# Patient Record
Sex: Male | Born: 1945
Health system: Southern US, Community
[De-identification: ages and names within clinical notes are randomized; demographics above are authoritative.]

## PROBLEM LIST (undated history)

## (undated) DIAGNOSIS — E785 Hyperlipidemia, unspecified: Secondary | ICD-10-CM

## (undated) DIAGNOSIS — M2550 Pain in unspecified joint: Secondary | ICD-10-CM

## (undated) DIAGNOSIS — I10 Essential (primary) hypertension: Secondary | ICD-10-CM

## (undated) DIAGNOSIS — M1990 Unspecified osteoarthritis, unspecified site: Secondary | ICD-10-CM

## (undated) DIAGNOSIS — H269 Unspecified cataract: Secondary | ICD-10-CM

## (undated) DIAGNOSIS — N4289 Other specified disorders of prostate: Secondary | ICD-10-CM

## (undated) DIAGNOSIS — M109 Gout, unspecified: Secondary | ICD-10-CM

## (undated) DIAGNOSIS — N189 Chronic kidney disease, unspecified: Secondary | ICD-10-CM

## (undated) DIAGNOSIS — E119 Type 2 diabetes mellitus without complications: Secondary | ICD-10-CM

## (undated) HISTORY — DX: Essential (primary) hypertension: I10

## (undated) HISTORY — DX: Gout, unspecified: M10.9

## (undated) HISTORY — PX: OTHER SURGICAL HISTORY: SHX169

## (undated) HISTORY — DX: Type 2 diabetes mellitus without complications: E11.9

## (undated) HISTORY — PX: HERNIA REPAIR: SHX51

---

## 1988-08-01 HISTORY — PX: LUMBAR FUSION: SHX111

## 1988-08-01 HISTORY — PX: REPLACEMENT TOTAL KNEE BILATERAL: SUR1225

## 1998-06-20 ENCOUNTER — Emergency Department (HOSPITAL_COMMUNITY): Admission: EM | Admit: 1998-06-20 | Discharge: 1998-06-20 | Payer: Self-pay | Admitting: Emergency Medicine

## 1998-08-14 ENCOUNTER — Encounter: Payer: Self-pay | Admitting: Orthopedic Surgery

## 1998-08-15 ENCOUNTER — Inpatient Hospital Stay (HOSPITAL_COMMUNITY): Admission: RE | Admit: 1998-08-15 | Discharge: 1998-08-17 | Payer: Self-pay | Admitting: Orthopedic Surgery

## 1998-10-08 ENCOUNTER — Emergency Department (HOSPITAL_COMMUNITY): Admission: EM | Admit: 1998-10-08 | Discharge: 1998-10-09 | Payer: Self-pay | Admitting: Emergency Medicine

## 1998-11-01 ENCOUNTER — Encounter: Payer: Self-pay | Admitting: Emergency Medicine

## 1998-11-01 ENCOUNTER — Emergency Department (HOSPITAL_COMMUNITY): Admission: EM | Admit: 1998-11-01 | Discharge: 1998-11-01 | Payer: Self-pay | Admitting: Emergency Medicine

## 1998-11-23 ENCOUNTER — Encounter: Payer: Self-pay | Admitting: Orthopedic Surgery

## 1998-11-27 ENCOUNTER — Encounter: Payer: Self-pay | Admitting: Orthopedic Surgery

## 1998-11-27 ENCOUNTER — Inpatient Hospital Stay (HOSPITAL_COMMUNITY): Admission: RE | Admit: 1998-11-27 | Discharge: 1998-12-02 | Payer: Self-pay | Admitting: Orthopedic Surgery

## 1998-12-01 ENCOUNTER — Encounter: Payer: Self-pay | Admitting: Orthopedic Surgery

## 1999-01-15 ENCOUNTER — Encounter: Payer: Self-pay | Admitting: Internal Medicine

## 1999-01-15 ENCOUNTER — Emergency Department (HOSPITAL_COMMUNITY): Admission: EM | Admit: 1999-01-15 | Discharge: 1999-01-15 | Payer: Self-pay | Admitting: Internal Medicine

## 1999-02-17 ENCOUNTER — Encounter: Payer: Self-pay | Admitting: Orthopedic Surgery

## 1999-02-25 ENCOUNTER — Encounter: Payer: Self-pay | Admitting: Orthopedic Surgery

## 1999-02-25 ENCOUNTER — Inpatient Hospital Stay (HOSPITAL_COMMUNITY): Admission: RE | Admit: 1999-02-25 | Discharge: 1999-03-03 | Payer: Self-pay | Admitting: Orthopedic Surgery

## 2001-03-27 ENCOUNTER — Emergency Department (HOSPITAL_COMMUNITY): Admission: EM | Admit: 2001-03-27 | Discharge: 2001-03-27 | Payer: Self-pay | Admitting: *Deleted

## 2001-04-08 ENCOUNTER — Emergency Department (HOSPITAL_COMMUNITY): Admission: EM | Admit: 2001-04-08 | Discharge: 2001-04-08 | Payer: Self-pay | Admitting: Internal Medicine

## 2002-06-08 ENCOUNTER — Encounter: Payer: Self-pay | Admitting: Family Medicine

## 2002-06-08 ENCOUNTER — Ambulatory Visit (HOSPITAL_COMMUNITY): Admission: RE | Admit: 2002-06-08 | Discharge: 2002-06-08 | Payer: Self-pay | Admitting: Family Medicine

## 2003-01-02 ENCOUNTER — Ambulatory Visit (HOSPITAL_COMMUNITY): Admission: RE | Admit: 2003-01-02 | Discharge: 2003-01-02 | Payer: Self-pay | Admitting: Family Medicine

## 2003-01-02 ENCOUNTER — Encounter: Payer: Self-pay | Admitting: Family Medicine

## 2003-01-30 ENCOUNTER — Ambulatory Visit (HOSPITAL_COMMUNITY): Admission: RE | Admit: 2003-01-30 | Discharge: 2003-01-30 | Payer: Self-pay | Admitting: Internal Medicine

## 2003-01-30 ENCOUNTER — Encounter: Payer: Self-pay | Admitting: Internal Medicine

## 2003-03-05 ENCOUNTER — Encounter: Payer: Self-pay | Admitting: Neurosurgery

## 2003-03-05 ENCOUNTER — Encounter: Admission: RE | Admit: 2003-03-05 | Discharge: 2003-03-05 | Payer: Self-pay | Admitting: Neurosurgery

## 2003-05-26 ENCOUNTER — Encounter
Admission: RE | Admit: 2003-05-26 | Discharge: 2003-08-24 | Payer: Self-pay | Admitting: Physical Medicine & Rehabilitation

## 2003-06-05 ENCOUNTER — Encounter (HOSPITAL_COMMUNITY)
Admission: RE | Admit: 2003-06-05 | Discharge: 2003-07-05 | Payer: Self-pay | Admitting: Physical Medicine & Rehabilitation

## 2003-07-10 ENCOUNTER — Ambulatory Visit (HOSPITAL_COMMUNITY)
Admission: RE | Admit: 2003-07-10 | Discharge: 2003-07-10 | Payer: Self-pay | Admitting: Physical Medicine & Rehabilitation

## 2003-10-09 ENCOUNTER — Encounter
Admission: RE | Admit: 2003-10-09 | Discharge: 2004-01-07 | Payer: Self-pay | Admitting: Physical Medicine & Rehabilitation

## 2003-12-29 ENCOUNTER — Emergency Department (HOSPITAL_COMMUNITY): Admission: EM | Admit: 2003-12-29 | Discharge: 2003-12-29 | Payer: Self-pay | Admitting: Emergency Medicine

## 2004-01-12 ENCOUNTER — Encounter
Admission: RE | Admit: 2004-01-12 | Discharge: 2004-04-11 | Payer: Self-pay | Admitting: Physical Medicine & Rehabilitation

## 2004-02-27 ENCOUNTER — Emergency Department (HOSPITAL_COMMUNITY): Admission: EM | Admit: 2004-02-27 | Discharge: 2004-02-27 | Payer: Self-pay | Admitting: Emergency Medicine

## 2004-04-19 ENCOUNTER — Encounter
Admission: RE | Admit: 2004-04-19 | Discharge: 2004-07-18 | Payer: Self-pay | Admitting: Physical Medicine & Rehabilitation

## 2004-04-20 ENCOUNTER — Ambulatory Visit: Payer: Self-pay | Admitting: Physical Medicine & Rehabilitation

## 2008-10-03 ENCOUNTER — Ambulatory Visit (HOSPITAL_COMMUNITY): Admission: RE | Admit: 2008-10-03 | Discharge: 2008-10-03 | Payer: Self-pay | Admitting: Urology

## 2008-10-03 ENCOUNTER — Encounter (INDEPENDENT_AMBULATORY_CARE_PROVIDER_SITE_OTHER): Payer: Self-pay | Admitting: Urology

## 2010-11-11 LAB — CBC
HCT: 39.1 % (ref 39.0–52.0)
Hemoglobin: 13.4 g/dL (ref 13.0–17.0)
MCHC: 34.1 g/dL (ref 30.0–36.0)
MCV: 84.6 fL (ref 78.0–100.0)
Platelets: 357 10*3/uL (ref 150–400)
RBC: 4.63 MIL/uL (ref 4.22–5.81)
RDW: 14.1 % (ref 11.5–15.5)
WBC: 6.8 10*3/uL (ref 4.0–10.5)

## 2010-11-11 LAB — URINALYSIS, ROUTINE W REFLEX MICROSCOPIC
Bilirubin Urine: NEGATIVE
Glucose, UA: NEGATIVE mg/dL
Hgb urine dipstick: NEGATIVE
Ketones, ur: NEGATIVE mg/dL
Nitrite: NEGATIVE
Protein, ur: NEGATIVE mg/dL
Specific Gravity, Urine: 1.03 (ref 1.005–1.030)
Urobilinogen, UA: 0.2 mg/dL (ref 0.0–1.0)
pH: 6 (ref 5.0–8.0)

## 2010-11-11 LAB — DIFFERENTIAL
Basophils Absolute: 0 10*3/uL (ref 0.0–0.1)
Basophils Relative: 1 % (ref 0–1)
Eosinophils Absolute: 0.1 10*3/uL (ref 0.0–0.7)
Eosinophils Relative: 2 % (ref 0–5)
Lymphocytes Relative: 17 % (ref 12–46)
Lymphs Abs: 1.2 10*3/uL (ref 0.7–4.0)
Monocytes Absolute: 0.5 10*3/uL (ref 0.1–1.0)
Monocytes Relative: 7 % (ref 3–12)
Neutro Abs: 5 10*3/uL (ref 1.7–7.7)
Neutrophils Relative %: 74 % (ref 43–77)

## 2010-11-11 LAB — BASIC METABOLIC PANEL
BUN: 21 mg/dL (ref 6–23)
CO2: 28 mEq/L (ref 19–32)
Calcium: 9 mg/dL (ref 8.4–10.5)
Chloride: 101 mEq/L (ref 96–112)
Creatinine, Ser: 0.93 mg/dL (ref 0.4–1.5)
GFR calc Af Amer: >60 mL/min (ref >60)
GFR calc non Af Amer: >60 mL/min (ref >60)
Glucose, Bld: 199 mg/dL — ABNORMAL HIGH (ref 70–99)
Potassium: 3.4 mEq/L — ABNORMAL LOW (ref 3.5–5.1)
Sodium: 141 mEq/L (ref 135–145)

## 2010-11-11 LAB — GLUCOSE, CAPILLARY: Glucose-Capillary: 96 mg/dL (ref 70–99)

## 2010-12-14 NOTE — Consult Note (Signed)
NAME:  ULMER, DEGEN NO.:  [id number]   MEDICAL RECORD NO.:  [id number]          PATIENT TYPE:  AMB   LOCATION:  DAY                           FACILITY:  [facility]   PHYSICIAN:  Ky Barban, M.D.DATE OF BIRTH:  1945/11/21   DATE OF CONSULTATION:  DATE OF DISCHARGE:                                 CONSULTATION   CHIEF COMPLAINT:  Elevated PSA.   Mr. Spickler is 65 years old gentleman being followed since December of  last year.  He has BPH with bladder neck obstruction.  Although he does  not have any symptoms of prostatism a cystoscopy was done.  He has  enlarged prostate with a slow urinary stream so I put him on Flomax,  repeated his PSA.  His PSA keeps rising, now it has gone up to 7.1.  Free PSA is only 10%.  I tried to do a biopsy in the office but he has  very tight anal sphincter, very uncomfortable, so I advised him to  undergo a prostate biopsy under anesthesia in the hospital for which he  is coming as outpatient.  I have explained the procedure limitation  complications, he understands.   PAST MEDICAL HISTORY:  Negative.   FAMILY HISTORY:  No history of prostate cancer.   PERSONAL HISTORY:  Does not smoke or drink.   REVIEW OF SYSTEMS:  Unremarkable.   EXAMINATION:  Blood pressure 134/84, temperature is normal.  CENTRAL NERVOUS SYSTEM:  Negative.  HEAD, NECK, ENT:  Negative.  CHEST:  Symmetrical.  HEART:  Regular sinus rhythm.  ABDOMEN:  Soft, flat.  Liver, spleen, kidneys are not palpable.  EXTERNAL GENITALIA:  Uncircumcised, meatus is adequate.  Testicles are  normal.  RECTAL:  Normal sphincter tone, no rectal mass, prostate 1 and 1/2 +,  smooth and firm.   IMPRESSION:  1. Elevated prostate-specific antigen.  2. Benign prostatic hypertrophy.   PLAN:  Transrectal needle biopsy of the prostate under anesthesia as  outpatient.      Ky Barban, M.D.  Electronically Signed     MIJ/MEDQ  D:  10/02/2008  T:  10/02/2008   Job:  [id number]

## 2010-12-14 NOTE — Op Note (Signed)
NAME:  Kevin Hooper, Kevin Hooper              ACCOUNT NO.:  [id number]   MEDICAL RECORD NO.:  [id number]          PATIENT TYPE:  AMB   LOCATION:  DAY                           FACILITY:  [facility]   PHYSICIAN:  Ky Barban, M.D.DATE OF BIRTH:  [birth year]   DATE OF PROCEDURE:  10/03/2008  DATE OF DISCHARGE:                               OPERATIVE REPORT   PREOPERATIVE DIAGNOSIS:  Elevated prostate-specific antigen.   POSTOPERATIVE DIAGNOSIS:  Elevated prostate-specific antigen.   PROCEDURE:  Transrectal multiple needle biopsy of the prostate.   ANESTHESIA:  General.   DESCRIPTION OF THE PROCEDURE:  The patient under general anesthesia in  lithotomy position with usual prep and drape.  A digital rectal  examination was done.  Prostate feels smooth and firm.  Then using a Tru-  Cut biopsy needle multiple biopsies from both sides of the prostate are  done.  No complication.  The patient left the operating room in  satisfactory condition.      Ky Barban, M.D.  Electronically Signed     MIJ/MEDQ  D:  10/03/2008  T:  10/04/2008  Job:  [id number]

## 2010-12-17 NOTE — Assessment & Plan Note (Signed)
MEDICAL RECORD NUMBER:  #[id number].   DATE OF BIRTH:  [birth year].   HISTORY OF PRESENT ILLNESS:  This 65 year old male with history of  rheumatoid arthritis previously on methotrexate who discontinued this on his  own because he did not want to have to avoid the sun while being on it.  He  has a history of bilateral knee replacements.  He has had recent risk  fracture on the right upper extremity, but this is healing.  He has major  complaints of bilateral wrist pain as well as bilateral foot pain.  His  wrist pain is more towards the ulnar stylus area.  His foot pain is more  lateral malleolus on the right and on the clavicular area on the left.   His pain is rated 9/10.   CURRENT MEDICATIONS:  1.  Allegra.  2.  Toprol.  3.  Flexeril.  4.  Arthrotec which he takes b.i.d.  5.  Nortriptyline 10 q.h.s. which has not helped her with sleep thus far.  6.  Ultracet 1 p.o. b.i.d.  7.  Lidoderm patch which he puts on painful areas as directed, on 12, off      12.   SOCIAL HISTORY:  Accompanied by wife.  No illicit drug use since going  through detox last summer.  My last UDS showed morphine as expected on  Avinza.  He was on that at the time, but no other illicit drugs.  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   REVIEW OF SYSTEMS:  Anxiety, depression, poor sleep, agitation, spasms in  his left hand and right leg.   PHYSICAL EXAMINATION:  VITAL SIGNS:  Blood pressure 143/82, pulse 93,  respirations 16, O2 saturation 98% on room air.  GENERAL APPEARANCE:  Gait is with a limp.  Affect is alert, appearance is  normal.  EXTREMITIES:  Wrist has minor degree of swelling and there is no erythema.  He does smell of Ben-Gay which he has applied liberally.   His knees show no evidence of swelling, his feet show no evidence of  swelling except that the right lateral malleolus has pain with passive  inversion of the foot.  He has  full strength in bilateral upper and lower  extremities.  His gait shows no evidence of toe drag or knee instability.  His fingers PIT, DIT and MCP showed no evidence of swelling.  He has no  joint deformities at this time other than the swelling in the wrist.  No  rheumatoid nodules appreciated.  Feet have no joint deformities that I can  appreciate.  He has no evidence of skin rash.   IMPRESSION:  1.  Rheumatoid arthritis with polyarthritis causing pain, mainly in      bilateral wrist at this time, and left mid foot area.  2.  Right ankle sprain.  Does not recall a specific injury, but this looks      like a sprain rather then joint swelling.  3.  History of right wrist fracture last summer.  This appears to have      healed.  4.  Pain management noted, will try to manage him without narcotics, i.e.,      Ultram and Arthrotec.  Will increase his Arthrotec t.i.d..  Will switch      him from the Ultracet to the Ultram at 50 mg two p.o. t.i.d. and      continue his Lidoderm patch.  We  may need to eventually put him back on      Avinza, but I would first like to see him evaluated by Rheumatology and      to see if there is any __________ agents that may be appropriate at this      time and only start him on stronger narcotic analgesics, if he has pain,      despite appropriate rheumatoid arthritis therapy.   PLAN:  1.  Will refer him to [facility].  He states that the      rheumatologist that used to see him here in [place] does not take      Medicaid anymore, that is Dr. Corliss Skains at [facility].  2.  I will see him back in two months.       AEK/MedQ  D:  05/18/2004 10:44:33  T:  05/18/2004 12:38:25  Job #:  [id number]   cc:   Kerry Kass, M.D.  [facility]   [address]  [facility] [place]  [place] [address]  Fax: [phone]

## 2010-12-17 NOTE — Assessment & Plan Note (Signed)
A 65 year old male with onset of neck in 1980s, last seen by me July 03, 2003. He has done a bit better since we increased his nortriptyline to 25  q.h.s. in terms of sleep. He can really tell when he does not take the  Arthrotec and does relatively well when he takes 75/200 one p.o. b.i.d. He  did increase on his Tylox 5/500 to q.i.d. per my order, and he has been on  the Flexeril 10 mg t.i.d.   He has had no further flank pain of any magnitude. Has had current twinges  in his low back area. He has not followed up with urology.   We reviewed his nuclear medicine bone scan which really showed no  abnormalities other than his total knees.   He continues to have right fifth digit numbness after hitting his elbow on  something about two to three months ago.   REVIEW OF SYSTEMS:  No bowel or bladder incontinence. Has not passed stones.   SOCIAL HISTORY:  Married. 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   Current pain level is 4/10, going up to 7/10 at times. Pain interference  score:  General activity 8, mood not assessed, walking ability 6, __________  7, relationship to other people 10, sleep 7, generalized 6. Pain areas  include left side of the neck, posterior neck, bilateral knees, feet, and  right hand which is more of a numbness.   PAST MEDICAL HISTORY:  High blood pressure, depression, reflux, heartburn.   PHYSICAL EXAMINATION:  VITAL SIGNS:  Blood pressure 139/101, pulse 98, O2  saturation 98% on room air.  GENERAL:  In no acute distress. Mood and affect appropriate but appears a  bit down.  MUSCULOSKELETAL:  Lower strength 5/5 bilateral upper and lower extremities.  Sensation is decreased, right fifth digit. His neck has full forward  flexion, and extension is approximately 25%, accompanied by increased pain,  end-range pain also with forward flexion, lateral bending, and rotation of  25% as well. His low back has 50% flexion, 25%  extension; extension once  again is more painful than flexion. His deep tendon reflexes are absent in  bilaterally knees, but he is status post bilateral total knee; 2+ bilateral  ankle, biceps, triceps, brachial radialis. He has full range of motion in  bilateral upper and lower extremities.   IMPRESSION:  1. Cervicalgia, cervical spondylosis without myelopathy. He may have some     cervical facet arthropathy as well.  2. Lumbar pain. May have facet arthropathy as well based on his pain with     extension.  3. Normal nuclear medicine bone scan.   PLAN:  1. Will continue current medications.  2. Discussed possibility of neck injections. He thinks he is doing     relatively well, so he would like to wait for a couple more months and     see how he does as he increases work activity.  3. I will see him back in two months.  4. Continue current medications which includes Tylox 5/500 one p.o. q.i.d.,     Flexeril 10 mg t.i.d., and Arthrotec 75/200 one p.o. b.i.d.      Erick Colace, M.D.   AEK/MedQ  D:  08/12/2003 09:58:12  T:  08/12/2003 10:44:53  Job #:  [id number]   cc:   Madelin Rear. Sherwood Gambler, M.D.  [address]  [place]  [place] [address]  Fax: [phone]   Reinaldo Meeker, M.D.  [address]  [place]  [place] [address]  Fax: [phone]

## 2010-12-17 NOTE — Assessment & Plan Note (Signed)
A 65 year old male with chronic neck pain.  He was last seen by me on  August 12, 2003.  Sleep wise he is doing well with his nortriptyline 25 mg.  He continues on Tylox one p.o. q.i.d. and has done relatively well with  this.  Certainly he is able to work full time.  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  He brings firewood from outside to  inside to his home.   He has had no further flank pain.  He has had no new medical problems in the  intervening time.   MEDICATIONS:  1. Allegra 180 mg one p.o. daily.  2. Toprol XL 50 mg daily.  3. Flexeril 10 mg b.i.d.  4. Arthrotec 75/200 mg one p.o. b.i.d.  5. Nexium 40 mg p.o. daily.  6. __________ 5/500 mg one p.o. q.i.d.  7. Nortriptyline 10 mg p.o. q.h.s.   REVIEW OF SYSTEMS:  No bowel or bladder incontinence.  Positive for high  blood pressure.   PAST MEDICAL HISTORY:  Significant for:  1. Rheumatoid arthritis.  2. Kidney stones.  3. Hypertension.  He is on Toprol for that.   ALLERGIES:  None known.   PHYSICAL EXAMINATION:  The blood pressure is 149/106, pulse 101, respiratory  rate 16, O2 saturation 97% on room air.  Gait is stiff when he first gets  up.  No __________ affect and appearance normal.   The back has no tenderness to palpation.  The neck has no tenderness to  palpation.  He has forward flexion to approximately 50%, lateral rotation  and bending 50%, and extension about 50%, which is improved compared to last  visit.  He has decreased sensation in the right fifth digit, otherwise  intact bilateral upper extremities.  Deep tendon reflexes are 2 in bilateral  ankles, biceps, triceps, and brachial radialis.  He is status post bilateral  total knees with 0/5 reflexes there.  He has some stiffness in his  shoulders, but is able to get full range of motion on external and internal  rotation, but he does this very slowly.  He has no pain to palpation in the  upper extremities.  He has normal  strength in bilateral upper and lower  extremities.   IMPRESSION:  1. Cervicalgia, cervical spondylosis without myelopathy, and some cervical     facet arthropathy.  This could be related to his rheumatoid arthritis.  2. Lumbar facet arthropathy.  This is most likely degenerative joint     disease.  3. Right ulnar neuropathy due to contusing of his right elbow several months     ago.  He states it is generally getting better.   RECOMMENDATIONS:  I have reviewed his pain medications with him and have  recommended that his Tylox be changed to Avinza 30 mg p.o. daily.  This  should improve his efficacy based on a more smooth relief pattern.  Will  also continue his Flexeril 10 mg t.i.d. and Arthrotec 75/200 mg one p.o.  b.i.d.  He should not be on any other nonsteroidal anti-inflammatories.   I will see him back in one month to review medications.  No need for neck  injection at this point.      Erick Colace, M.D.   AEK/MedQ  D:  10/13/2003 17:31:51  T:  10/13/2003 20:04:01  Job #:  [id number]   cc:   Madelin Rear. Sherwood Gambler, M.D.  [address]  [place]  [place] [address]  Fax:  [phone]   Reinaldo Meeker, M.D.  [address]  [place]  [place] [address]  Fax: [phone]

## 2010-12-17 NOTE — Assessment & Plan Note (Signed)
This is a 65 year old male, onset of neck pain in the 78s.  Last seen by  me on May 27, 2003.  He also has had low back stiffness.  We stopped the  Soma, discontinued the Celebrex, maintained him on Arthrotec and Flexeril,  and he does not remember his Arthrotec dose.  I do not have it written down.  He got this from another doctor, but he is now out of it.  He has been on  oxycodone 5 mg three to four times a day.  Nortriptyline started 10 q.h.s.  This has been partially helpful.   INTERVAL HISTORY:  Positive for what sounds like kidney stones.  He had left  flank pain.  States that he has had kidney stones twice in the past.  He has  had what sounds like abdominal ultrasound or kidney ultrasound as well as CT  of his abdomen and pelvis.  He is not  sure whether he saw a urologist, but  he would call back to double check on this.  He has also in the intervening  time had some right fifth digit numbness, and he remembers hitting his elbow  on something.   REVIEW OF SYSTEMS:  No bowel or bladder incontinence.  Has not passed a  stone.  Has been straining his urines.   SOCIAL HISTORY:  He is married.  Not working.  Last worked about a year ago.   EXAM:  VITAL SIGNS:  Blood pressure 124/87, pulse 92, O2 saturation 97%.  NEUROLOGIC:  Gait is antalgic.  Affect is alert.  Appearance is normal.  Motor strength is 5/5 bilateral upper and lower extremities.  Sensation is  reduced right fifth digit compared to the left.  Back has some tenderness  along the left 12th rib.  Some tenderness inferior to this.  Some pain when  leaning towards the right side.  His overall supine range of motion is  approximately 50% on forward flexion and extension, lateral rotation, and  bending.   IMPRESSION:  1. Cervical spondylosis and chronic neck pain, improved with some physical     therapy.  2. Low back stiffness with increased pain left flank.  Question if this     renal calculi.  He does have some  tenderness right over the 12th rib.     Will need to investigate this further with a bone scan.   PLAN:  1. Will increase his nortriptyline 25 q.h.s.  2. Tylox 5/500 three times daily to q.i.d.  3  Arthrotec 75/200, 1 p.o. b.i.d.  4  Flexeril 10 mg p.o. three times daily.  1. Continue physical therapy.  2. Encouraged the patient to follow up with urology in regards to left flank     pain.   Consider conversion to longer-acting narcotic analgesic depending on how he  does the next time I see him.  Consider trigger points if neurologic workup  negative and bone scan is negative.      Erick Colace, M.D.   AEK/MedQ  D:  07/03/2003 17:59:24  T:  07/04/2003 05:20:10  Job #:  [id number]

## 2010-12-17 NOTE — Assessment & Plan Note (Signed)
This is a 65 year old male with rheumatoid arthritis and chronic neck pain.  He was last seen by me on October 13, 2003.  At that time I switched him from  Tylox to Avinza.  He does not note any significant improvement with the  switch from Tylox 5/500 q.i.d. to Avinza 30 daily.  He does complain of his  joints aching in his hands as well as some neck pain and bilateral foot  pain.  He has had severe arthritic pain in the past in his feet and has  taken methotrexate for this but has not taken any of this for a while.  Regarding his hand pain he points to the carpometacarpal joints bilaterally.  He rates his pain averaging 5 out of 10 going from 4 to 8.   PAST SURGICAL HISTORY:  Left knee replacement August 09, 1999.  Right knee  replacement January 09, 2000.  Lower back surgery in September of 2001.   Other past medical history is cervical spondylosis without significant  instability.  Also a history of costochondritis.   His last physical therapy was in December, 2004 which has helped loosen up  his neck.   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   CURRENT MEDICATIONS:  Allegra 180 p.o. daily, Toprol XL 50 mg p.o. daily,  Flexeril 10 p.o. b.i.d., Arthrotec 75/200 b.i.d., Nexium 40 mg p.o.,  nortriptyline 25 p.o. q.h.s., Avinza 30 mg p.o. q.a.m.,   REVIEW OF SYSTEMS:  No suicidal thoughts.  He is sleeping okay.   SOCIAL HISTORY:  Smokes.  Married, lives with his wife.   PHYSICAL EXAMINATION:  VITAL SIGNS: Blood pressure 138/100, pulse 60,  respiratory rate 16, O2 saturation 97% on room air.  GENERAL:  Gait is of sort of a shuffle when he first gets up.  Alert,  appears normal.  No acute distress, affect appropriate.  NECK:  Has a 35% range forward flexion and 50% extension, 50% lateral  bending and rotation.  MUSCULOSKELETAL:  The back has no tenderness to palpation of the lumbar  spine.  Borderline cervical paraspinal's and upper trapezius.  Deep tendon  reflexes are 3+ bilaterally in upper and lower extremities with exception of  ankles which are 1+.  His strength is 5/5 bilaterally deltoid as well as hip  flexion and knee extension, ankle dorsiflexion.  He has no tenderness to  palpation of his shoulders, elbows or wrists.  No joint swelling in his  hands.  Bilateral knees have valgus deformities.  He is status post total  knees bilaterally and does have some compensatory valgus at the ankles.  Gait is without evidence of toe dragging, knee instability.   IMPRESSION:  1. Cervicalgia.  2. More diffuse arthritic pain, may be related to his rheumatoid versus     superimposed osteoarthritis.  3. Lumbar facet arthropathy.   PLAN:  1. Will start on a Prednisone taper, start out with 40 working down to 10     over the course of 12 days.  He will hold his Arthrotec during that time.  2. Continue Avinza but add on Ultracet 1 p.o. b.i.d. for break through.   PLAN:  1. I will see him back in approximately 1 month to assess medications and     decide whether to increase Avinza if is still not having adequate pain     relief versus going back to Tylox.  2. Continue Flexeril 10 t.i.d.  3. Restart Arthrotec after done with Prednisone.  Erick Colace, M.D.   AEK/MedQ  D:  11/06/2003 17:18:36  T:  11/06/2003 18:31:29  Job #:  [id number]   cc:   Madelin Rear. Sherwood Gambler, M.D.  [address]  [place]  [place] [address]  Fax: [phone]   Reinaldo Meeker, M.D.  [address]  [place]  [place] [address]  Fax: [phone]
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
## 2010-12-17 NOTE — Assessment & Plan Note (Signed)
REASON FOR VISIT:  A 65 year old male with rheumatoid arthritis and chronic  neck pain.  He has been on Avinza and ran out a couple of days ago, had some  diarrhea along with this, but no other withdrawal symptoms.  He did notice  an increase in his pain after he came off the Avinza.  Last visit we put him  on a prednisone burst and taper, 12-day, held his Arthrotec during that  time.  The patient did not restart it in the interval time.  We did add  Ultracet one p.o. b.i.d. for breakthrough which has been minimally helpful.   He complains of left and right foot pain at the current time, notes a left  great toe injury last year.  He states that his left toe has been swollen as  well.   No new medical issues in the interval time.   PAST SURGICAL HISTORY:  1. Left knee replacement in 2001 and right knee replacement in 2001, 6     months apart.  2. Lower back surgery September 2001.   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   CURRENT MEDICATIONS:  1. Avinza 30 q.a.m. - ran out 2 days ago.  2. Arthrotec 75/200 b.i.d. - did not restart this after prednisone.  3. Ultracet one p.o. b.i.d.  4. Flexeril 10 p.o. b.i.d.  5. Toprol-XL 50 p.o. daily.  6. Allegra 180 p.o. daily.   REVIEW OF SYSTEMS:  Poor sleep.  No suicidal thoughts.  Chest pain.  History  of costochondritis.  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  His pain is averaging 6/10.  Pain  diagram shows right hand, left knee, right foot - though he mentions his  left toe as well, low back.   EXAMINATION:  VITAL SIGNS:  Blood pressure 133/62, pulse 76, O2 saturation  96% on room air.  GENERAL:  No acute distress, mood and affect appropriate.  MUSCULOSKELETAL:  Motor strength is 5/5 bilateral deltoid, biceps, triceps,  grip, as well as hip flexion, knee extension, and ankle dorsiflexion.  He  has good range of motion at the hips, ankles, and knees.  His right  foot has  good pulses, no skin breakdown, full range of motion, some tenderness at the  metatarsal heads.  Left foot has a swollen distal phalanx of the great toe.  His toenail is yellowish and cracked.  I can express purulent material from  underneath his toenail.  No odor to it.  He has no swelling proximal to the  DIP.  His pulses are good.   IMPRESSION:  1. Rheumatoid arthritis status post bilateral knee replacements, has some     arthritic pain right foot as well as the hand.  2. Left great toe infection, appears to be related to a toenail that had     trauma to it quite remotely.   RECOMMENDATIONS:  1. Will send him over to the foot center.  I believe he will need to have     debridement of his left great toe and likely some antibiotics though I     think the debridement is the most important at this point.  2. In terms of his pain management, his right foot has some metatarsalgia     and may benefit from Lidoderm patch.  3. Resume Arthrotec 75/200 b.i.d.  4. Increase Ultracet to two p.o. b.i.d.  5. Continue Avinza 30 mg p.o. daily.  6.  I have discussed referral to a rheumatologist.  He states he has not seen     one in the past but has been on methotrexate in the past.  He may benefit     from reevaluation but will hold off until he has treatment of his left     foot through the foot center.   I will see him back in 1 month.      Erick Colace, M.D.   AEK/MedQ  D:  12/09/2003 10:47:08  T:  12/09/2003 11:35:40  Job #:  [id number]

## 2010-12-17 NOTE — Assessment & Plan Note (Signed)
MEDICAL RECORD #[id number]   CHIEF COMPLAINT:  A 65 year old male with rheumatoid arthritis and chronic  neck pain.  Has been on Avinza with good result, ran out about 3 days ago,  had no significant withdrawal symptoms.  He did notice increase in pain  after he came off Avinza.  He was restarted on Arthrotec last visit.  Ultracet two p.o. b.i.d. were added for breakthrough pain.   INTERVAL HISTORY:  I asked him to go to the foot center to follow up on left  great toe swelling and some discharge under the nail.  He instead went to  the [facility] ED and received antibiotics and x-ray, stated it was fractured  although he does not recall actually injuring it except for last year.  He  was given a cast boot and as he reports, one dose of Vicodin in the ED; no  prescription given.   SOCIAL HISTORY:  Wife has been getting treatment for shoulder pain.  He is  restarting a job at the end of this month as a __________.   CURRENT MEDICATIONS:  1. Avinza 30 mg p.o. q.a.m.  2. Arthrotec 75/200 b.i.d.  3. Ultracet two p.o. b.i.d.  4. Flexeril 10 p.o. b.i.d.  5. Toprol-XL 50 mg daily.  6. Allegra 180 p.o. daily.   REVIEW OF SYSTEMS:  Getting over bronchitis.  His costochondritis has flared  up.  Pain averaging 4 but going up to 5/10.  This is without working the  last 2 months.   Review of systems:  Depression, poor sleep, reflux, heartburn, swelling in  legs.   EXAMINATION:  Blood pressure 130/95, pulse 91, respirations 18, O2  saturation 95% on room air.  Motor strength is 5/5 bilateral deltoid,  biceps, triceps, grip, as well as 4/5 hip flexion, 5 knee extension, 5 ankle  dorsiflexion.  His lower extremities show bilateral total knee scars.  Left  great toe shows no evidence of swelling, minor tenderness to palpation at  the MTP, no drainage.  His metatarsal heads are nonpainful to palpation.  He  has no dysvascular changes in his lower extremities.  Toes are warm.  His  back has no  tenderness to palpation.  He has good range of motion at the  hips.  Some crepitus at the knees.   IMPRESSION:  Rheumatoid arthritis with chronic neck as well as chronic  intermittent costochondritis and low back pain.   PLAN:  1. Will continue Avinza.  I have told him to call back if he runs out.  2. I went over the controlled substance agreement.  The patient not to get     controlled substances, even from ER while he is in the ER without     permission.  3. Continue Arthrotec 75/200 b.i.d.  4. Ultracet two p.o. b.i.d.  5. I will see him back in 1 month.  Consider rheumatology consult.    Erick Colace, M.D.   AEK/MedQ  D:  01/13/2004 10:33:03  T:  01/13/2004 11:44:30  Job #:  [id number]

## 2013-08-01 HISTORY — PX: COLON RESECTION: SHX5231

## 2016-04-28 ENCOUNTER — Ambulatory Visit (INDEPENDENT_AMBULATORY_CARE_PROVIDER_SITE_OTHER): Payer: Medicare Other | Admitting: Neurology

## 2016-04-28 ENCOUNTER — Encounter: Payer: Self-pay | Admitting: Neurology

## 2016-04-28 VITALS — BP 177/103 | HR 76 | Ht 68.5 in | Wt 204.5 lb

## 2016-04-28 DIAGNOSIS — R202 Paresthesia of skin: Secondary | ICD-10-CM | POA: Diagnosis not present

## 2016-04-28 NOTE — Patient Instructions (Signed)
   We will check MRI of the brain and a carotid doppler study to look a blood circulation to the head. Stay on the aspirin.

## 2016-04-28 NOTE — Progress Notes (Signed)
Reason for visit: Left sided numbness  Referring physician: Dr. Darryl Nestle is a 70 y.o. male  History of present illness:  Mr. Ruffolo is a 70 year old right-handed white male with a history of hypertension who comes to this office for an evaluation of onset of left-sided numbness that began 3 weeks ago. The patient has had some numbness in the hands that dates back several years, but he woke up one morning 3 weeks ago with numbness of the left face, arm, and leg. The numbness has persisted, he has also noted some mild gait instability since onset of the numbness. He has not had any falls. He denies any discomfort in the neck or low back, he has had prior lumbosacral spine surgery. The patient denies issues controlling the bowels or the bladder. He denies any true weakness of the extremities. He has not had any change in speech or swallowing, he denies headache, he denies any vision changes. He is on low-dose aspirin therapy. He was seen by his primary care physician who ordered MRI of the brain, but his insurance company denied the study. The patient is sent to this office for further evaluation.   Past Medical History:  Diagnosis Date  . Hypertension     Past Surgical History:  Procedure Laterality Date  . COLON RESECTION  2015  . LUMBAR FUSION  1990  . OTHER SURGICAL HISTORY Left    Arm s/p accident  . REPLACEMENT TOTAL KNEE BILATERAL Bilateral 1990    Family History  Problem Relation Age of Onset  . COPD Father   . Throat cancer Father   . Cancer Brother     Social history:  reports that he quit smoking about 24 years ago. He has never used smokeless tobacco. He reports that he drinks alcohol. He reports that he does not use drugs.  Medications:  Prior to Admission medications   Medication Sig Start Date End Date Taking? Authorizing Provider  albuterol (PROVENTIL HFA;VENTOLIN HFA) 108 (90 Base) MCG/ACT inhaler Inhale into the lungs every 6 (six) hours as  needed for wheezing or shortness of breath.   Yes Historical Provider, MD  aspirin EC 81 MG tablet Take 81 mg by mouth daily.   Yes Historical Provider, MD  lisinopril (PRINIVIL,ZESTRIL) 10 MG tablet Take 10 mg by mouth daily. 04/05/16  Yes Historical Provider, MD  metoprolol succinate (TOPROL-XL) 50 MG 24 hr tablet TAKE TWO (2) TABLETS BY MOUTH DAILY. 04/05/16  Yes Historical Provider, MD     No Known Allergies  ROS:  Out of a complete 14 system review of symptoms, the patient complains only of the following symptoms, and all other reviewed systems are negative.  Joint pain, achy muscles Numbness  Blood pressure (!) 177/103, pulse 76, height 5' 8.5" (1.74 m), weight 204 lb 8 oz (92.8 kg).  Physical Exam  General: The patient is alert and cooperative at the time of the examination. The patient is moderately obese.  Eyes: Pupils are equal, round, and reactive to light. Discs are flat bilaterally.  Neck: The neck is supple, no carotid bruits are noted.  Respiratory: The respiratory examination is clear.  Cardiovascular: The cardiovascular examination reveals a regular rate and rhythm, no obvious murmurs or rubs are noted.  Skin: Extremities are without significant edema.  Neurologic Exam  Mental status: The patient is alert and oriented x 3 at the time of the examination. The patient has apparent normal recent and remote memory, with an apparently normal  attention span and concentration ability.  Cranial nerves: Facial symmetry is present. There is good sensation of the face to pinprick and soft touch on the right, slightly decreased on the left. The strength of the facial muscles and the muscles to head turning and shoulder shrug are normal bilaterally. Speech is well enunciated, no aphasia or dysarthria is noted. Extraocular movements are full. Visual fields are full. The tongue is midline, and the patient has symmetric elevation of the soft palate. No obvious hearing deficits are  noted.  Motor: The motor testing reveals 5 over 5 strength of all 4 extremities. Good symmetric motor tone is noted throughout.  Sensory: Sensory testing is intact to pinprick, soft touch, vibration sensation, and position sense on all 4 extremities, with exception of some decrease in pinprick sensation on the left arm, some decrease in vibration sensation on the left leg.. No evidence of extinction is noted.  Coordination: Cerebellar testing reveals good finger-nose-finger and heel-to-shin bilaterally.  Gait and station: Gait is slightly wide-based. Tandem gait is unsteady. Romberg is negative. No drift is seen.  Reflexes: Deep tendon reflexes are symmetric and normal bilaterally. Toes are downgoing bilaterally.   Assessment/Plan:  1. Left-sided numbness, probable stroke event  The patient likely sustained a small thalamic stroke in the right brain. The patient will need to be evaluated for this issue. MRI of the brain will be ordered again, carotid Doppler study will be obtained. The patient will stay on aspirin. He will be contacted with the results of the above studies. If a stroke is evident, a 2-D echocardiogram also be done.  Jill Alexanders MD 04/28/2016 8:56 AM  [facility] [address] [place], [place] [address]  Phone [phone] Fax [phone]

## 2016-05-02 ENCOUNTER — Telehealth: Payer: Self-pay | Admitting: Neurology

## 2016-05-02 NOTE — Telephone Encounter (Signed)
Patient ready to be scheduled for Doppler No PA needed. Thanks [employer]

## 2016-05-11 ENCOUNTER — Ambulatory Visit (INDEPENDENT_AMBULATORY_CARE_PROVIDER_SITE_OTHER): Payer: Medicare Other

## 2016-05-11 DIAGNOSIS — R202 Paresthesia of skin: Secondary | ICD-10-CM

## 2016-05-18 ENCOUNTER — Telehealth: Payer: Self-pay | Admitting: *Deleted

## 2016-05-18 MED ORDER — GABAPENTIN 100 MG PO CAPS: 100.0000 mg | ORAL_CAPSULE | Freq: Three times a day (TID) | ORAL | 1 refills | Status: DC

## 2016-05-18 NOTE — Telephone Encounter (Signed)
I spoke to wife who had called for her husband.  He has been having L hip pain the last 3 wks, but pain has increased the last 2 days.  Is asking for referral to [facility] ( Dr. Gladstone Lighter) (has been to them before).  I relayed that Dr. Jannifer Franklin out of the office today.  She may also contact pcp (Dr. Manuella Ghazi) as well.  Pt is scheduled for MRI brain 05-30-16 for left leg numbness.

## 2016-05-18 NOTE — Telephone Encounter (Signed)
I called the patient. The left hip pain is part of the left sided numbness that he was seen for before. There is no hip pain with standing, it bothers him with sitting primarily with a numbness uncomfortable sensation. I will call in gabapentin, I do not think this is an orthopedic problem.

## 2016-05-19 ENCOUNTER — Telehealth: Payer: Self-pay | Admitting: Neurology

## 2016-05-19 NOTE — Telephone Encounter (Signed)
I called patient, talk with the wife, the carotid Doppler study was unremarkable.

## 2016-05-30 ENCOUNTER — Ambulatory Visit
Admission: RE | Admit: 2016-05-30 | Discharge: 2016-05-30 | Disposition: A | Discharge status: Home / Self Care | Payer: Medicare Other | Source: Ambulatory Visit | Attending: Neurology | Admitting: Neurology

## 2016-05-30 DIAGNOSIS — R202 Paresthesia of skin: Secondary | ICD-10-CM

## 2016-05-31 ENCOUNTER — Telehealth: Payer: Self-pay | Admitting: Neurology

## 2016-05-31 NOTE — Telephone Encounter (Signed)
I called the patient the MRI the brain shows minimal small vessel changes. There is no evidence of a stroke that would explain his current symptoms. We will go on to get the carotid Doppler study. He will remain on aspirin.    MRI brain 05/30/16:  IMPRESSION:  This MRI of the brain without contrast shows the following: 1.    There are no acute strokes noted. 2.     There are several microhemorrhages in the hemispheres and in the left cerebellar cerebellar hemisphere. This is a nonspecific finding and probably due to chronic microvascular ischemic change.   Early amyloid angiopathy cannot be ruled out. 3.    Mild age-related cortical atrophy.

## 2016-06-13 ENCOUNTER — Other Ambulatory Visit (HOSPITAL_COMMUNITY): Payer: Self-pay | Admitting: Orthopedic Surgery

## 2016-06-13 DIAGNOSIS — M5442 Lumbago with sciatica, left side: Secondary | ICD-10-CM

## 2016-06-17 ENCOUNTER — Ambulatory Visit (HOSPITAL_COMMUNITY)
Admission: RE | Admit: 2016-06-17 | Discharge: 2016-06-17 | Disposition: A | Discharge status: Home / Self Care | Payer: Medicare Other | Source: Ambulatory Visit | Attending: Orthopedic Surgery | Admitting: Orthopedic Surgery

## 2016-06-17 DIAGNOSIS — M4802 Spinal stenosis, cervical region: Secondary | ICD-10-CM | POA: Insufficient documentation

## 2016-06-17 DIAGNOSIS — M5442 Lumbago with sciatica, left side: Secondary | ICD-10-CM

## 2016-08-01 DIAGNOSIS — N4289 Other specified disorders of prostate: Secondary | ICD-10-CM

## 2016-08-01 HISTORY — DX: Other specified disorders of prostate: N42.89

## 2016-08-03 DIAGNOSIS — G5603 Carpal tunnel syndrome, bilateral upper limbs: Secondary | ICD-10-CM | POA: Diagnosis not present

## 2016-08-15 DIAGNOSIS — G5603 Carpal tunnel syndrome, bilateral upper limbs: Secondary | ICD-10-CM | POA: Diagnosis not present

## 2016-08-29 DIAGNOSIS — G5603 Carpal tunnel syndrome, bilateral upper limbs: Secondary | ICD-10-CM | POA: Diagnosis not present

## 2016-09-05 DIAGNOSIS — G5603 Carpal tunnel syndrome, bilateral upper limbs: Secondary | ICD-10-CM | POA: Diagnosis not present

## 2016-09-08 DIAGNOSIS — G5603 Carpal tunnel syndrome, bilateral upper limbs: Secondary | ICD-10-CM | POA: Diagnosis not present

## 2016-09-26 DIAGNOSIS — I10 Essential (primary) hypertension: Secondary | ICD-10-CM | POA: Diagnosis not present

## 2016-09-26 DIAGNOSIS — Z7189 Other specified counseling: Secondary | ICD-10-CM | POA: Diagnosis not present

## 2016-09-26 DIAGNOSIS — J439 Emphysema, unspecified: Secondary | ICD-10-CM | POA: Diagnosis not present

## 2016-09-26 DIAGNOSIS — Z1389 Encounter for screening for other disorder: Secondary | ICD-10-CM | POA: Diagnosis not present

## 2016-09-26 DIAGNOSIS — R350 Frequency of micturition: Secondary | ICD-10-CM | POA: Diagnosis not present

## 2016-09-26 DIAGNOSIS — Z0000 Encounter for general adult medical examination without abnormal findings: Secondary | ICD-10-CM | POA: Diagnosis not present

## 2016-09-26 DIAGNOSIS — Z6832 Body mass index (BMI) 32.0-32.9, adult: Secondary | ICD-10-CM | POA: Diagnosis not present

## 2016-09-26 DIAGNOSIS — F329 Major depressive disorder, single episode, unspecified: Secondary | ICD-10-CM | POA: Diagnosis not present

## 2016-09-26 DIAGNOSIS — E7800 Pure hypercholesterolemia, unspecified: Secondary | ICD-10-CM | POA: Diagnosis not present

## 2016-09-26 DIAGNOSIS — Z1211 Encounter for screening for malignant neoplasm of colon: Secondary | ICD-10-CM | POA: Diagnosis not present

## 2016-09-26 DIAGNOSIS — R5383 Other fatigue: Secondary | ICD-10-CM | POA: Diagnosis not present

## 2016-09-26 DIAGNOSIS — Z299 Encounter for prophylactic measures, unspecified: Secondary | ICD-10-CM | POA: Diagnosis not present

## 2016-09-27 DIAGNOSIS — Z79899 Other long term (current) drug therapy: Secondary | ICD-10-CM | POA: Diagnosis not present

## 2016-09-27 DIAGNOSIS — E7800 Pure hypercholesterolemia, unspecified: Secondary | ICD-10-CM | POA: Diagnosis not present

## 2016-09-27 DIAGNOSIS — Z125 Encounter for screening for malignant neoplasm of prostate: Secondary | ICD-10-CM | POA: Diagnosis not present

## 2016-09-27 DIAGNOSIS — R5383 Other fatigue: Secondary | ICD-10-CM | POA: Diagnosis not present

## 2016-09-29 DIAGNOSIS — F329 Major depressive disorder, single episode, unspecified: Secondary | ICD-10-CM | POA: Diagnosis not present

## 2016-09-29 DIAGNOSIS — Z299 Encounter for prophylactic measures, unspecified: Secondary | ICD-10-CM | POA: Diagnosis not present

## 2016-09-29 DIAGNOSIS — E7800 Pure hypercholesterolemia, unspecified: Secondary | ICD-10-CM | POA: Diagnosis not present

## 2016-09-29 DIAGNOSIS — R972 Elevated prostate specific antigen [PSA]: Secondary | ICD-10-CM | POA: Diagnosis not present

## 2016-09-29 DIAGNOSIS — N182 Chronic kidney disease, stage 2 (mild): Secondary | ICD-10-CM | POA: Diagnosis not present

## 2016-09-29 DIAGNOSIS — I10 Essential (primary) hypertension: Secondary | ICD-10-CM | POA: Diagnosis not present

## 2016-09-29 DIAGNOSIS — J439 Emphysema, unspecified: Secondary | ICD-10-CM | POA: Diagnosis not present

## 2016-09-29 DIAGNOSIS — Z6832 Body mass index (BMI) 32.0-32.9, adult: Secondary | ICD-10-CM | POA: Diagnosis not present

## 2016-09-29 DIAGNOSIS — Z713 Dietary counseling and surveillance: Secondary | ICD-10-CM | POA: Diagnosis not present

## 2016-09-29 DIAGNOSIS — E1122 Type 2 diabetes mellitus with diabetic chronic kidney disease: Secondary | ICD-10-CM | POA: Diagnosis not present

## 2016-09-29 DIAGNOSIS — Z87891 Personal history of nicotine dependence: Secondary | ICD-10-CM | POA: Diagnosis not present

## 2016-09-29 DIAGNOSIS — E669 Obesity, unspecified: Secondary | ICD-10-CM | POA: Diagnosis not present

## 2016-10-06 DIAGNOSIS — G5602 Carpal tunnel syndrome, left upper limb: Secondary | ICD-10-CM | POA: Diagnosis not present

## 2016-10-06 DIAGNOSIS — G5601 Carpal tunnel syndrome, right upper limb: Secondary | ICD-10-CM | POA: Diagnosis not present

## 2016-10-24 DIAGNOSIS — J439 Emphysema, unspecified: Secondary | ICD-10-CM | POA: Diagnosis not present

## 2016-10-24 DIAGNOSIS — N182 Chronic kidney disease, stage 2 (mild): Secondary | ICD-10-CM | POA: Diagnosis not present

## 2016-10-24 DIAGNOSIS — Z6831 Body mass index (BMI) 31.0-31.9, adult: Secondary | ICD-10-CM | POA: Diagnosis not present

## 2016-10-24 DIAGNOSIS — I10 Essential (primary) hypertension: Secondary | ICD-10-CM | POA: Diagnosis not present

## 2016-10-24 DIAGNOSIS — Z713 Dietary counseling and surveillance: Secondary | ICD-10-CM | POA: Diagnosis not present

## 2016-10-24 DIAGNOSIS — E7800 Pure hypercholesterolemia, unspecified: Secondary | ICD-10-CM | POA: Diagnosis not present

## 2016-10-24 DIAGNOSIS — E1122 Type 2 diabetes mellitus with diabetic chronic kidney disease: Secondary | ICD-10-CM | POA: Diagnosis not present

## 2016-10-24 DIAGNOSIS — Z87891 Personal history of nicotine dependence: Secondary | ICD-10-CM | POA: Diagnosis not present

## 2016-10-24 DIAGNOSIS — Z299 Encounter for prophylactic measures, unspecified: Secondary | ICD-10-CM | POA: Diagnosis not present

## 2016-10-24 DIAGNOSIS — F329 Major depressive disorder, single episode, unspecified: Secondary | ICD-10-CM | POA: Diagnosis not present

## 2016-10-25 DIAGNOSIS — E11319 Type 2 diabetes mellitus with unspecified diabetic retinopathy without macular edema: Secondary | ICD-10-CM | POA: Diagnosis not present

## 2016-11-11 DIAGNOSIS — G5602 Carpal tunnel syndrome, left upper limb: Secondary | ICD-10-CM | POA: Diagnosis not present

## 2016-11-22 IMAGING — MR MR CERVICAL SPINE W/O CM
4 of 5 series · 14 of 48 positions shown · non-contrast
Comparison: None.

CLINICAL DATA: Neck pain with left-sided radiculopathy and numbness
for 2 months.

EXAM:
MRI CERVICAL SPINE WITHOUT CONTRAST
TECHNIQUE: Multiplanar, multisequence MR imaging of the cervical spine was
performed. No intravenous contrast was administered.

[Series 3: T2 · sagittal · 3.0mm · 0.47mm/px · 5 of 13 slices shown (1 of 2)]
[im 1/13]
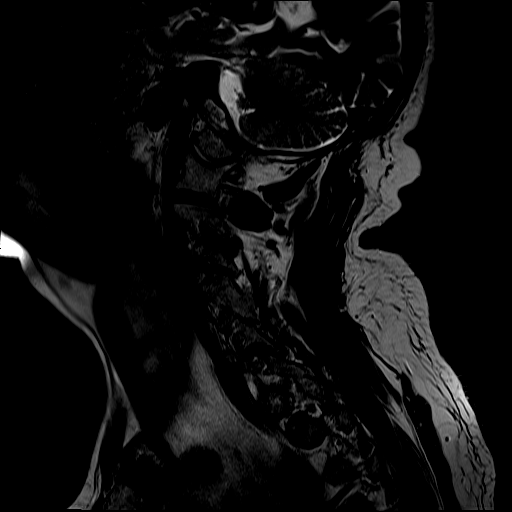
[im 4/13]
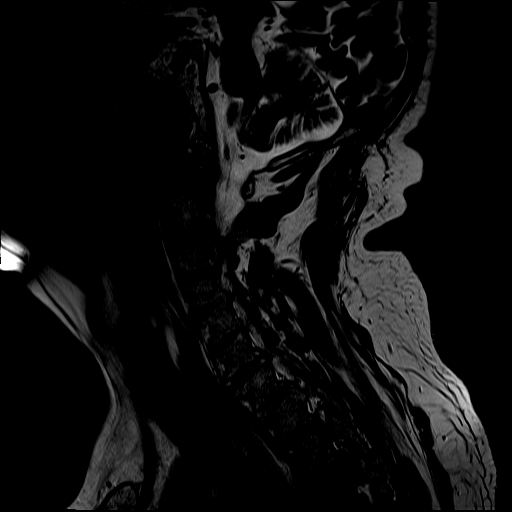
[im 7/13]
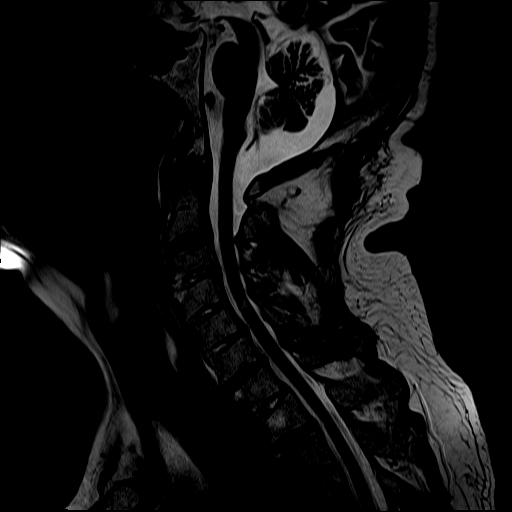
[im 10/13]
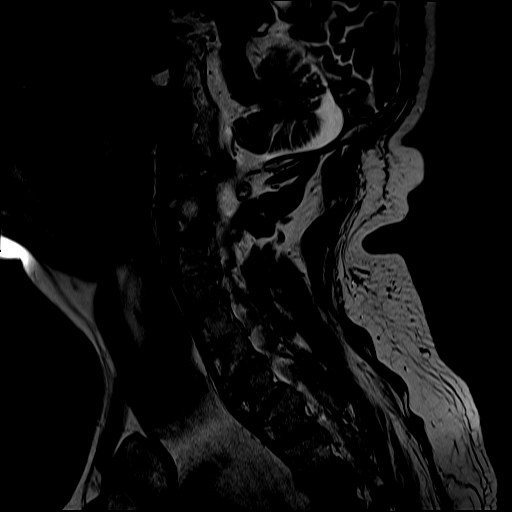
[im 13/13]
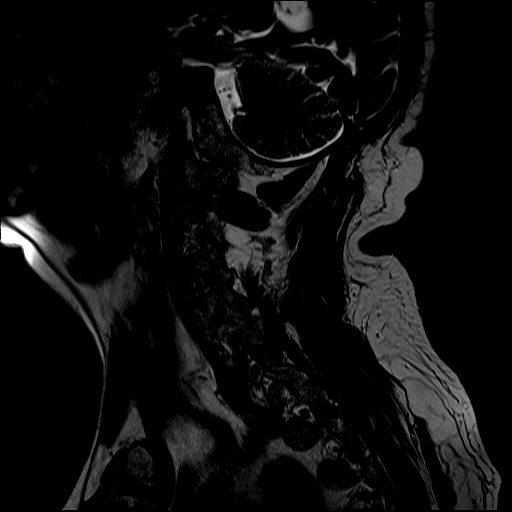

[Series 4: FLAIR · sagittal · 3.0mm · 0.49mm/px · 3 of 13 slices shown]
[im 1/13]
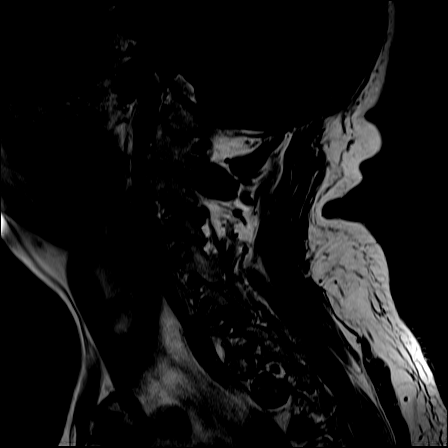
[im 7/13]
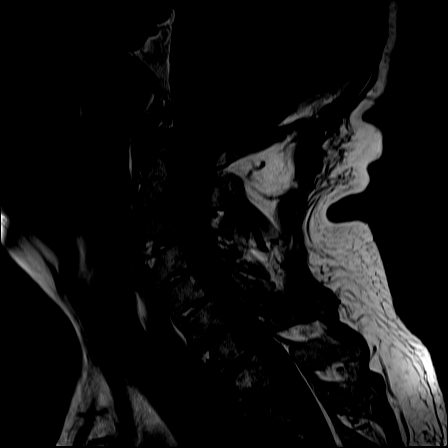
[im 13/13]
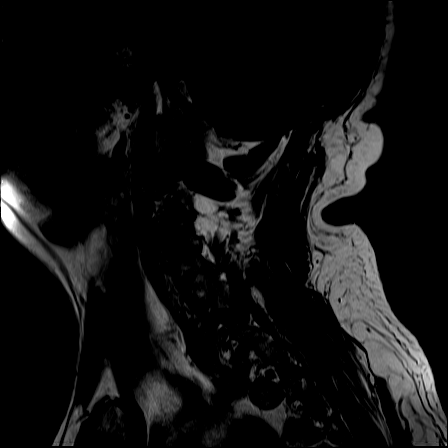

[Series 5: ir sagital · sagittal · 3.0mm · 0.27mm/px · 3 of 13 slices shown]
[im 3/13]
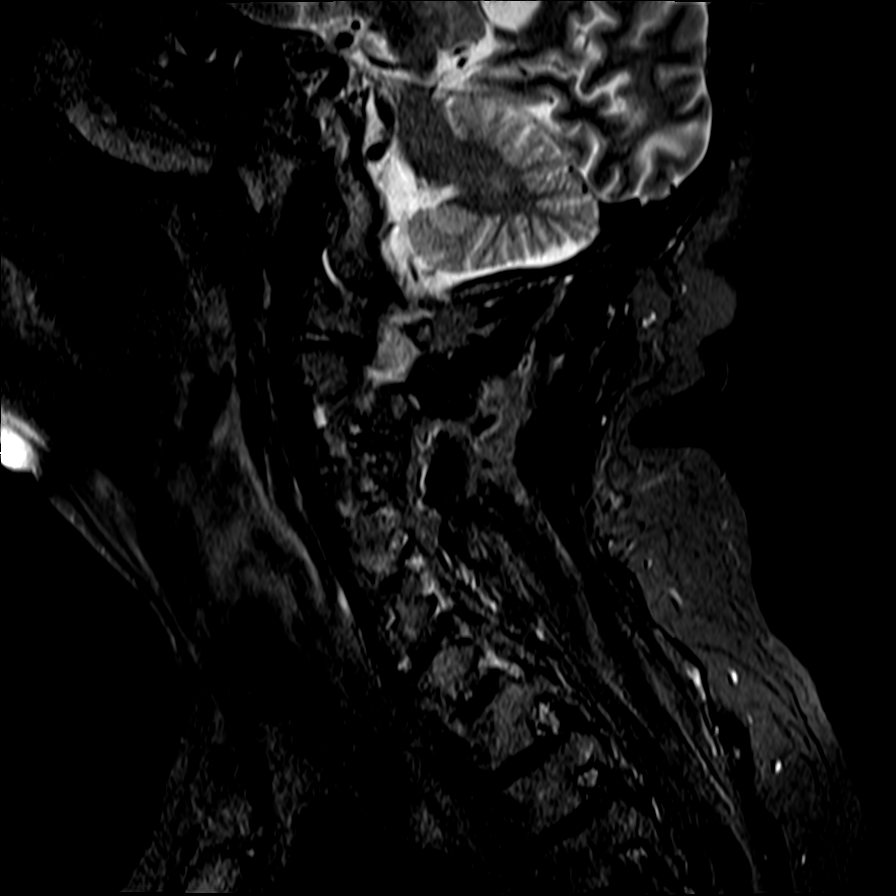
[im 8/13]
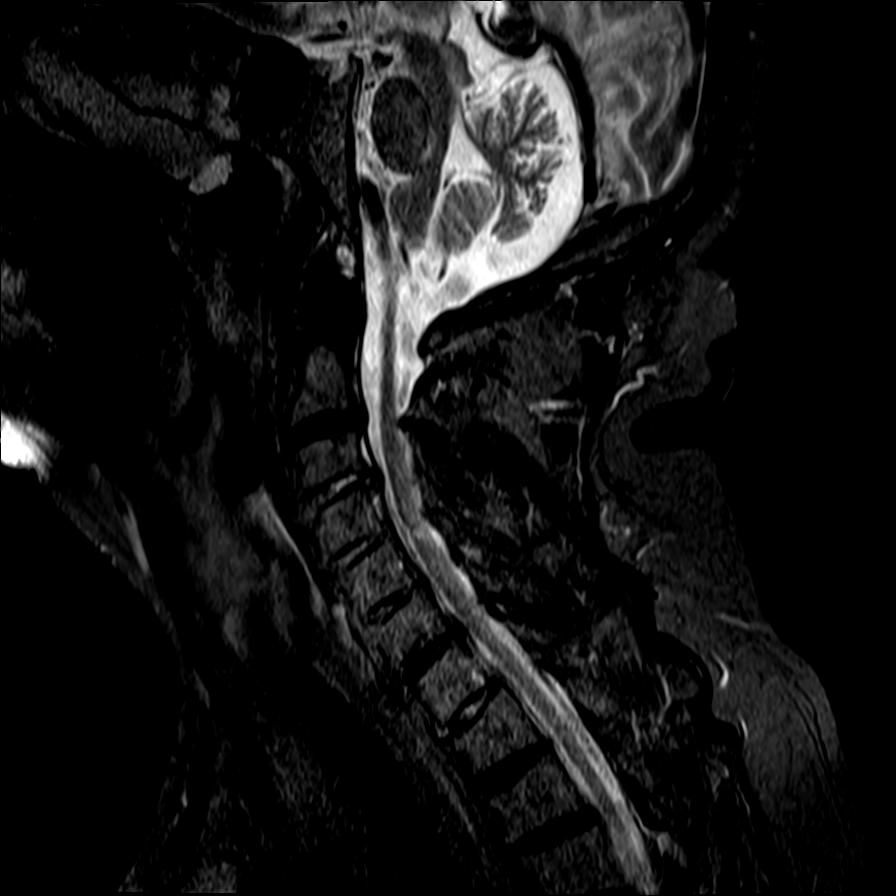
[im 13/13]
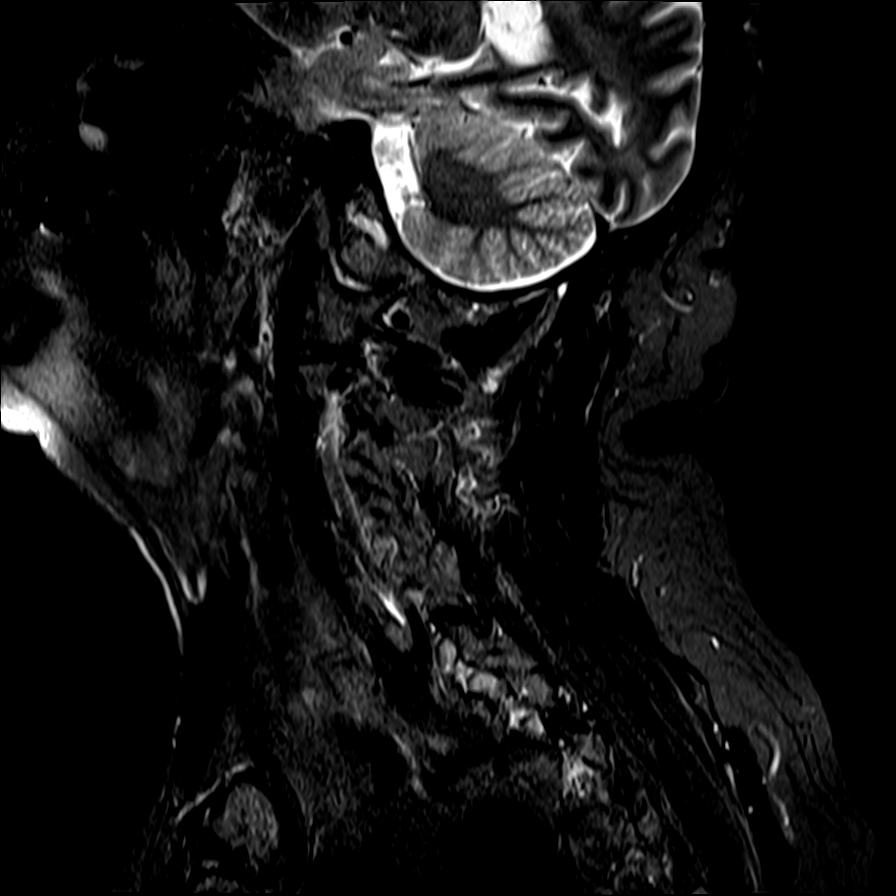

[Series 7: T2 · axial · 3.0mm · 0.19mm/px · z∈[-99,-17]mm · 3 of 37 slices shown (2 of 2)]
[im 5/37]
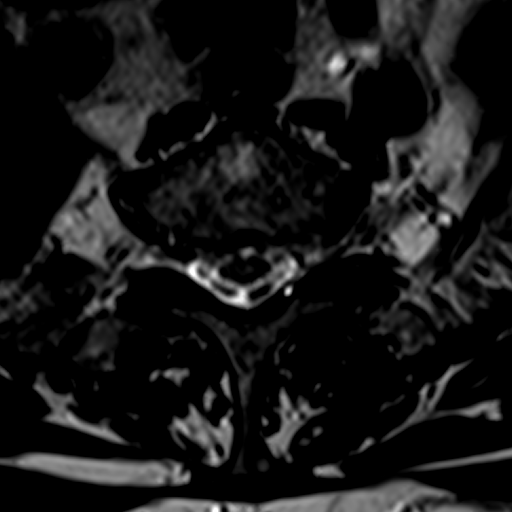
[im 20/37]
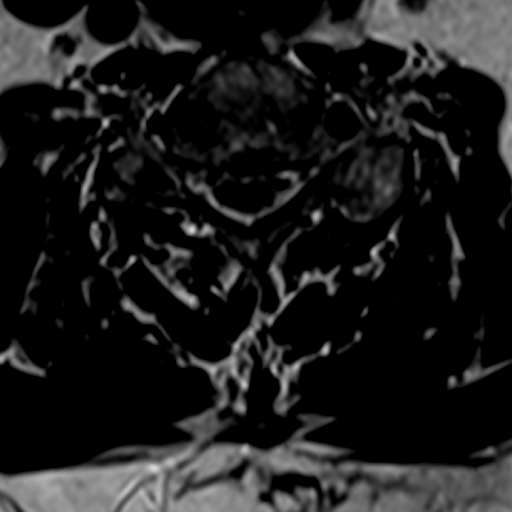
[im 32/37]
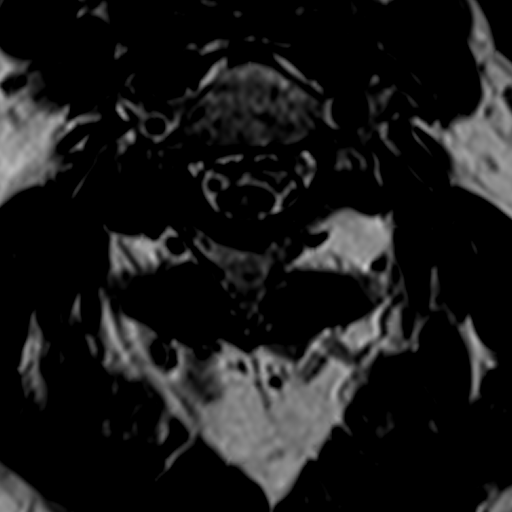

[14 of 48 positions shown; findings below may reference images not displayed]

FINDINGS: Alignment: There is minimal retrolisthesis of C3 on C4 and minimal
anterolisthesis of C4 on C5 and C5 on C6.

Vertebrae: Mild T1 superior endplate compression fracture, chronic
in appearance. A hemangioma is noted in the T1 vertebral body. Mild
edema in the right C7 superior facet which may be related to facet
arthritis. Partial ankylosis across the disc spaces from C3-C6. Left
facet ankylosis from C3-C6 and at C7-T1 with right-sided facet
ankylosis at C4-5.

Cord: Subcentimeter focus of T2 hyperintensity in the spinal cord at
the superior C5 level.

Posterior Fossa, vertebral arteries, paraspinal tissues:
Unremarkable.

Disc levels:

C2-3: Disc bulging, right greater than left uncovertebral
hypertrophy, and moderate right and mild left facet arthrosis result
in moderate right and mild left neural foraminal stenosis without
significant spinal stenosis.

C3-4: Disc bulging, uncovertebral spurring, and facet arthrosis
result in moderate right and severe left neural foraminal stenosis
and borderline to mild spinal stenosis.

C4-5: Uncovertebral spurring and facet arthrosis result in mild
bilateral neural foraminal stenosis without spinal stenosis.

C5-6: Listhesis with disc uncovering, small central pseudo disc
protrusion, uncovertebral spurring, and facet arthrosis result in
mild spinal stenosis and mild right and moderate left neural
foraminal stenosis.

C6-7: Disc bulging, uncovertebral spurring, and facet arthrosis
result in mild spinal stenosis and moderate to severe bilateral
neural foraminal stenosis.

C7-T1: Uncovertebral spurring and facet arthrosis result in
mild-to-moderate bilateral neural foraminal stenosis without spinal
stenosis.
IMPRESSION: 1. Moderate diffuse cervical disc and facet degeneration with mild
multilevel spinal stenosis.
2. Moderate right and severe left neural foraminal stenosis at C3-4.
3. Moderate to severe bilateral neural foraminal stenosis at C6-7.
4. Moderate left foraminal stenosis at C5-6.
5. Subcentimeter focus of spinal cord signal abnormality at C5
suggestive of a remote insult.

## 2016-11-29 DIAGNOSIS — H269 Unspecified cataract: Secondary | ICD-10-CM

## 2016-11-29 HISTORY — DX: Unspecified cataract: H26.9

## 2016-12-02 ENCOUNTER — Ambulatory Visit (INDEPENDENT_AMBULATORY_CARE_PROVIDER_SITE_OTHER): Payer: PPO | Admitting: Urology

## 2016-12-02 DIAGNOSIS — N5201 Erectile dysfunction due to arterial insufficiency: Secondary | ICD-10-CM | POA: Diagnosis not present

## 2016-12-02 DIAGNOSIS — R972 Elevated prostate specific antigen [PSA]: Secondary | ICD-10-CM

## 2016-12-02 DIAGNOSIS — N401 Enlarged prostate with lower urinary tract symptoms: Secondary | ICD-10-CM | POA: Diagnosis not present

## 2016-12-02 DIAGNOSIS — R350 Frequency of micturition: Secondary | ICD-10-CM | POA: Diagnosis not present

## 2017-01-05 DIAGNOSIS — Z299 Encounter for prophylactic measures, unspecified: Secondary | ICD-10-CM | POA: Diagnosis not present

## 2017-01-05 DIAGNOSIS — J449 Chronic obstructive pulmonary disease, unspecified: Secondary | ICD-10-CM | POA: Diagnosis not present

## 2017-01-05 DIAGNOSIS — E7800 Pure hypercholesterolemia, unspecified: Secondary | ICD-10-CM | POA: Diagnosis not present

## 2017-01-05 DIAGNOSIS — I10 Essential (primary) hypertension: Secondary | ICD-10-CM | POA: Diagnosis not present

## 2017-01-05 DIAGNOSIS — Z6830 Body mass index (BMI) 30.0-30.9, adult: Secondary | ICD-10-CM | POA: Diagnosis not present

## 2017-01-05 DIAGNOSIS — F329 Major depressive disorder, single episode, unspecified: Secondary | ICD-10-CM | POA: Diagnosis not present

## 2017-01-05 DIAGNOSIS — N400 Enlarged prostate without lower urinary tract symptoms: Secondary | ICD-10-CM | POA: Diagnosis not present

## 2017-01-05 DIAGNOSIS — E669 Obesity, unspecified: Secondary | ICD-10-CM | POA: Diagnosis not present

## 2017-01-05 DIAGNOSIS — E1165 Type 2 diabetes mellitus with hyperglycemia: Secondary | ICD-10-CM | POA: Diagnosis not present

## 2017-01-05 DIAGNOSIS — G5600 Carpal tunnel syndrome, unspecified upper limb: Secondary | ICD-10-CM | POA: Diagnosis not present

## 2017-01-20 ENCOUNTER — Ambulatory Visit (HOSPITAL_COMMUNITY)
Admission: EM | Admit: 2017-01-20 | Discharge: 2017-01-20 | Disposition: A | Discharge status: Home / Self Care | Payer: PPO | Attending: Family Medicine | Admitting: Family Medicine

## 2017-01-20 ENCOUNTER — Encounter (HOSPITAL_COMMUNITY): Payer: Self-pay | Admitting: Family Medicine

## 2017-01-20 DIAGNOSIS — M545 Low back pain, unspecified: Secondary | ICD-10-CM

## 2017-01-20 DIAGNOSIS — T148XXA Other injury of unspecified body region, initial encounter: Secondary | ICD-10-CM | POA: Diagnosis not present

## 2017-01-20 MED ORDER — DICLOFENAC SODIUM 1 % TD GEL: 1.0000 "application " | Freq: Four times a day (QID) | TRANSDERMAL | 0 refills | Status: DC

## 2017-01-20 MED ORDER — CYCLOBENZAPRINE HCL 5 MG PO TABS: 5.0000 mg | ORAL_TABLET | Freq: Three times a day (TID) | ORAL | 0 refills | Status: DC | PRN

## 2017-01-20 MED ORDER — MELOXICAM 15 MG PO TABS: 15.0000 mg | ORAL_TABLET | Freq: Every day | ORAL | 0 refills | Status: DC

## 2017-01-20 MED ORDER — TRAMADOL HCL 50 MG PO TABS: 50.0000 mg | ORAL_TABLET | Freq: Four times a day (QID) | ORAL | 0 refills | Status: DC | PRN

## 2017-01-20 NOTE — Discharge Instructions (Signed)
You may continue to use heat at this time and not eyes. You may consider using Therma Care pads that get warm when exposed to air. You can apply this to the area of pain to keep the muscles warm. Apply the diclofenac jail to the area of pain 4 times a day and reapply the heat patch over this area. Perform the stretches slowly and gently as demonstrated 2-3 times a day. No heavy lifting, pulling, pushing or other movements that tend to make the pain worse. Heel with some of the medicine that has been prescribed to you for back pain can cause drowsiness. Recommend moving slowly, no driving while taking the medicine.

## 2017-01-20 NOTE — ED Triage Notes (Signed)
Pt here for left lower back pain x 5 days. sts hurts to turn, move. sts he was doing some exercises a week ago. sts taking OTC meds without relief.

## 2017-01-20 NOTE — ED Provider Notes (Signed)
CSN: [id number]     Arrival date & time 01/20/17  1257 History   First MD Initiated Contact with Patient 01/20/17 1357     Chief Complaint  Patient presents with  . Back Pain   (Consider location/radiation/quality/duration/timing/severity/associated sxs/prior Treatment) 71 year old male states that about 3 or 4 days ago he awoke with pain in the right low to mid back just above and right of the lumbar spine. The pain does not migrate. It is worse with most kinds of movement getting up from a sitting position, ambulating, bending over, twisting, turning and pulling. Denies focal paresthesias or weakness. He does have a history of disc surgery the vertical scars there. The pain is just above and to the right of the midline spinal scar. No midline tenderness.      Past Medical History:  Diagnosis Date  . Hypertension    Past Surgical History:  Procedure Laterality Date  . COLON RESECTION  2015  . LUMBAR FUSION  1990  . OTHER SURGICAL HISTORY Left    Arm s/p accident  . REPLACEMENT TOTAL KNEE BILATERAL Bilateral 1990   Family History  Problem Relation Age of Onset  . COPD Father   . Throat cancer Father   . Cancer Brother    Social History  Substance Use Topics  . Smoking status: Former Smoker    Quit date: 04/28/1992  . Smokeless tobacco: Never Used  . Alcohol use Yes     Comment: Occasional    Review of Systems  Constitutional: Negative.   Respiratory: Negative.   Gastrointestinal: Negative.   Genitourinary: Negative.   Musculoskeletal: Positive for back pain and myalgias.       As per HPI  Skin: Negative.   Neurological: Negative for dizziness, weakness, numbness and headaches.  All other systems reviewed and are negative.   Allergies  Patient has no known allergies.  Home Medications   Prior to Admission medications   Medication Sig Start Date End Date Taking? Authorizing Provider  albuterol (PROVENTIL HFA;VENTOLIN HFA) 108 (90 Base) MCG/ACT inhaler  Inhale into the lungs every 6 (six) hours as needed for wheezing or shortness of breath.    [provider]  aspirin EC 81 MG tablet Take 81 mg by mouth daily.    [provider]  cyclobenzaprine (FLEXERIL) 5 MG tablet Take 1 tablet (5 mg total) by mouth 3 (three) times daily as needed for muscle spasms. 01/20/17   Janne Napoleon, NP  diclofenac sodium (VOLTAREN) 1 % GEL Apply 1 application topically 4 (four) times daily. 01/20/17   Janne Napoleon, NP  gabapentin (NEURONTIN) 100 MG capsule Take 1 capsule (100 mg total) by mouth 3 (three) times daily. 05/18/16   Kathrynn Ducking, MD  lisinopril (PRINIVIL,ZESTRIL) 10 MG tablet Take 10 mg by mouth daily. 04/05/16   [provider]  meloxicam (MOBIC) 15 MG tablet Take 1 tablet (15 mg total) by mouth daily. 01/20/17   Janne Napoleon, NP  metoprolol succinate (TOPROL-XL) 50 MG 24 hr tablet TAKE TWO (2) TABLETS BY MOUTH DAILY. 04/05/16   [provider]  traMADol (ULTRAM) 50 MG tablet Take 1 tablet (50 mg total) by mouth every 6 (six) hours as needed. 01/20/17   Janne Napoleon, NP   Meds Ordered and Administered this Visit  Medications - No data to display  BP 136/72 (BP Location: Left Arm)   Pulse 67   Temp 98.4 F (36.9 C) (Oral)   Resp 18   SpO2 96%  No  data found.   Physical Exam  Constitutional: He is oriented to person, place, and time. He appears well-developed and well-nourished.  HENT:  Head: Normocephalic and atraumatic.  Eyes: EOM are normal. Left eye exhibits no discharge.  Neck: Normal range of motion.  Pulmonary/Chest: Effort normal.  Musculoskeletal:  Tenderness to the right para lower thoracic upper lumbar musculature. No spinal tenderness, deformity, swelling or discoloration. Pain is elicited by having the patient rotates the back, leaning forward, it off the table and process of sitting. Lower extremity strength is 5 over 5.  Neurological: He is alert and oriented to person, place, and time. No cranial  nerve deficit.  Skin: Skin is warm and dry.  Psychiatric: He has a normal mood and affect.  Nursing note and vitals reviewed.   Urgent Care Course     Procedures (including critical care time)  Labs Review Labs Reviewed - No data to display  Imaging Review No results found.   Visual Acuity Review  Right Eye Distance:   Left Eye Distance:   Bilateral Distance:    Right Eye Near:   Left Eye Near:    Bilateral Near:         MDM   1. Acute right-sided low back pain without sciatica   2. Muscle strain    You may continue to use heat at this time and not eyes. You may consider using Therma Care pads that get warm when exposed to air. You can apply this to the area of pain to keep the muscles warm. Apply the diclofenac jail to the area of pain 4 times a day and reapply the heat patch over this area. Perform the stretches slowly and gently as demonstrated 2-3 times a day. No heavy lifting, pulling, pushing or other movements that tend to make the pain worse. Heel with some of the medicine that has been prescribed to you for back pain can cause drowsiness. Recommend moving slowly, no driving while taking the medicine. Meds ordered this encounter  Medications  . diclofenac sodium (VOLTAREN) 1 % GEL    Sig: Apply 1 application topically 4 (four) times daily.    Dispense:  100 g    Refill:  0    Order Specific Question:   Supervising Provider    Answer:   Robyn Haber [[id number]]  . meloxicam (MOBIC) 15 MG tablet    Sig: Take 1 tablet (15 mg total) by mouth daily.    Dispense:  8 tablet    Refill:  0    Order Specific Question:   Supervising Provider    Answer:   Robyn Haber [[id number]]  . traMADol (ULTRAM) 50 MG tablet    Sig: Take 1 tablet (50 mg total) by mouth every 6 (six) hours as needed.    Dispense:  15 tablet    Refill:  0    Order Specific Question:   Supervising Provider    Answer:   Robyn Haber [[id number]]  . cyclobenzaprine (FLEXERIL) 5 MG tablet    Sig:  Take 1 tablet (5 mg total) by mouth 3 (three) times daily as needed for muscle spasms.    Dispense:  12 tablet    Refill:  0    Order Specific Question:   Supervising Provider    Answer:   Robyn Haber [[id number]]       Janne Napoleon, NP 01/20/17 1421

## 2017-02-03 DIAGNOSIS — N401 Enlarged prostate with lower urinary tract symptoms: Secondary | ICD-10-CM | POA: Diagnosis not present

## 2017-02-10 ENCOUNTER — Ambulatory Visit (INDEPENDENT_AMBULATORY_CARE_PROVIDER_SITE_OTHER): Payer: PPO | Admitting: Urology

## 2017-02-10 DIAGNOSIS — N401 Enlarged prostate with lower urinary tract symptoms: Secondary | ICD-10-CM | POA: Diagnosis not present

## 2017-02-10 DIAGNOSIS — R972 Elevated prostate specific antigen [PSA]: Secondary | ICD-10-CM | POA: Diagnosis not present

## 2017-02-10 DIAGNOSIS — N5201 Erectile dysfunction due to arterial insufficiency: Secondary | ICD-10-CM

## 2017-02-10 DIAGNOSIS — E291 Testicular hypofunction: Secondary | ICD-10-CM

## 2017-02-16 DIAGNOSIS — Z6830 Body mass index (BMI) 30.0-30.9, adult: Secondary | ICD-10-CM | POA: Diagnosis not present

## 2017-02-16 DIAGNOSIS — J439 Emphysema, unspecified: Secondary | ICD-10-CM | POA: Diagnosis not present

## 2017-02-16 DIAGNOSIS — I10 Essential (primary) hypertension: Secondary | ICD-10-CM | POA: Diagnosis not present

## 2017-02-16 DIAGNOSIS — R972 Elevated prostate specific antigen [PSA]: Secondary | ICD-10-CM | POA: Diagnosis not present

## 2017-02-16 DIAGNOSIS — E1165 Type 2 diabetes mellitus with hyperglycemia: Secondary | ICD-10-CM | POA: Diagnosis not present

## 2017-02-16 DIAGNOSIS — E669 Obesity, unspecified: Secondary | ICD-10-CM | POA: Diagnosis not present

## 2017-02-16 DIAGNOSIS — E7800 Pure hypercholesterolemia, unspecified: Secondary | ICD-10-CM | POA: Diagnosis not present

## 2017-02-16 DIAGNOSIS — Z299 Encounter for prophylactic measures, unspecified: Secondary | ICD-10-CM | POA: Diagnosis not present

## 2017-02-16 DIAGNOSIS — F329 Major depressive disorder, single episode, unspecified: Secondary | ICD-10-CM | POA: Diagnosis not present

## 2017-03-13 DIAGNOSIS — E291 Testicular hypofunction: Secondary | ICD-10-CM | POA: Diagnosis not present

## 2017-05-15 DIAGNOSIS — E291 Testicular hypofunction: Secondary | ICD-10-CM | POA: Diagnosis not present

## 2017-05-19 ENCOUNTER — Ambulatory Visit (INDEPENDENT_AMBULATORY_CARE_PROVIDER_SITE_OTHER): Payer: PPO | Admitting: Urology

## 2017-05-19 DIAGNOSIS — N5201 Erectile dysfunction due to arterial insufficiency: Secondary | ICD-10-CM | POA: Diagnosis not present

## 2017-05-19 DIAGNOSIS — R351 Nocturia: Secondary | ICD-10-CM | POA: Diagnosis not present

## 2017-05-19 DIAGNOSIS — E230 Hypopituitarism: Secondary | ICD-10-CM

## 2017-05-19 DIAGNOSIS — R972 Elevated prostate specific antigen [PSA]: Secondary | ICD-10-CM | POA: Diagnosis not present

## 2017-05-19 DIAGNOSIS — N401 Enlarged prostate with lower urinary tract symptoms: Secondary | ICD-10-CM | POA: Diagnosis not present

## 2017-05-24 DIAGNOSIS — E1165 Type 2 diabetes mellitus with hyperglycemia: Secondary | ICD-10-CM | POA: Diagnosis not present

## 2017-05-24 DIAGNOSIS — Z6830 Body mass index (BMI) 30.0-30.9, adult: Secondary | ICD-10-CM | POA: Diagnosis not present

## 2017-05-24 DIAGNOSIS — Z23 Encounter for immunization: Secondary | ICD-10-CM | POA: Diagnosis not present

## 2017-05-24 DIAGNOSIS — Z87891 Personal history of nicotine dependence: Secondary | ICD-10-CM | POA: Diagnosis not present

## 2017-05-24 DIAGNOSIS — Z299 Encounter for prophylactic measures, unspecified: Secondary | ICD-10-CM | POA: Diagnosis not present

## 2017-05-24 DIAGNOSIS — J449 Chronic obstructive pulmonary disease, unspecified: Secondary | ICD-10-CM | POA: Diagnosis not present

## 2017-06-09 ENCOUNTER — Ambulatory Visit: Payer: PPO | Admitting: Urology

## 2017-06-09 DIAGNOSIS — N401 Enlarged prostate with lower urinary tract symptoms: Secondary | ICD-10-CM | POA: Diagnosis not present

## 2017-06-09 DIAGNOSIS — R351 Nocturia: Secondary | ICD-10-CM | POA: Diagnosis not present

## 2017-06-09 DIAGNOSIS — R350 Frequency of micturition: Secondary | ICD-10-CM

## 2017-06-13 ENCOUNTER — Other Ambulatory Visit: Payer: Self-pay | Admitting: Urology

## 2017-06-29 NOTE — Patient Instructions (Signed)
Kevin Hooper  06/29/2017   Your procedure is scheduled on: 07-04-17   Report to [facility] Main  Entrance Take [place] Elevators to 3rd floor to  [place] at 6:45 AM.   Call this number if you have problems the morning of surgery [phone]    Remember: ONLY 1 PERSON MAY GO WITH YOU TO SHORT STAY TO GET  READY MORNING OF [place].  Do not eat food or drink liquids :After Midnight.     Take these medicines the morning of surgery with A SIP OF WATER: Metoprolol Succinate (Toprol-XL). You may also bring and use your inhaler as needed.                                You may not have any metal on your body including hair pins and              piercings  Do not wear jewelry, lotions, powders or deodorant             Men may shave face and neck.   Do not bring valuables to the hospital. [place].  Contacts, dentures or bridgework may not be worn into surgery.  Leave suitcase in the car. After surgery it may be brought to your room.                Please read over the following fact sheets you were given: _____________________________________________________________________             [facility] - Preparing for Surgery Before surgery, you can play an important role.  Because skin is not sterile, your skin needs to be as free of germs as possible.  You can reduce the number of germs on your skin by washing with CHG (chlorahexidine gluconate) soap before surgery.  CHG is an antiseptic cleaner which kills germs and bonds with the skin to continue killing germs even after washing. Please DO NOT use if you have an allergy to CHG or antibacterial soaps.  If your skin becomes reddened/irritated stop using the CHG and inform your nurse when you arrive at Short Stay. Do not shave (including legs and underarms) for at least 48 hours prior to the first CHG shower.  You may shave your face/neck. Please follow  these instructions carefully:  1.  Shower with CHG Soap the night before surgery and the  morning of Surgery.  2.  If you choose to wash your hair, wash your hair first as usual with your  normal  shampoo.  3.  After you shampoo, rinse your hair and body thoroughly to remove the  shampoo.                           4.  Use CHG as you would any other liquid soap.  You can apply chg directly  to the skin and wash                       Gently with a scrungie or clean washcloth.  5.  Apply the CHG Soap to your body ONLY FROM THE NECK DOWN.   Do not use on face/ open  Wound or open sores. Avoid contact with eyes, ears mouth and genitals (private parts).                       Wash face,  Genitals (private parts) with your normal soap.             6.  Wash thoroughly, paying special attention to the area where your surgery  will be performed.  7.  Thoroughly rinse your body with warm water from the neck down.  8.  DO NOT shower/wash with your normal soap after using and rinsing off  the CHG Soap.                9.  Pat yourself dry with a clean towel.            10.  Wear clean pajamas.            11.  Place clean sheets on your bed the night of your first shower and do not  sleep with pets. Day of Surgery : Do not apply any lotions/deodorants the morning of surgery.  Please wear clean clothes to the hospital/surgery center.  FAILURE TO FOLLOW THESE INSTRUCTIONS MAY RESULT IN THE CANCELLATION OF YOUR SURGERY PATIENT SIGNATURE_________________________________  NURSE SIGNATURE__________________________________  ________________________________________________________________________

## 2017-06-30 ENCOUNTER — Other Ambulatory Visit: Payer: Self-pay

## 2017-06-30 ENCOUNTER — Encounter (HOSPITAL_COMMUNITY): Payer: Self-pay

## 2017-06-30 ENCOUNTER — Encounter (HOSPITAL_COMMUNITY)
Admission: RE | Admit: 2017-06-30 | Discharge: 2017-06-30 | Disposition: A | Discharge status: Home / Self Care | Payer: PPO | Source: Ambulatory Visit | Attending: Urology | Admitting: Urology

## 2017-06-30 DIAGNOSIS — Z01818 Encounter for other preprocedural examination: Secondary | ICD-10-CM | POA: Diagnosis not present

## 2017-06-30 DIAGNOSIS — N138 Other obstructive and reflux uropathy: Secondary | ICD-10-CM | POA: Insufficient documentation

## 2017-06-30 DIAGNOSIS — I10 Essential (primary) hypertension: Secondary | ICD-10-CM | POA: Diagnosis not present

## 2017-06-30 DIAGNOSIS — N401 Enlarged prostate with lower urinary tract symptoms: Secondary | ICD-10-CM | POA: Insufficient documentation

## 2017-06-30 DIAGNOSIS — Z01812 Encounter for preprocedural laboratory examination: Secondary | ICD-10-CM | POA: Insufficient documentation

## 2017-06-30 HISTORY — DX: Hyperlipidemia, unspecified: E78.5

## 2017-06-30 HISTORY — DX: Unspecified osteoarthritis, unspecified site: M19.90

## 2017-06-30 HISTORY — DX: Other specified disorders of prostate: N42.89

## 2017-06-30 HISTORY — DX: Pain in unspecified joint: M25.50

## 2017-06-30 HISTORY — DX: Unspecified cataract: H26.9

## 2017-06-30 LAB — BASIC METABOLIC PANEL
Anion gap: 8 (ref 5–15)
BUN: 41 mg/dL — ABNORMAL HIGH (ref 6–20)
CO2: 21 mmol/L — ABNORMAL LOW (ref 22–32)
Calcium: 9.3 mg/dL (ref 8.9–10.3)
Chloride: 111 mmol/L (ref 101–111)
Creatinine, Ser: 1.28 mg/dL — ABNORMAL HIGH (ref 0.61–1.24)
GFR calc Af Amer: >60 mL/min (ref >60)
GFR calc non Af Amer: 55 mL/min — ABNORMAL LOW (ref >60)
Glucose, Bld: 121 mg/dL — ABNORMAL HIGH (ref 65–99)
Potassium: 4.5 mmol/L (ref 3.5–5.1)
Sodium: 140 mmol/L (ref 135–145)

## 2017-06-30 LAB — CBC
HCT: 42.5 % (ref 39.0–52.0)
Hemoglobin: 14.7 g/dL (ref 13.0–17.0)
MCH: 30.1 pg (ref 26.0–34.0)
MCHC: 34.6 g/dL (ref 30.0–36.0)
MCV: 87.1 fL (ref 78.0–100.0)
Platelets: 205 10*3/uL (ref 150–400)
RBC: 4.88 MIL/uL (ref 4.22–5.81)
RDW: 13.3 % (ref 11.5–15.5)
WBC: 8.4 10*3/uL (ref 4.0–10.5)

## 2017-06-30 NOTE — Progress Notes (Signed)
06-30-17 BMP result routed to Dr. Jeffie Pollock for review

## 2017-07-03 NOTE — H&P (Signed)
CC: I have symptoms of an enlarged prostate.  HPI: Kevin Hooper is a 71 year-old male established patient who is here for symptoms of enlarged prostate.    Kevin Hooper returns today in f/u for voiding studies. He decided not to have the sleep study. He has nocturia frequency with BPH and BOO. He is on alfuzosin. His IPSS is 22.   05/19/17: Kevin Hooper is a former patient of Dr. Exie Parody with a history of an elevated PSA to 16.5 with a negative biopsy in 3/16 with a prostate volume of 127ml. He has a history of frequency and nocturia and remains on Alfuzosin and an OTC med but he doesn't think it is working as well. He has more symptoms at night. He has more hesitancy and intermittency at night with nocturia 2-3x. 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 He has not had a sleep study. PSA is 13.1 which is stable. He did have some urgency with UUI but that has improved.      CC: AUA Questions Scoring.  HPI:     AUA Symptom Score: More than 50% of the time he has the sensation of not emptying his bladder completely when finished urinating. 50% of the time he has to urinate again fewer than two hours after he has finished urinating. More than 50% of the time he has to start and stop again several times when he urinates. Less than 50% of the time he finds it difficult to postpone urination. 50% of the time he has a weak urinary stream. 50% of the time he has to push or strain to begin urination. He has to get up to urinate 3 times from the time he goes to bed until the time he gets up in the morning.   Calculated AUA Symptom Score: 22    ALLERGIES: None   MEDICATIONS: Lisinopril 5 mg tablet  Metoprolol Tartrate  Alfuzosin Hcl Er 10 mg tablet, extended release 24 hr 1 tablet PO Daily  Clomiphene Citrate 50 mg tablet 1/2 tablet PO Every Other Day 1/2 tab po 3x weekly  Sildenafil 20 mg tablet 1 tablet PO PRN Pt. to take 1-5 tabs prn prior to sexual activity     GU PSH: Rpr Umbil Hern;  Block > 5 Yr      PSH Notes: arm surgery   NON-GU PSH: Back surgery Knee replacement, Bilateral Partial colectomy - 2016    GU PMH: BPH w/LUTS (Worsening), He has bothersome LUTs that is primarily at night. He is a poor sleeper and may have apnea. I am going to refer him to the sleep center but will have him return after that for a flowrate, PVR and possibly cystoscopy. I don't want to change therapy until he has a sleep evaluation. - 05/19/2017, He has stable voiding symptoms on alfuzosin. I refilled the med. , - 02/10/2017, He had frequency as his primary complaint but was also a newly diagnosed diabetic with the alfuzosin was started. He is going to stop that and see if the better diabetes control is enough to manage the frequency. He does have a very large prostate on exam. , - 12/02/2016 ED due to arterial insufficiency (Stable), He has stable symptoms with some response to sildenafil. - 05/19/2017, Sildenafil helps. , - 02/10/2017, He has tried Viagra remotely with some improvement, so I am going to give him sildenafil. I will also check a testosterone level. , - 12/02/2016 Nocturia (Worsening) - 05/19/2017 Primary hypogonadism, He has  hypogonadism. I discussed the options for therapy and will get him started on clomid 25mg  po 3x weekly. He will have a testosterone and FSH in a month and a Testosterone, H&H and PSA in 3 months with an OV. - 02/10/2017 Elevated PSA, He has had an elevated with negative biopsy in 2016 and his PSA's have been declining. I am going to get a PSA today. - 12/02/2016 Urinary Frequency - 12/02/2016    NON-GU PMH: Hypogonadotropic hypogonadism (Improving), His T level is up with Clomid. - 05/19/2017 Arthritis Diabetes Type 2 Hypertension    FAMILY HISTORY: Diabetes - Uncle, Father   SOCIAL HISTORY: Marital Status: Married Preferred Language: English Current Smoking Status: Patient does not smoke anymore. Has not smoked since 11/30/1991. Smoked for 30 years.   Tobacco  Use Assessment Completed: Used Tobacco in last 30 days? Social Drinker.  Does not drink caffeine. 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    REVIEW OF SYSTEMS:    GU Review Male:   Patient reports frequent urination, get up at night to urinate, leakage of urine, trouble starting your stream, and erection problems. Patient denies hard to postpone urination, burning/ pain with urination, stream starts and stops, have to strain to urinate , and penile pain.  Gastrointestinal (Upper):   Patient denies nausea, vomiting, and indigestion/ heartburn.  Gastrointestinal (Lower):   Patient denies diarrhea and constipation.  Constitutional:   Patient denies fever, night sweats, weight loss, and fatigue.  Skin:   Patient denies skin rash/ lesion and itching.  Eyes:   Patient denies blurred vision and double vision.  Ears/ Nose/ Throat:   Patient denies sore throat and sinus problems.  Hematologic/Lymphatic:   Patient denies swollen glands and easy bruising.  Cardiovascular:   Patient denies leg swelling and chest pains.  Respiratory:   Patient denies cough and shortness of breath.  Endocrine:   Patient denies excessive thirst.  Musculoskeletal:   Patient reports joint pain. Patient denies back pain.  Neurological:   Patient denies headaches and dizziness.  Psychologic:   Patient denies depression and anxiety.   VITAL SIGNS:      06/09/2017 11:10 AM  Weight 195 lb / 88.45 kg  Height 68.5 in / 173.99 cm  BP 166/84 mmHg  Pulse 67 /min  Temperature 97.9 F / 36.6 C  BMI 29.2 kg/m   PAST DATA REVIEWED:  Source Of History:  Patient   05/15/17 12/02/16  PSA  Total PSA 13.1 ng/dl 13.0 ng/dl    05/15/17 03/13/17 02/03/17 12/02/16  Hormones  Testosterone, Total 400 pg/dL 509 pg/dL 215 pg/dL 162 pg/dL    PROCEDURES:         Flexible Cystoscopy - 52000  Risks, benefits, and some of the potential complications of the procedure were discussed. 73ml of 2% lidocaine jelly was instilled  intraurethrally.  Cipro 500mg  given for antibiotic prophylaxis.     Meatus:  Normal size. Normal location. Normal condition.  Urethra:  No strictures.  External Sphincter:  Normal.  Verumontanum:  Normal.  Prostate:  Obstructing. Enlarged median lobe. Moderate hyperplasia. 5cm  Bladder Neck:  Non-obstructing.  Ureteral Orifices:  Normal location. Normal size. Normal shape. Effluxed clear urine.  Bladder:  Moderate trabeculation. No tumors. Normal mucosa. No stones.      The procedure was well tolerated and there were no complications.           Flow Rate - 51741  Flow Time: 0:20 min:sec  Voided Volume: 57 cc  Peak Flow Rate: 3 cc/sec  Time of Peak Flow: 0:01 min:sec  Average Flow Rate: 2 cc/sec  Total Void Time: 0:26 min:sec           PVR Ultrasound - 03009  Scanned Volume: 89.4 cc         Urinalysis - 81003 Dipstick Dipstick Cont'd  Specimen: Voided Bilirubin: Neg  Appearance: Clear Ketones: Neg  Specific Gravity: 1.020 Blood: Trace Intact  pH: 5.0 Protein: 1+  Glucose: Neg Urobilinogen: 0.2    Nitrites: Neg    Leukocyte Esterase: Neg    ASSESSMENT:      ICD-10 Details  1 GU:   BPH w/LUTS - N40.1 He has BPH with BOO with a large prostate but it appears ammenable to a TURP. I discussed finasteride but I don't believe he is a good candidate for minimally invasive options. I reviewd the risks of a TURP including bleeding, infection, incontinence, stricture, need for secondary procedures, ejaculatory and erectile dysfunction, thrombotic events, fluid overload and anesthetic complications. I explained that 95% of men will have relief of the obstructive symptoms and about 70% will have relief of the irritative symptoms. I will get him set up in the near future.   2   Urinary Frequency - R35.0   3   Nocturia - R35.1    PLAN:            Medications Stop Meds: Metformin Hcl  Discontinue: 06/09/2017  - Reason: The medication cycle was completed.             Schedule Return Visit/Planned Activity: Next Available Appointment - Schedule Surgery

## 2017-07-04 ENCOUNTER — Ambulatory Visit (HOSPITAL_COMMUNITY): Payer: PPO | Admitting: Certified Registered Nurse Anesthetist

## 2017-07-04 ENCOUNTER — Encounter (HOSPITAL_COMMUNITY): Payer: Self-pay | Admitting: *Deleted

## 2017-07-04 ENCOUNTER — Other Ambulatory Visit: Payer: Self-pay

## 2017-07-04 ENCOUNTER — Inpatient Hospital Stay (HOSPITAL_COMMUNITY)
Admission: RE | Admit: 2017-07-04 | Discharge: 2017-07-06 | DRG: 713 | Disposition: A | Discharge status: Home / Self Care | Payer: PPO | Source: Ambulatory Visit | Attending: Urology | Admitting: Urology

## 2017-07-04 ENCOUNTER — Encounter (HOSPITAL_COMMUNITY)
Admission: RE | Disposition: A | Discharge status: Home / Self Care | Payer: Self-pay | Source: Ambulatory Visit | Attending: Urology

## 2017-07-04 DIAGNOSIS — N320 Bladder-neck obstruction: Secondary | ICD-10-CM | POA: Diagnosis present

## 2017-07-04 DIAGNOSIS — D62 Acute posthemorrhagic anemia: Secondary | ICD-10-CM | POA: Diagnosis not present

## 2017-07-04 DIAGNOSIS — R32 Unspecified urinary incontinence: Secondary | ICD-10-CM | POA: Diagnosis present

## 2017-07-04 DIAGNOSIS — E785 Hyperlipidemia, unspecified: Secondary | ICD-10-CM | POA: Diagnosis present

## 2017-07-04 DIAGNOSIS — E118 Type 2 diabetes mellitus with unspecified complications: Secondary | ICD-10-CM | POA: Diagnosis present

## 2017-07-04 DIAGNOSIS — N401 Enlarged prostate with lower urinary tract symptoms: Secondary | ICD-10-CM | POA: Diagnosis present

## 2017-07-04 DIAGNOSIS — R3912 Poor urinary stream: Secondary | ICD-10-CM | POA: Diagnosis present

## 2017-07-04 DIAGNOSIS — Z9049 Acquired absence of other specified parts of digestive tract: Secondary | ICD-10-CM | POA: Diagnosis not present

## 2017-07-04 DIAGNOSIS — Z7982 Long term (current) use of aspirin: Secondary | ICD-10-CM | POA: Diagnosis not present

## 2017-07-04 DIAGNOSIS — Z96653 Presence of artificial knee joint, bilateral: Secondary | ICD-10-CM | POA: Diagnosis present

## 2017-07-04 DIAGNOSIS — R3911 Hesitancy of micturition: Secondary | ICD-10-CM | POA: Diagnosis present

## 2017-07-04 DIAGNOSIS — N400 Enlarged prostate without lower urinary tract symptoms: Secondary | ICD-10-CM | POA: Diagnosis not present

## 2017-07-04 DIAGNOSIS — Z87891 Personal history of nicotine dependence: Secondary | ICD-10-CM

## 2017-07-04 DIAGNOSIS — R350 Frequency of micturition: Secondary | ICD-10-CM | POA: Diagnosis present

## 2017-07-04 DIAGNOSIS — R351 Nocturia: Secondary | ICD-10-CM | POA: Diagnosis present

## 2017-07-04 DIAGNOSIS — M1990 Unspecified osteoarthritis, unspecified site: Secondary | ICD-10-CM | POA: Diagnosis present

## 2017-07-04 DIAGNOSIS — Z79899 Other long term (current) drug therapy: Secondary | ICD-10-CM

## 2017-07-04 DIAGNOSIS — N4289 Other specified disorders of prostate: Secondary | ICD-10-CM | POA: Diagnosis present

## 2017-07-04 DIAGNOSIS — N138 Other obstructive and reflux uropathy: Secondary | ICD-10-CM | POA: Diagnosis present

## 2017-07-04 DIAGNOSIS — R3915 Urgency of urination: Secondary | ICD-10-CM | POA: Diagnosis present

## 2017-07-04 DIAGNOSIS — I10 Essential (primary) hypertension: Secondary | ICD-10-CM | POA: Diagnosis present

## 2017-07-04 HISTORY — PX: TRANSURETHRAL RESECTION OF PROSTATE: SHX73

## 2017-07-04 LAB — HEMOGLOBIN AND HEMATOCRIT, BLOOD
HCT: 41 % (ref 39.0–52.0)
Hemoglobin: 13.9 g/dL (ref 13.0–17.0)

## 2017-07-04 LAB — GLUCOSE, CAPILLARY: GLUCOSE-CAPILLARY: 147 mg/dL — AB (ref 65–99)

## 2017-07-04 SURGERY — TURP (TRANSURETHRAL RESECTION OF PROSTATE)
Anesthesia: General

## 2017-07-04 MED ORDER — BISACODYL 10 MG RE SUPP: 10.0000 mg | Freq: Every day | RECTAL | Status: DC | PRN

## 2017-07-04 MED ORDER — ONDANSETRON HCL 4 MG/2ML IJ SOLN: 4.0000 mg | INTRAMUSCULAR | Status: DC | PRN

## 2017-07-04 MED ORDER — METOPROLOL SUCCINATE ER 50 MG PO TB24
50.0000 mg | ORAL_TABLET | Freq: Two times a day (BID) | ORAL | Status: DC
  Administered 2017-07-04 – 2017-07-06 (×4): 50 mg via ORAL
  Filled 2017-07-04 (×5): qty 1

## 2017-07-04 MED ORDER — FENTANYL CITRATE (PF) 100 MCG/2ML IJ SOLN
INTRAMUSCULAR | Status: DC | PRN
  Administered 2017-07-04 (×3): 25 ug via INTRAVENOUS
  Administered 2017-07-04 (×2): 50 ug via INTRAVENOUS
  Administered 2017-07-04: 25 ug via INTRAVENOUS

## 2017-07-04 MED ORDER — ALBUTEROL SULFATE (2.5 MG/3ML) 0.083% IN NEBU: 2.5000 mg | INHALATION_SOLUTION | Freq: Four times a day (QID) | RESPIRATORY_TRACT | Status: DC | PRN

## 2017-07-04 MED ORDER — LISINOPRIL 10 MG PO TABS
10.0000 mg | ORAL_TABLET | Freq: Every evening | ORAL | Status: DC
  Administered 2017-07-04 – 2017-07-05 (×2): 10 mg via ORAL
  Filled 2017-07-04 (×3): qty 1

## 2017-07-04 MED ORDER — CEFAZOLIN SODIUM-DEXTROSE 2-4 GM/100ML-% IV SOLN
2.0000 g | INTRAVENOUS | Status: AC
  Administered 2017-07-04: 2 g via INTRAVENOUS
  Filled 2017-07-04: qty 100

## 2017-07-04 MED ORDER — FENTANYL CITRATE (PF) 100 MCG/2ML IJ SOLN: 25.0000 ug | INTRAMUSCULAR | Status: DC | PRN

## 2017-07-04 MED ORDER — PROPOFOL 10 MG/ML IV BOLUS
INTRAVENOUS | Status: DC | PRN
  Administered 2017-07-04: 200 mg via INTRAVENOUS

## 2017-07-04 MED ORDER — OXYCODONE HCL 5 MG PO TABS
ORAL_TABLET | ORAL | Status: AC
  Administered 2017-07-04: 5 mg via ORAL
  Filled 2017-07-04: qty 1

## 2017-07-04 MED ORDER — EPHEDRINE SULFATE-NACL 50-0.9 MG/10ML-% IV SOSY
PREFILLED_SYRINGE | INTRAVENOUS | Status: DC | PRN
  Administered 2017-07-04 (×3): 5 mg via INTRAVENOUS

## 2017-07-04 MED ORDER — EPHEDRINE 5 MG/ML INJ
INTRAVENOUS | Status: AC
  Filled 2017-07-04: qty 10

## 2017-07-04 MED ORDER — LIDOCAINE 2% (20 MG/ML) 5 ML SYRINGE
INTRAMUSCULAR | Status: DC | PRN
  Administered 2017-07-04: 100 mg via INTRAVENOUS

## 2017-07-04 MED ORDER — FENTANYL CITRATE (PF) 100 MCG/2ML IJ SOLN
INTRAMUSCULAR | Status: AC
  Filled 2017-07-04: qty 2

## 2017-07-04 MED ORDER — HYDROMORPHONE HCL 1 MG/ML IJ SOLN: 0.5000 mg | INTRAMUSCULAR | Status: DC | PRN

## 2017-07-04 MED ORDER — OXYCODONE HCL 5 MG PO TABS
5.0000 mg | ORAL_TABLET | ORAL | Status: DC | PRN
  Administered 2017-07-04 – 2017-07-06 (×5): 5 mg via ORAL
  Filled 2017-07-04 (×5): qty 1

## 2017-07-04 MED ORDER — FENTANYL CITRATE (PF) 100 MCG/2ML IJ SOLN
25.0000 ug | INTRAMUSCULAR | Status: DC | PRN
  Administered 2017-07-04: 25 ug via INTRAVENOUS
  Administered 2017-07-04: 50 ug via INTRAVENOUS

## 2017-07-04 MED ORDER — DOCUSATE SODIUM 100 MG PO CAPS
100.0000 mg | ORAL_CAPSULE | Freq: Two times a day (BID) | ORAL | Status: DC
  Administered 2017-07-04 – 2017-07-06 (×4): 100 mg via ORAL
  Filled 2017-07-04 (×4): qty 1

## 2017-07-04 MED ORDER — DEXAMETHASONE SODIUM PHOSPHATE 10 MG/ML IJ SOLN
INTRAMUSCULAR | Status: AC
  Filled 2017-07-04: qty 1

## 2017-07-04 MED ORDER — SENNOSIDES-DOCUSATE SODIUM 8.6-50 MG PO TABS: 1.0000 | ORAL_TABLET | Freq: Every evening | ORAL | Status: DC | PRN

## 2017-07-04 MED ORDER — ACETAMINOPHEN 325 MG PO TABS: 650.0000 mg | ORAL_TABLET | ORAL | Status: DC | PRN

## 2017-07-04 MED ORDER — POTASSIUM CHLORIDE IN NACL 20-0.45 MEQ/L-% IV SOLN
INTRAVENOUS | Status: DC
  Administered 2017-07-04 – 2017-07-06 (×4): via INTRAVENOUS
  Filled 2017-07-04 (×6): qty 1000

## 2017-07-04 MED ORDER — SODIUM CHLORIDE 0.9 % IR SOLN
Status: DC | PRN
  Administered 2017-07-04: 30000 mL via INTRAVESICAL

## 2017-07-04 MED ORDER — HYOSCYAMINE SULFATE 0.125 MG SL SUBL
0.1250 mg | SUBLINGUAL_TABLET | SUBLINGUAL | Status: DC | PRN
  Filled 2017-07-04: qty 1

## 2017-07-04 MED ORDER — LACTATED RINGERS IV SOLN
INTRAVENOUS | Status: DC
  Administered 2017-07-04 (×3): via INTRAVENOUS

## 2017-07-04 MED ORDER — ONDANSETRON HCL 4 MG/2ML IJ SOLN
INTRAMUSCULAR | Status: AC
  Filled 2017-07-04: qty 2

## 2017-07-04 MED ORDER — ZOLPIDEM TARTRATE 5 MG PO TABS
5.0000 mg | ORAL_TABLET | Freq: Every evening | ORAL | Status: DC | PRN
  Administered 2017-07-04 – 2017-07-05 (×2): 5 mg via ORAL
  Filled 2017-07-04 (×2): qty 1

## 2017-07-04 MED ORDER — DIPHENHYDRAMINE HCL 50 MG/ML IJ SOLN: 12.5000 mg | Freq: Four times a day (QID) | INTRAMUSCULAR | Status: DC | PRN

## 2017-07-04 MED ORDER — PROPOFOL 10 MG/ML IV BOLUS
INTRAVENOUS | Status: AC
  Filled 2017-07-04: qty 40

## 2017-07-04 MED ORDER — FENTANYL CITRATE (PF) 100 MCG/2ML IJ SOLN
INTRAMUSCULAR | Status: AC
  Filled 2017-07-04: qty 4

## 2017-07-04 MED ORDER — ONDANSETRON HCL 4 MG/2ML IJ SOLN
INTRAMUSCULAR | Status: DC | PRN
  Administered 2017-07-04: 4 mg via INTRAVENOUS

## 2017-07-04 MED ORDER — LIDOCAINE 2% (20 MG/ML) 5 ML SYRINGE
INTRAMUSCULAR | Status: AC
  Filled 2017-07-04: qty 5

## 2017-07-04 MED ORDER — DEXAMETHASONE SODIUM PHOSPHATE 4 MG/ML IJ SOLN
INTRAMUSCULAR | Status: DC | PRN
  Administered 2017-07-04: 8 mg via INTRAVENOUS

## 2017-07-04 MED ORDER — FLEET ENEMA 7-19 GM/118ML RE ENEM: 1.0000 | ENEMA | Freq: Once | RECTAL | Status: DC | PRN

## 2017-07-04 MED ORDER — DIPHENHYDRAMINE HCL 12.5 MG/5ML PO ELIX: 12.5000 mg | ORAL_SOLUTION | Freq: Four times a day (QID) | ORAL | Status: DC | PRN

## 2017-07-04 SURGICAL SUPPLY — 19 items
BAG URINE DRAINAGE (UROLOGICAL SUPPLIES) ×2 IMPLANT
BAG URO CATCHER STRL LF (MISCELLANEOUS) ×3 IMPLANT
CATH FOLEY 3WAY 30CC 22FR (CATHETERS) ×2 IMPLANT
COVER FOOTSWITCH UNIV (MISCELLANEOUS) ×2 IMPLANT
COVER SURGICAL LIGHT HANDLE (MISCELLANEOUS) ×1 IMPLANT
ELECT REM PT RETURN 15FT ADLT (MISCELLANEOUS) ×1 IMPLANT
GLOVE SURG SS PI 8.0 STRL IVOR (GLOVE) ×2 IMPLANT
GOWN STRL REUS W/TWL XL LVL3 (GOWN DISPOSABLE) ×3 IMPLANT
HOLDER FOLEY CATH W/STRAP (MISCELLANEOUS) ×2 IMPLANT
LOOP CUT BIPOLAR 24F LRG (ELECTROSURGICAL) ×2 IMPLANT
MANIFOLD NEPTUNE II (INSTRUMENTS) ×3 IMPLANT
NS IRRIG 1000ML POUR BTL (IV SOLUTION) ×2 IMPLANT
PACK CYSTO (CUSTOM PROCEDURE TRAY) ×3 IMPLANT
SET ASPIRATION TUBING (TUBING) ×1 IMPLANT
SYR 30ML LL (SYRINGE) ×2 IMPLANT
SYRINGE IRR TOOMEY STRL 70CC (SYRINGE) IMPLANT
TUBING CONNECTING 10 (TUBING) ×2 IMPLANT
TUBING CONNECTING 10' (TUBING) ×1
WATER STERILE IRR 500ML POUR (IV SOLUTION) ×2 IMPLANT

## 2017-07-04 NOTE — Discharge Instructions (Signed)
Transurethral Resection of the Prostate, Care After °Refer to this sheet in the next few weeks. These instructions provide you with information about caring for yourself after your procedure. Your health care provider may also give you more specific instructions. Your treatment has been planned according to current medical practices, but problems sometimes occur. Call your health care provider if you have any problems or questions after your procedure. °What can I expect after the procedure? °After the procedure, it is common to have: °· Mild pain in your lower abdomen. °· Soreness or mild discomfort in your penis from having the catheter inserted during the procedure. °· A feeling of urgency when you need to urinate. °· A small amount of blood in your urine. You may notice some small blood clots in your urine. These are normal. ° °Follow these instructions at home: °Medicines ° °· Take over-the-counter and prescription medicines only as told by your health care provider. °· Do not drive or operate heavy machinery while taking prescription pain medicine. °· Do not drive for 24 hours if you received a sedative. °· If you were prescribed antibiotic medicine, take it as told by your health care provider. Do not stop taking the antibiotic even if you start to feel better. °Activity °· Return to your normal activities as told by your health care provider. Ask your health care provider what activities are safe for you. °· Do not lift anything that is heavier than 10 lb (4.5 kg) for 3 weeks after your procedure, or as long as told by your health care provider. °· Avoid intense physical activity for as long as told by your health care provider. °· Walk at least one time every day. This helps to prevent blood clots. You may increase your physical activity gradually as you start to feel better. °Lifestyle °· Do not drink alcohol for as long as told by your health care provider. This is especially important if you are taking  prescription pain medicines. °· Do not engage in sexual activity until your health care provider says that you can do this. °General instructions °· Do not take baths, swim, or use a hot tub until your health care provider approves. °· Drink enough fluid to keep your urine clear or pale yellow. °· Urinate as soon as you feel the need to. Do not try to hold your urine for long periods of time. °· If your health care provider approves, you may take a stool softener for 2-3 weeks to prevent you from straining to have a bowel movement. °· Wear compression stockings as told by your health care provider. These stockings help to prevent blood clots and reduce swelling in your legs. °· Keep all follow-up visits as told by your health care provider. This is important. °Contact a health care provider if: °· You have difficulty urinating. °· You have a fever. °· You have pain that gets worse or does not improve with medicine. °· You have blood in your urine that does not go away after 1 week of resting and drinking more fluids. °· You have swelling in your penis or testicles. °Get help right away if: °· You are unable to urinate. °· You are having more blood clots in your urine instead of fewer. °· You have: °? Large blood clots. °? A lot of blood in your urine. °? Pain in your back or lower abdomen. °? Pain or swelling in your legs. °? Chills and you are shaking. °This information is not intended to   replace advice given to you by your health care provider. Make sure you discuss any questions you have with your health care provider. °Document Released: 07/18/2005 Document Revised: 03/20/2016 Document Reviewed: 04/09/2015 °Elsevier Interactive Patient Education © 2017 [employer] ° °

## 2017-07-04 NOTE — Anesthesia Procedure Notes (Signed)
Procedure Name: LMA Insertion Date/Time: 07/04/2017 9:13 AM Performed by: Claudia Desanctis, CRNA Pre-anesthesia Checklist: Emergency Drugs available, Patient identified, Suction available and Patient being monitored Patient Re-evaluated:Patient Re-evaluated prior to induction Oxygen Delivery Method: Circle system utilized Preoxygenation: Pre-oxygenation with 100% oxygen Induction Type: IV induction Ventilation: Mask ventilation without difficulty LMA: LMA with gastric port inserted LMA Size: 4.0 Number of attempts: 1 Placement Confirmation: positive ETCO2 and breath sounds checked- equal and bilateral Tube secured with: Tape Dental Injury: Teeth and Oropharynx as per pre-operative assessment

## 2017-07-04 NOTE — Op Note (Signed)
Preoperative diagnosis: 1. Bladder outlet obstruction secondary to BPH  Postoperative diagnosis:  1. Bladder outlet obstruction secondary to BPH  Procedure:  1. Cystoscopy 2. Transurethral Resection of the prostate  Surgeon: Irine Seal. M.D.  Anesthesia: general  Complications: None  EBL: 576ml  Specimens: 1. Prostate chips  Disposition of specimens: Pathology  Indication: Kevin Hooper is a patient with bladder outlet obstruction secondary to benign prostatic hyperplasia. After reviewing the management options for treatment, he elected to proceed with the above surgical procedure(s). We have discussed the potential benefits and risks of the procedure, side effects of the proposed treatment, the likelihood of the patient achieving the goals of the procedure, and any potential problems that might occur during the procedure or recuperation. Informed consent has been obtained.  Description of procedure:  The patient was taken to the operating room and general anesthesia was induced.  The patient was placed in the dorsal lithotomy position, prepped and draped in the usual sterile fashion, and preoperative antibiotics were administered. A preoperative time-out was performed.   Cystourethroscopy was performed.  The patient's urethra was examined and demonstrated bilobar prostatic hypertrophy with a 5cm urethral length. The bladder was then systematically examined in its entirety. There was moderate trabeculation with no evidence of any bladder tumors, stones, or other mucosal pathology.  The ureteral orifices were identified and marked so as to be avoided during the procedure.  The prostate adenoma was then resected utilizing loop cautery resection with the bipolar cutting loop.  The prostate adenoma from the bladder neck back to the verumontanum was resected beginning at the six o'clock position and then extended to include the right and left lobes of the prostate and anterior  prostate. Care was taken not to resect distal to the verumontanum.  At the completion of the procedure the bladder was evacuated free of chips and hemostasis was insured.  Final inspection revealed intact ureteral orifices, a widely patent TUR channel and an intact external sphincter.   Hemostasis was then achieved with the cautery and the bladder was emptied and reinspected with no significant bleeding noted at the end of the procedure.    A 7fr 3 way catheter was then placed into the bladder and placed on continuous bladder irrigation.  The patient appeared to tolerate the procedure well and without complications.  The patient was able to be awakened and transferred to the recovery unit in satisfactory condition.

## 2017-07-04 NOTE — Anesthesia Postprocedure Evaluation (Signed)
Anesthesia Post Note  Patient: Kevin Hooper  Procedure(s) Performed: TRANSURETHRAL RESECTION OF THE PROSTATE (TURP) (N/A )     Patient location during evaluation: PACU Anesthesia Type: General Level of consciousness: awake and alert Pain management: pain level controlled Vital Signs Assessment: post-procedure vital signs reviewed and stable Respiratory status: spontaneous breathing, nonlabored ventilation, respiratory function stable and patient connected to nasal cannula oxygen Cardiovascular status: blood pressure returned to baseline and stable Postop Assessment: no apparent nausea or vomiting Anesthetic complications: no    Last Vitals:  Vitals:   07/04/17 1315 07/04/17 1334  BP: (!) 149/90 129/83  Pulse: 73 73  Resp: 12 12  Temp:  36.7 C  SpO2: 99% 99%    Last Pain:  Vitals:   07/04/17 1340  TempSrc:   PainSc: 1                  Murrel Freet EDWARD

## 2017-07-04 NOTE — Anesthesia Preprocedure Evaluation (Signed)
Anesthesia Evaluation  Patient identified by MRN, date of birth, ID band Patient awake    Reviewed: Allergy & Precautions, H&P , Patient's Chart, lab work & pertinent test results  Airway Mallampati: II  TM Distance: >3 FB Neck ROM: full    Dental no notable dental hx.    Pulmonary former smoker,    Pulmonary exam normal breath sounds clear to auscultation       Cardiovascular Exercise Tolerance: Good hypertension,  Rhythm:regular Rate:Normal     Neuro/Psych    GI/Hepatic   Endo/Other    Renal/GU      Musculoskeletal   Abdominal   Peds  Hematology   Anesthesia Other Findings   Reproductive/Obstetrics                             Anesthesia Physical Anesthesia Plan  ASA: II  Anesthesia Plan: General   Post-op Pain Management:    Induction: Intravenous  PONV Risk Score and Plan: 1 and Dexamethasone, Ondansetron and Treatment may vary due to age or medical condition  Airway Management Planned: LMA  Additional Equipment:   Intra-op Plan:   Post-operative Plan:   Informed Consent: I have reviewed the patients History and Physical, chart, labs and discussed the procedure including the risks, benefits and alternatives for the proposed anesthesia with the patient or authorized representative who has indicated his/her understanding and acceptance.     Plan Discussed with: CRNA and Surgeon  Anesthesia Plan Comments: ( )        Anesthesia Quick Evaluation

## 2017-07-04 NOTE — Transfer of Care (Signed)
Immediate Anesthesia Transfer of Care Note  Patient: Kevin Hooper  Procedure(s) Performed: TRANSURETHRAL RESECTION OF THE PROSTATE (TURP) (N/A )  Patient Location: PACU  Anesthesia Type:General  Level of Consciousness: awake, alert  and oriented  Airway & Oxygen Therapy: Patient Spontanous Breathing and Patient connected to face mask oxygen  Post-op Assessment: Report given to RN and Post -op Vital signs reviewed and stable  Post vital signs: Reviewed and stable  Last Vitals:  Vitals:   07/04/17 0651  BP: (!) 161/90  Pulse: 67  Resp: 18  Temp: 36.4 C  SpO2: 98%    Last Pain:  Vitals:   07/04/17 0651  TempSrc: Oral  PainSc: 4       Patients Stated Pain Goal: 4 (68/11/57 2620)  Complications: No apparent anesthesia complications

## 2017-07-04 NOTE — Interval H&P Note (Signed)
History and Physical Interval Note:  07/04/2017 8:39 AM  Kevin Hooper  has presented today for surgery, with the diagnosis of [place]  The various methods of treatment have been discussed with the patient and family. After consideration of risks, benefits and other options for treatment, the patient has consented to  Procedure(s): TRANSURETHRAL RESECTION OF THE PROSTATE (TURP) (N/A) as a surgical intervention .  The patient's history has been reviewed, patient examined, no change in status, stable for surgery.  I have reviewed the patient's chart and labs.  Questions were answered to the patient's satisfaction.     Irine Seal

## 2017-07-05 ENCOUNTER — Encounter (HOSPITAL_COMMUNITY): Payer: Self-pay | Admitting: Urology

## 2017-07-05 DIAGNOSIS — M1990 Unspecified osteoarthritis, unspecified site: Secondary | ICD-10-CM | POA: Diagnosis present

## 2017-07-05 DIAGNOSIS — R350 Frequency of micturition: Secondary | ICD-10-CM | POA: Diagnosis present

## 2017-07-05 DIAGNOSIS — R351 Nocturia: Secondary | ICD-10-CM | POA: Diagnosis present

## 2017-07-05 DIAGNOSIS — R32 Unspecified urinary incontinence: Secondary | ICD-10-CM | POA: Diagnosis present

## 2017-07-05 DIAGNOSIS — N320 Bladder-neck obstruction: Secondary | ICD-10-CM | POA: Diagnosis present

## 2017-07-05 DIAGNOSIS — Z79899 Other long term (current) drug therapy: Secondary | ICD-10-CM | POA: Diagnosis not present

## 2017-07-05 DIAGNOSIS — I10 Essential (primary) hypertension: Secondary | ICD-10-CM | POA: Diagnosis present

## 2017-07-05 DIAGNOSIS — E118 Type 2 diabetes mellitus with unspecified complications: Secondary | ICD-10-CM | POA: Diagnosis present

## 2017-07-05 DIAGNOSIS — N401 Enlarged prostate with lower urinary tract symptoms: Secondary | ICD-10-CM | POA: Diagnosis present

## 2017-07-05 DIAGNOSIS — Z87891 Personal history of nicotine dependence: Secondary | ICD-10-CM | POA: Diagnosis not present

## 2017-07-05 DIAGNOSIS — N4289 Other specified disorders of prostate: Secondary | ICD-10-CM | POA: Diagnosis present

## 2017-07-05 DIAGNOSIS — R3911 Hesitancy of micturition: Secondary | ICD-10-CM | POA: Diagnosis present

## 2017-07-05 DIAGNOSIS — R3915 Urgency of urination: Secondary | ICD-10-CM | POA: Diagnosis present

## 2017-07-05 DIAGNOSIS — D62 Acute posthemorrhagic anemia: Secondary | ICD-10-CM | POA: Diagnosis not present

## 2017-07-05 DIAGNOSIS — E785 Hyperlipidemia, unspecified: Secondary | ICD-10-CM | POA: Diagnosis present

## 2017-07-05 DIAGNOSIS — N138 Other obstructive and reflux uropathy: Secondary | ICD-10-CM | POA: Diagnosis present

## 2017-07-05 DIAGNOSIS — Z9049 Acquired absence of other specified parts of digestive tract: Secondary | ICD-10-CM | POA: Diagnosis not present

## 2017-07-05 DIAGNOSIS — Z7982 Long term (current) use of aspirin: Secondary | ICD-10-CM | POA: Diagnosis not present

## 2017-07-05 DIAGNOSIS — R3912 Poor urinary stream: Secondary | ICD-10-CM | POA: Diagnosis present

## 2017-07-05 DIAGNOSIS — Z96653 Presence of artificial knee joint, bilateral: Secondary | ICD-10-CM | POA: Diagnosis present

## 2017-07-05 LAB — GLUCOSE, CAPILLARY
GLUCOSE-CAPILLARY: 122 mg/dL — AB (ref 65–99)
Glucose-Capillary: 180 mg/dL — ABNORMAL HIGH (ref 65–99)
Glucose-Capillary: 152 mg/dL — ABNORMAL HIGH (ref 65–99)
Glucose-Capillary: 167 mg/dL — ABNORMAL HIGH (ref 65–99)

## 2017-07-05 LAB — HEMOGLOBIN AND HEMATOCRIT, BLOOD
HEMATOCRIT: 38.4 % — AB (ref 39.0–52.0)
HEMOGLOBIN: 12.8 g/dL — AB (ref 13.0–17.0)

## 2017-07-05 LAB — BASIC METABOLIC PANEL
Anion gap: 4 — ABNORMAL LOW (ref 5–15)
BUN: 36 mg/dL — AB (ref 6–20)
CALCIUM: 8.1 mg/dL — AB (ref 8.9–10.3)
CO2: 24 mmol/L (ref 22–32)
CREATININE: 1.49 mg/dL — AB (ref 0.61–1.24)
Chloride: 106 mmol/L (ref 101–111)
GFR calc Af Amer: 53 mL/min — ABNORMAL LOW (ref >60)
GFR, EST NON AFRICAN AMERICAN: 46 mL/min — AB (ref >60)
Glucose, Bld: 139 mg/dL — ABNORMAL HIGH (ref 65–99)
POTASSIUM: 4.6 mmol/L (ref 3.5–5.1)
SODIUM: 134 mmol/L — AB (ref 135–145)

## 2017-07-05 NOTE — Progress Notes (Signed)
1 Day Post-Op  Subjective: He is doing well without complaints and his urine is clear on minimal CBI.   HIs Cr is up some and has a mild ABL anemia.    ROS:  Review of Systems  All other systems reviewed and are negative.   Anti-infectives: Anti-infectives (From admission, onward)   Start     Dose/Rate Route Frequency Ordered Stop   07/04/17 0709  ceFAZolin (ANCEF) IVPB 2g/100 mL premix     2 g 200 mL/hr over 30 Minutes Intravenous 30 min pre-op 07/04/17 0709 07/04/17 0924      Current Facility-Administered Medications  Medication Dose Route Frequency Provider Last Rate Last Dose  . 0.45 % NaCl with KCl 20 mEq / L infusion   Intravenous Continuous Irine Seal, MD 100 mL/hr at 07/05/17 0233    . acetaminophen (TYLENOL) tablet 650 mg  650 mg Oral Q4H PRN Irine Seal, MD      . albuterol (PROVENTIL) (2.5 MG/3ML) 0.083% nebulizer solution 2.5 mg  2.5 mg Nebulization Q6H PRN Irine Seal, MD      . bisacodyl (DULCOLAX) suppository 10 mg  10 mg Rectal Daily PRN Irine Seal, MD      . diphenhydrAMINE (BENADRYL) injection 12.5 mg  12.5 mg Intravenous Q6H PRN Irine Seal, MD       Or  . diphenhydrAMINE (BENADRYL) 12.5 MG/5ML elixir 12.5 mg  12.5 mg Oral Q6H PRN Irine Seal, MD      . docusate sodium (COLACE) capsule 100 mg  100 mg Oral BID Irine Seal, MD   100 mg at 07/04/17 2313  . HYDROmorphone (DILAUDID) injection 0.5-1 mg  0.5-1 mg Intravenous Q2H PRN Irine Seal, MD      . hyoscyamine (LEVSIN SL) SL tablet 0.125 mg  0.125 mg Sublingual Q4H PRN Irine Seal, MD      . lactated ringers infusion   Intravenous Continuous Lyndle Herrlich, MD 75 mL/hr at 07/04/17 0720    . lisinopril (PRINIVIL,ZESTRIL) tablet 10 mg  10 mg Oral QPM Irine Seal, MD   10 mg at 07/04/17 1633  . metoprolol succinate (TOPROL-XL) 24 hr tablet 50 mg  50 mg Oral BID Irine Seal, MD   50 mg at 07/04/17 2313  . ondansetron (ZOFRAN) injection 4 mg  4 mg Intravenous Q4H PRN Irine Seal, MD      . oxyCODONE (Oxy  IR/ROXICODONE) immediate release tablet 5 mg  5 mg Oral Q4H PRN Irine Seal, MD   5 mg at 07/04/17 2313  . senna-docusate (Senokot-S) tablet 1 tablet  1 tablet Oral QHS PRN Irine Seal, MD      . sodium phosphate (FLEET) 7-19 GM/118ML enema 1 enema  1 enema Rectal Once PRN Irine Seal, MD      . zolpidem (AMBIEN) tablet 5 mg  5 mg Oral QHS PRN Irine Seal, MD   5 mg at 07/04/17 2313     Objective: Vital signs in last 24 hours: Temp:  [97.7 F (36.5 C)-98.2 F (36.8 C)] 98 F (36.7 C) (12/05 0440) Pulse Rate:  [68-78] 68 (12/05 0440) Resp:  [8-22] 16 (12/05 0440) BP: (123-158)/(69-108) 123/89 (12/05 0440) SpO2:  [98 %-100 %] 99 % (12/05 0440)  Intake/Output from previous day: 12/04 0701 - 12/05 0700 In: 8417.1 [P.O.:120; I.V.:3297.1; IV Piggyback:100] Out: 8100 [Urine:8000; Blood:100] Intake/Output this shift: No intake/output data recorded.   Physical Exam  Constitutional: He is well-developed, well-nourished, and in no distress.  Genitourinary:  Genitourinary Comments: Urine clear in tube  Vitals reviewed.  Lab Results:  Recent Labs    07/04/17 1159 07/05/17 0427  HGB 13.9 12.8*  HCT 41.0 38.4*   BMET Recent Labs    07/05/17 0427  NA 134*  K 4.6  CL 106  CO2 24  GLUCOSE 139*  BUN 36*  CREATININE 1.49*  CALCIUM 8.1*   PT/INR No results for input(s): LABPROT, INR in the last 72 hours. ABG No results for input(s): PHART, HCO3 in the last 72 hours.  Invalid input(s): PCO2, PO2  Studies/Results: No results found.   Assessment and Plan: Doing well post TURP but has new ARI and mild anemia.   I will d/c foley in AM.  Heplock IV.  Repeat BMP in am.       LOS: 0 days    Irine Seal 07/05/2017 [phone] ID: Kevin Hooper, male   DOB: 31-May-1946, 71 y.o.   MRN: [id number]

## 2017-07-05 NOTE — Plan of Care (Signed)
  Education: Knowledge of General Education information will improve 07/05/2017 2153 - Progressing by Ashley Murrain, RN   Clinical Measurements: Ability to maintain clinical measurements within normal limits will improve 07/05/2017 2153 - Progressing by Ashley Murrain, RN   Pain Managment: General experience of comfort will improve 07/05/2017 2153 - Progressing by Ashley Murrain, RN   Nutrition: Adequate nutrition will be maintained 07/05/2017 2153 - Progressing by Ashley Murrain, RN

## 2017-07-06 LAB — BASIC METABOLIC PANEL
Anion gap: 7 (ref 5–15)
BUN: 29 mg/dL — AB (ref 6–20)
CALCIUM: 8.4 mg/dL — AB (ref 8.9–10.3)
CO2: 24 mmol/L (ref 22–32)
CREATININE: 1.28 mg/dL — AB (ref 0.61–1.24)
Chloride: 105 mmol/L (ref 101–111)
GFR calc Af Amer: >60 mL/min (ref >60)
GFR, EST NON AFRICAN AMERICAN: 55 mL/min — AB (ref >60)
GLUCOSE: 139 mg/dL — AB (ref 65–99)
Potassium: 4.9 mmol/L (ref 3.5–5.1)
SODIUM: 136 mmol/L (ref 135–145)

## 2017-07-06 LAB — GLUCOSE, CAPILLARY: GLUCOSE-CAPILLARY: 119 mg/dL — AB (ref 65–99)

## 2017-07-06 MED ORDER — DOCUSATE SODIUM 100 MG PO CAPS: 100.0000 mg | ORAL_CAPSULE | Freq: Two times a day (BID) | ORAL | 2 refills | Status: DC

## 2017-07-06 NOTE — Discharge Summary (Signed)
Physician Discharge Summary  Patient ID: Kevin Hooper MRN: [id number] DOB/AGE: 04-Oct-1945 71 y.o.  Admit date: 07/04/2017 Discharge date: 07/06/2017  Admission Diagnoses:  BPH with obstruction/lower urinary tract symptoms  Discharge Diagnoses:  Principal Problem:   BPH with obstruction/lower urinary tract symptoms   Past Medical History:  Diagnosis Date  . Arthritis   . Cataracts, bilateral 11/2016  . Hyperlipidemia   . Hypertension   . Joint ache   . Prostate atrophy 2018    Surgeries: Procedure(s): TRANSURETHRAL RESECTION OF THE PROSTATE (TURP) on 07/04/2017   Consultants (if any):   Discharged Condition: Improved  Hospital Course: Kevin Hooper is an 71 y.o. male who was admitted 07/04/2017 with a diagnosis of BPH with obstruction/lower urinary tract symptoms and went to the operating room on 07/04/2017 and underwent the above named procedures.  His foley was left in an extra day because of his large prostate.  He had a bump in the Cr on POD 1 but that was declining at DC to 1.28.   His foley was removed on 12/6 and he is voiding well but has persistent frequency which is not surprising.   He was reassured.     He was given perioperative antibiotics:  Anti-infectives (From admission, onward)   Start     Dose/Rate Route Frequency Ordered Stop   07/04/17 0709  ceFAZolin (ANCEF) IVPB 2g/100 mL premix     2 g 200 mL/hr over 30 Minutes Intravenous 30 min pre-op 07/04/17 0709 07/04/17 0924    .  He was given sequential compression devices,  for DVT prophylaxis.  He benefited maximally from the hospital stay and there were no complications.    Recent vital signs:  Vitals:   07/05/17 1959 07/06/17 0623  BP: (!) 149/77 (!) 156/73  Pulse: 71 72  Resp: 18 20  Temp: 97.8 F (36.6 C) 98.5 F (36.9 C)  SpO2: 97% 97%    Recent laboratory studies:  Lab Results  Component Value Date   HGB 12.8 (L) 07/05/2017   HGB 13.9 07/04/2017   HGB 14.7 06/30/2017   Lab  Results  Component Value Date   WBC 8.4 06/30/2017   PLT 205 06/30/2017   No results found for: INR Lab Results  Component Value Date   NA 136 07/06/2017   K 4.9 07/06/2017   CL 105 07/06/2017   CO2 24 07/06/2017   BUN 29 (H) 07/06/2017   CREATININE 1.28 (H) 07/06/2017   GLUCOSE 139 (H) 07/06/2017    Discharge Medications:   Allergies as of 07/06/2017   No Known Allergies     Medication List    TAKE these medications   acetaminophen 500 MG tablet Commonly known as:  TYLENOL Take 500 mg by mouth every 8 (eight) hours as needed for mild pain or headache.   albuterol 108 (90 Base) MCG/ACT inhaler Commonly known as:  PROVENTIL HFA;VENTOLIN HFA Inhale 2 puffs into the lungs every 6 (six) hours as needed for wheezing or shortness of breath.   aspirin EC 81 MG tablet Take 81 mg by mouth daily.   diclofenac sodium 1 % Gel Commonly known as:  VOLTAREN Apply 1 application topically 4 (four) times daily. What changed:    when to take this  reasons to take this   docusate sodium 100 MG capsule Commonly known as:  COLACE Take 1 capsule (100 mg total) by mouth 2 (two) times daily.   lisinopril 10 MG tablet Commonly known as:  PRINIVIL,ZESTRIL Take 10 mg  by mouth every evening.   metoprolol succinate 50 MG 24 hr tablet Commonly known as:  TOPROL-XL TAKE 1 TABLET BY MOUTH TWICE DAILY   multivitamin with minerals Tabs tablet Take 1 tablet by mouth daily.   naproxen sodium 220 MG tablet Commonly known as:  ALEVE Take 220 mg by mouth 2 (two) times daily as needed (pain).   vitamin C 1000 MG tablet Take 2,000 mg by mouth daily.       Diagnostic Studies: No results found.  Disposition: 01-Home or Self Care  Discharge Instructions    Discontinue IV   Complete by:  As directed       Follow-up Information    Irine Seal, MD Follow up.   Specialty:  Urology Why:  as scheduled in [place].  Contact information: [address] [place]  [address] [phone]            Signed: Irine Seal 07/06/2017, 8:21 AM

## 2017-07-21 ENCOUNTER — Ambulatory Visit (INDEPENDENT_AMBULATORY_CARE_PROVIDER_SITE_OTHER): Payer: PPO | Admitting: Urology

## 2017-07-21 DIAGNOSIS — R351 Nocturia: Secondary | ICD-10-CM

## 2017-07-21 DIAGNOSIS — E291 Testicular hypofunction: Secondary | ICD-10-CM

## 2017-07-21 DIAGNOSIS — N401 Enlarged prostate with lower urinary tract symptoms: Secondary | ICD-10-CM

## 2017-08-30 DIAGNOSIS — M1990 Unspecified osteoarthritis, unspecified site: Secondary | ICD-10-CM | POA: Diagnosis not present

## 2017-08-30 DIAGNOSIS — Z299 Encounter for prophylactic measures, unspecified: Secondary | ICD-10-CM | POA: Diagnosis not present

## 2017-08-30 DIAGNOSIS — M19042 Primary osteoarthritis, left hand: Secondary | ICD-10-CM | POA: Diagnosis not present

## 2017-08-30 DIAGNOSIS — I10 Essential (primary) hypertension: Secondary | ICD-10-CM | POA: Diagnosis not present

## 2017-08-30 DIAGNOSIS — Z87891 Personal history of nicotine dependence: Secondary | ICD-10-CM | POA: Diagnosis not present

## 2017-08-30 DIAGNOSIS — Z6830 Body mass index (BMI) 30.0-30.9, adult: Secondary | ICD-10-CM | POA: Diagnosis not present

## 2017-08-30 DIAGNOSIS — J439 Emphysema, unspecified: Secondary | ICD-10-CM | POA: Diagnosis not present

## 2017-08-30 DIAGNOSIS — M19041 Primary osteoarthritis, right hand: Secondary | ICD-10-CM | POA: Diagnosis not present

## 2017-08-30 DIAGNOSIS — E1165 Type 2 diabetes mellitus with hyperglycemia: Secondary | ICD-10-CM | POA: Diagnosis not present

## 2017-10-06 DIAGNOSIS — N401 Enlarged prostate with lower urinary tract symptoms: Secondary | ICD-10-CM | POA: Diagnosis not present

## 2017-10-06 DIAGNOSIS — E291 Testicular hypofunction: Secondary | ICD-10-CM | POA: Diagnosis not present

## 2017-10-13 ENCOUNTER — Ambulatory Visit (INDEPENDENT_AMBULATORY_CARE_PROVIDER_SITE_OTHER): Payer: PPO | Admitting: Urology

## 2017-10-13 ENCOUNTER — Other Ambulatory Visit (HOSPITAL_COMMUNITY)
Admission: RE | Admit: 2017-10-13 | Discharge: 2017-10-13 | Disposition: A | Discharge status: Home / Self Care | Payer: PPO | Source: Other Acute Inpatient Hospital | Attending: Urology | Admitting: Urology

## 2017-10-13 DIAGNOSIS — N401 Enlarged prostate with lower urinary tract symptoms: Secondary | ICD-10-CM | POA: Insufficient documentation

## 2017-10-13 DIAGNOSIS — R351 Nocturia: Secondary | ICD-10-CM | POA: Diagnosis not present

## 2017-10-13 DIAGNOSIS — R972 Elevated prostate specific antigen [PSA]: Secondary | ICD-10-CM | POA: Diagnosis not present

## 2017-10-13 DIAGNOSIS — N5201 Erectile dysfunction due to arterial insufficiency: Secondary | ICD-10-CM | POA: Diagnosis not present

## 2017-10-13 DIAGNOSIS — E230 Hypopituitarism: Secondary | ICD-10-CM

## 2017-10-13 LAB — URINALYSIS, COMPLETE (UACMP) WITH MICROSCOPIC
BACTERIA UA: NONE SEEN
Bilirubin Urine: NEGATIVE
GLUCOSE, UA: NEGATIVE mg/dL
Ketones, ur: NEGATIVE mg/dL
Nitrite: NEGATIVE
PROTEIN: 30 mg/dL — AB
SPECIFIC GRAVITY, URINE: 1.019 (ref 1.005–1.030)
pH: 5 (ref 5.0–8.0)

## 2017-10-15 LAB — URINE CULTURE: CULTURE: NO GROWTH

## 2017-10-20 DIAGNOSIS — R5383 Other fatigue: Secondary | ICD-10-CM | POA: Diagnosis not present

## 2017-10-20 DIAGNOSIS — Z1331 Encounter for screening for depression: Secondary | ICD-10-CM | POA: Diagnosis not present

## 2017-10-20 DIAGNOSIS — E1165 Type 2 diabetes mellitus with hyperglycemia: Secondary | ICD-10-CM | POA: Diagnosis not present

## 2017-10-20 DIAGNOSIS — Z7189 Other specified counseling: Secondary | ICD-10-CM | POA: Diagnosis not present

## 2017-10-20 DIAGNOSIS — Z79899 Other long term (current) drug therapy: Secondary | ICD-10-CM | POA: Diagnosis not present

## 2017-10-20 DIAGNOSIS — Z1339 Encounter for screening examination for other mental health and behavioral disorders: Secondary | ICD-10-CM | POA: Diagnosis not present

## 2017-10-20 DIAGNOSIS — Z1211 Encounter for screening for malignant neoplasm of colon: Secondary | ICD-10-CM | POA: Diagnosis not present

## 2017-10-20 DIAGNOSIS — Z0000 Encounter for general adult medical examination without abnormal findings: Secondary | ICD-10-CM | POA: Diagnosis not present

## 2017-10-20 DIAGNOSIS — J449 Chronic obstructive pulmonary disease, unspecified: Secondary | ICD-10-CM | POA: Diagnosis not present

## 2017-10-20 DIAGNOSIS — Z6830 Body mass index (BMI) 30.0-30.9, adult: Secondary | ICD-10-CM | POA: Diagnosis not present

## 2017-10-20 DIAGNOSIS — I10 Essential (primary) hypertension: Secondary | ICD-10-CM | POA: Diagnosis not present

## 2017-10-20 DIAGNOSIS — Z299 Encounter for prophylactic measures, unspecified: Secondary | ICD-10-CM | POA: Diagnosis not present

## 2017-10-27 DIAGNOSIS — Z125 Encounter for screening for malignant neoplasm of prostate: Secondary | ICD-10-CM | POA: Diagnosis not present

## 2017-10-27 DIAGNOSIS — Z79899 Other long term (current) drug therapy: Secondary | ICD-10-CM | POA: Diagnosis not present

## 2017-10-27 DIAGNOSIS — R5383 Other fatigue: Secondary | ICD-10-CM | POA: Diagnosis not present

## 2017-10-27 DIAGNOSIS — I10 Essential (primary) hypertension: Secondary | ICD-10-CM | POA: Diagnosis not present

## 2017-12-01 DIAGNOSIS — Z299 Encounter for prophylactic measures, unspecified: Secondary | ICD-10-CM | POA: Diagnosis not present

## 2017-12-01 DIAGNOSIS — Z6829 Body mass index (BMI) 29.0-29.9, adult: Secondary | ICD-10-CM | POA: Diagnosis not present

## 2017-12-01 DIAGNOSIS — M1990 Unspecified osteoarthritis, unspecified site: Secondary | ICD-10-CM | POA: Diagnosis not present

## 2017-12-01 DIAGNOSIS — E1165 Type 2 diabetes mellitus with hyperglycemia: Secondary | ICD-10-CM | POA: Diagnosis not present

## 2017-12-01 DIAGNOSIS — Z713 Dietary counseling and surveillance: Secondary | ICD-10-CM | POA: Diagnosis not present

## 2017-12-01 DIAGNOSIS — J449 Chronic obstructive pulmonary disease, unspecified: Secondary | ICD-10-CM | POA: Diagnosis not present

## 2018-01-12 ENCOUNTER — Ambulatory Visit: Payer: PPO | Admitting: Urology

## 2018-01-12 DIAGNOSIS — E291 Testicular hypofunction: Secondary | ICD-10-CM

## 2018-01-12 DIAGNOSIS — R972 Elevated prostate specific antigen [PSA]: Secondary | ICD-10-CM | POA: Diagnosis not present

## 2018-01-12 DIAGNOSIS — N5201 Erectile dysfunction due to arterial insufficiency: Secondary | ICD-10-CM

## 2018-01-12 DIAGNOSIS — N401 Enlarged prostate with lower urinary tract symptoms: Secondary | ICD-10-CM

## 2018-01-12 DIAGNOSIS — R351 Nocturia: Secondary | ICD-10-CM | POA: Diagnosis not present

## 2018-03-09 ENCOUNTER — Other Ambulatory Visit (HOSPITAL_COMMUNITY)
Admission: RE | Admit: 2018-03-09 | Discharge: 2018-03-09 | Disposition: A | Discharge status: Home / Self Care | Payer: PPO | Source: Other Acute Inpatient Hospital | Attending: Urology | Admitting: Urology

## 2018-03-09 ENCOUNTER — Ambulatory Visit (INDEPENDENT_AMBULATORY_CARE_PROVIDER_SITE_OTHER): Payer: PPO | Admitting: Urology

## 2018-03-09 DIAGNOSIS — E1165 Type 2 diabetes mellitus with hyperglycemia: Secondary | ICD-10-CM | POA: Diagnosis not present

## 2018-03-09 DIAGNOSIS — E291 Testicular hypofunction: Secondary | ICD-10-CM | POA: Diagnosis not present

## 2018-03-09 DIAGNOSIS — I10 Essential (primary) hypertension: Secondary | ICD-10-CM | POA: Diagnosis not present

## 2018-03-09 DIAGNOSIS — R310 Gross hematuria: Secondary | ICD-10-CM

## 2018-03-09 DIAGNOSIS — Z299 Encounter for prophylactic measures, unspecified: Secondary | ICD-10-CM | POA: Diagnosis not present

## 2018-03-09 DIAGNOSIS — Z713 Dietary counseling and surveillance: Secondary | ICD-10-CM | POA: Diagnosis not present

## 2018-03-09 DIAGNOSIS — J439 Emphysema, unspecified: Secondary | ICD-10-CM | POA: Diagnosis not present

## 2018-03-09 LAB — URINALYSIS, COMPLETE (UACMP) WITH MICROSCOPIC
BILIRUBIN URINE: NEGATIVE
GLUCOSE, UA: NEGATIVE mg/dL
KETONES UR: NEGATIVE mg/dL
NITRITE: NEGATIVE
PH: 5 (ref 5.0–8.0)
Protein, ur: 30 mg/dL — AB
RBC / HPF: >50 RBC/hpf — ABNORMAL HIGH (ref 0–5)
Specific Gravity, Urine: 1.017 (ref 1.005–1.030)

## 2018-03-11 LAB — URINE CULTURE: Culture: NO GROWTH

## 2018-03-16 DIAGNOSIS — R310 Gross hematuria: Secondary | ICD-10-CM | POA: Diagnosis not present

## 2018-03-20 ENCOUNTER — Other Ambulatory Visit: Payer: Self-pay | Admitting: Urology

## 2018-03-20 DIAGNOSIS — R310 Gross hematuria: Secondary | ICD-10-CM

## 2018-04-05 ENCOUNTER — Ambulatory Visit (HOSPITAL_COMMUNITY)
Admission: RE | Admit: 2018-04-05 | Discharge: 2018-04-05 | Disposition: A | Discharge status: Home / Self Care | Payer: PPO | Source: Ambulatory Visit | Attending: Urology | Admitting: Urology

## 2018-04-05 DIAGNOSIS — N400 Enlarged prostate without lower urinary tract symptoms: Secondary | ICD-10-CM | POA: Insufficient documentation

## 2018-04-05 DIAGNOSIS — I700 Atherosclerosis of aorta: Secondary | ICD-10-CM | POA: Insufficient documentation

## 2018-04-05 DIAGNOSIS — R310 Gross hematuria: Secondary | ICD-10-CM | POA: Insufficient documentation

## 2018-04-05 DIAGNOSIS — R590 Localized enlarged lymph nodes: Secondary | ICD-10-CM | POA: Diagnosis not present

## 2018-04-05 DIAGNOSIS — N433 Hydrocele, unspecified: Secondary | ICD-10-CM | POA: Insufficient documentation

## 2018-04-05 LAB — POCT I-STAT CREATININE: Creatinine, Ser: 1.3 mg/dL — ABNORMAL HIGH (ref 0.61–1.24)

## 2018-04-05 MED ORDER — IOPAMIDOL (ISOVUE-300) INJECTION 61%
125.0000 mL | Freq: Once | INTRAVENOUS | Status: AC | PRN
  Administered 2018-04-05: 125 mL via INTRAVENOUS

## 2018-04-05 MED ORDER — SODIUM CHLORIDE 0.9 % IV SOLN
INTRAVENOUS | Status: DC
Start: 2018-04-05 — End: 2018-04-06
  Filled 2018-04-05: qty 250

## 2018-04-27 ENCOUNTER — Ambulatory Visit (INDEPENDENT_AMBULATORY_CARE_PROVIDER_SITE_OTHER): Payer: PPO | Admitting: Urology

## 2018-04-27 DIAGNOSIS — N401 Enlarged prostate with lower urinary tract symptoms: Secondary | ICD-10-CM

## 2018-04-27 DIAGNOSIS — R310 Gross hematuria: Secondary | ICD-10-CM | POA: Diagnosis not present

## 2018-04-27 DIAGNOSIS — R590 Localized enlarged lymph nodes: Secondary | ICD-10-CM | POA: Diagnosis not present

## 2018-04-27 DIAGNOSIS — N200 Calculus of kidney: Secondary | ICD-10-CM | POA: Diagnosis not present

## 2018-05-04 ENCOUNTER — Other Ambulatory Visit: Payer: Self-pay

## 2018-05-04 ENCOUNTER — Inpatient Hospital Stay (HOSPITAL_COMMUNITY): Payer: PPO

## 2018-05-04 ENCOUNTER — Inpatient Hospital Stay (HOSPITAL_COMMUNITY): Payer: PPO | Attending: Hematology | Admitting: Hematology

## 2018-05-04 ENCOUNTER — Encounter (HOSPITAL_COMMUNITY): Payer: Self-pay | Admitting: Hematology

## 2018-05-04 VITALS — BP 128/69 | HR 62 | Temp 98.0°F | Resp 18 | Ht 67.0 in | Wt 187.4 lb

## 2018-05-04 DIAGNOSIS — R161 Splenomegaly, not elsewhere classified: Secondary | ICD-10-CM | POA: Diagnosis not present

## 2018-05-04 DIAGNOSIS — Z7982 Long term (current) use of aspirin: Secondary | ICD-10-CM

## 2018-05-04 DIAGNOSIS — Z9079 Acquired absence of other genital organ(s): Secondary | ICD-10-CM | POA: Diagnosis not present

## 2018-05-04 DIAGNOSIS — R319 Hematuria, unspecified: Secondary | ICD-10-CM

## 2018-05-04 DIAGNOSIS — N400 Enlarged prostate without lower urinary tract symptoms: Secondary | ICD-10-CM | POA: Diagnosis not present

## 2018-05-04 DIAGNOSIS — R591 Generalized enlarged lymph nodes: Secondary | ICD-10-CM

## 2018-05-04 DIAGNOSIS — C8593 Non-Hodgkin lymphoma, unspecified, intra-abdominal lymph nodes: Secondary | ICD-10-CM | POA: Diagnosis not present

## 2018-05-04 DIAGNOSIS — Z8042 Family history of malignant neoplasm of prostate: Secondary | ICD-10-CM | POA: Diagnosis not present

## 2018-05-04 DIAGNOSIS — I700 Atherosclerosis of aorta: Secondary | ICD-10-CM | POA: Diagnosis not present

## 2018-05-04 DIAGNOSIS — N433 Hydrocele, unspecified: Secondary | ICD-10-CM | POA: Insufficient documentation

## 2018-05-04 DIAGNOSIS — Z801 Family history of malignant neoplasm of trachea, bronchus and lung: Secondary | ICD-10-CM

## 2018-05-04 DIAGNOSIS — Z79899 Other long term (current) drug therapy: Secondary | ICD-10-CM | POA: Insufficient documentation

## 2018-05-04 DIAGNOSIS — Z87891 Personal history of nicotine dependence: Secondary | ICD-10-CM | POA: Diagnosis not present

## 2018-05-04 DIAGNOSIS — I10 Essential (primary) hypertension: Secondary | ICD-10-CM | POA: Diagnosis not present

## 2018-05-04 DIAGNOSIS — R590 Localized enlarged lymph nodes: Secondary | ICD-10-CM

## 2018-05-04 LAB — CBC WITH DIFFERENTIAL/PLATELET
Basophils Absolute: 0.1 10*3/uL (ref 0.0–0.1)
Basophils Relative: 1 %
Eosinophils Absolute: 0.5 10*3/uL (ref 0.0–0.7)
Eosinophils Relative: 5 %
HEMATOCRIT: 38.6 % — AB (ref 39.0–52.0)
HEMOGLOBIN: 13.5 g/dL (ref 13.0–17.0)
LYMPHS ABS: 1.8 10*3/uL (ref 0.7–4.0)
LYMPHS PCT: 22 %
MCH: 31 pg (ref 26.0–34.0)
MCHC: 35 g/dL (ref 30.0–36.0)
MCV: 88.5 fL (ref 78.0–100.0)
MONOS PCT: 9 %
Monocytes Absolute: 0.7 10*3/uL (ref 0.1–1.0)
NEUTROS ABS: 5.3 10*3/uL (ref 1.7–7.7)
Neutrophils Relative %: 63 %
Platelets: 208 10*3/uL (ref 150–400)
RBC: 4.36 MIL/uL (ref 4.22–5.81)
RDW: 12.7 % (ref 11.5–15.5)
WBC: 8.4 10*3/uL (ref 4.0–10.5)

## 2018-05-04 LAB — COMPREHENSIVE METABOLIC PANEL
ALBUMIN: 3.8 g/dL (ref 3.5–5.0)
ALK PHOS: 61 U/L (ref 38–126)
ALT: 23 U/L (ref 0–44)
ANION GAP: 7 (ref 5–15)
AST: 17 U/L (ref 15–41)
BILIRUBIN TOTAL: 0.8 mg/dL (ref 0.3–1.2)
BUN: 35 mg/dL — ABNORMAL HIGH (ref 8–23)
CO2: 24 mmol/L (ref 22–32)
CREATININE: 1.53 mg/dL — AB (ref 0.61–1.24)
Calcium: 8.8 mg/dL — ABNORMAL LOW (ref 8.9–10.3)
Chloride: 110 mmol/L (ref 98–111)
GFR calc Af Amer: 51 mL/min — ABNORMAL LOW (ref >60)
GFR calc non Af Amer: 44 mL/min — ABNORMAL LOW (ref >60)
GLUCOSE: 83 mg/dL (ref 70–99)
Potassium: 4.3 mmol/L (ref 3.5–5.1)
SODIUM: 141 mmol/L (ref 135–145)
Total Protein: 7.4 g/dL (ref 6.5–8.1)

## 2018-05-04 LAB — LACTATE DEHYDROGENASE: LDH: 142 U/L (ref 98–192)

## 2018-05-04 NOTE — Assessment & Plan Note (Signed)
1.  Lymphadenopathy and splenomegaly: - Recent CT for hematuria work-up on 04/05/2018 showed several retroperitoneal lymph nodes measuring 1.2 cm and pelvic lymph nodes bilaterally, largest in the left obturator region measuring 2.2 cm. -Patient does not have any B symptoms including fevers, night sweats or weight loss. - I have reviewed his CT scan from 2010 done at [facility] which also showed lymphadenopathy and splenomegaly. - He does not have any significant palpable adenopathy.  Most likely diagnosis is low-grade lymphoma.  Will do a PET CT scan to confirm this. -We can delay biopsy as he is not having any symptoms.  He does have BPH without any history of prostate cancer. -We will do a CBC, CMP and LDH level today.  We will see him back in 4 weeks for follow-up.

## 2018-05-04 NOTE — Patient Instructions (Signed)
[place] at [facility] Discharge Instructions  PET SCAN Follow up after your scan.   Thank you for choosing [place] at [facility] to provide your oncology and hematology care.  To afford each patient quality time with our provider, please arrive at least 15 minutes before your scheduled appointment time.   If you have a lab appointment with the [facility] please come in thru the  Main Entrance and check in at the main information desk  You need to re-schedule your appointment should you arrive 10 or more minutes late.  We strive to give you quality time with our providers, and arriving late affects you and other patients whose appointments are after yours.  Also, if you no show three or more times for appointments you may be dismissed from the clinic at the providers discretion.     Again, thank you for choosing [facility].  Our hope is that these requests will decrease the amount of time that you wait before being seen by our physicians.       _____________________________________________________________  Should you have questions after your visit to [facility], please contact our office at [phone] between the hours of 8:00 a.m. and 4:30 p.m.  Voicemails left after 4:00 p.m. will not be returned until the following business day.  For prescription refill requests, have your pharmacy contact our office and allow 72 hours.    Cancer Center Support Programs:   > Cancer Support Group  2nd Tuesday of the month 1pm-2pm, Journey Room
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
## 2018-05-28 ENCOUNTER — Encounter (HOSPITAL_COMMUNITY)
Admission: RE | Admit: 2018-05-28 | Discharge: 2018-05-28 | Disposition: A | Discharge status: Home / Self Care | Payer: PPO | Source: Ambulatory Visit | Attending: Nurse Practitioner | Admitting: Nurse Practitioner

## 2018-05-28 DIAGNOSIS — R599 Enlarged lymph nodes, unspecified: Secondary | ICD-10-CM | POA: Diagnosis not present

## 2018-05-28 DIAGNOSIS — R591 Generalized enlarged lymph nodes: Secondary | ICD-10-CM | POA: Diagnosis not present

## 2018-05-28 MED ORDER — FLUDEOXYGLUCOSE F - 18 (FDG) INJECTION
12.4600 | Freq: Once | INTRAVENOUS | Status: AC | PRN
  Administered 2018-05-28: 12.46 via INTRAVENOUS

## 2018-05-30 ENCOUNTER — Encounter (HOSPITAL_COMMUNITY): Payer: Self-pay | Admitting: Hematology

## 2018-05-30 ENCOUNTER — Inpatient Hospital Stay (HOSPITAL_BASED_OUTPATIENT_CLINIC_OR_DEPARTMENT_OTHER): Payer: PPO | Admitting: Hematology

## 2018-05-30 VITALS — BP 173/87 | HR 67 | Temp 97.9°F | Resp 18 | Wt 188.5 lb

## 2018-05-30 DIAGNOSIS — Z79899 Other long term (current) drug therapy: Secondary | ICD-10-CM

## 2018-05-30 DIAGNOSIS — C8593 Non-Hodgkin lymphoma, unspecified, intra-abdominal lymph nodes: Secondary | ICD-10-CM

## 2018-05-30 DIAGNOSIS — Z9079 Acquired absence of other genital organ(s): Secondary | ICD-10-CM

## 2018-05-30 DIAGNOSIS — Z8042 Family history of malignant neoplasm of prostate: Secondary | ICD-10-CM

## 2018-05-30 DIAGNOSIS — N400 Enlarged prostate without lower urinary tract symptoms: Secondary | ICD-10-CM

## 2018-05-30 DIAGNOSIS — I700 Atherosclerosis of aorta: Secondary | ICD-10-CM | POA: Diagnosis not present

## 2018-05-30 DIAGNOSIS — I10 Essential (primary) hypertension: Secondary | ICD-10-CM | POA: Diagnosis not present

## 2018-05-30 DIAGNOSIS — R591 Generalized enlarged lymph nodes: Secondary | ICD-10-CM

## 2018-05-30 DIAGNOSIS — Z7982 Long term (current) use of aspirin: Secondary | ICD-10-CM

## 2018-05-30 DIAGNOSIS — Z87891 Personal history of nicotine dependence: Secondary | ICD-10-CM | POA: Diagnosis not present

## 2018-05-30 DIAGNOSIS — Z801 Family history of malignant neoplasm of trachea, bronchus and lung: Secondary | ICD-10-CM

## 2018-05-30 NOTE — Patient Instructions (Addendum)
[facility] at [facility] Discharge Instructions  Follow up in 6 months with labs    Thank you for choosing [facility] at [facility] to provide your oncology and hematology care.  To afford each patient quality time with our provider, please arrive at least 15 minutes before your scheduled appointment time.   If you have a lab appointment with the Cancer Center please come in thru the  Main Entrance and check in at the main information desk  You need to re-schedule your appointment should you arrive 10 or more minutes late.  We strive to give you quality time with our providers, and arriving late affects you and other patients whose appointments are after yours.  Also, if you no show three or more times for appointments you may be dismissed from the clinic at the providers discretion.     Again, thank you for choosing [facility].  Our hope is that these requests will decrease the amount of time that you wait before being seen by our physicians.       _____________________________________________________________  Should you have questions after your visit to [facility], please contact our office at [phone] between the hours of 8:00 a.m. and 4:30 p.m.  Voicemails left after 4:00 p.m. will not be returned until the following business day.  For prescription refill requests, have your pharmacy contact our office and allow 72 hours.    Cancer Center Support Programs:   > Cancer Support Group  2nd Tuesday of the month 1pm-2pm, Journey Room    

## 2018-05-30 NOTE — Assessment & Plan Note (Addendum)
1.  Low-grade lymphoma: - Recent CT for hematuria work-up on 04/05/2018 showed several retroperitoneal lymph nodes measuring 1.2 cm and pelvic lymph nodes bilaterally, largest in the left obturator region measuring 2.2 cm. -Patient does not have any B symptoms including fevers, night sweats or weight loss. - I have reviewed his CT scan from 2010 done at [facility] which also showed lymphadenopathy and splenomegaly. - There is no clinically palpable adenopathy. - PET/CT scan on 05/28/2018 showed very low metabolic activity associated with enlarged pelvic and retroperitoneal and axillary nodes.  Spleen and bone marrow are normal.  We discussed the normal progression of low-grade lymphomas in detail. - His LDH and other blood counts were within normal limits.  There is no indication for biopsy at this time. -We will consider biopsy if he develops B symptoms or cytopenias or recurrent infections. -I will see him back in 6 months for follow-up.  We will do physical exam and blood work at that time.  We will do repeat scans only if clinical condition dictates.

## 2018-05-30 NOTE — Progress Notes (Signed)
[address] [place], [place] [address]   CLINIC:  Medical Oncology/Hematology  PCP:  Monico Blitz, [place] [address] [phone]   REASON FOR VISIT: Follow-up for generalized lymphadenopathy  CURRENT THERAPY: Observation   INTERVAL HISTORY:  Kevin Hooper 72 y.o. male returns for routine follow-up for generlized lymphadenopathy. Patient is here today with his wife. He is doing well and has no complaints. He is full fuctioning lives at home with his wife. He performs all his own ADLs and activities. He remains active at home. He reports his appetite at 100% and has no problem maintaining his weight. His energy level is 75%. He denies any B symptoms. He denies any new pain. Denies any recent infections or fevers.     REVIEW OF SYSTEMS:  Review of Systems  All other systems reviewed and are negative.    PAST MEDICAL/SURGICAL HISTORY:  Past Medical History:  Diagnosis Date  . Arthritis   . Cataracts, bilateral 11/2016  . Hyperlipidemia   . Hypertension   . Joint ache   . Prostate atrophy 2018   Past Surgical History:  Procedure Laterality Date  . COLON RESECTION  2015  . HERNIA REPAIR    . LUMBAR FUSION  1990  . OTHER SURGICAL HISTORY Left    Arm s/p accident  . REPLACEMENT TOTAL KNEE BILATERAL Bilateral 1990  . TRANSURETHRAL RESECTION OF PROSTATE N/A 07/04/2017   Procedure: TRANSURETHRAL RESECTION OF THE PROSTATE (TURP);  Surgeon: Irine Seal, MD;  Location: [facility];  Service: Urology;  Laterality: N/A;     SOCIAL HISTORY:  Social History   Socioeconomic History  . Marital status: Married    Spouse name: Not on file  . Number of children: 2  . Years of education: 12+  . Highest education level: Not on file  Occupational History  . Occupation: Retired  Scientific laboratory technician  . Financial resource strain: Not on file  . Food insecurity:    Worry: Not on file    Inability: Not on file  . Transportation needs:    Medical:  Not on file    Non-medical: Not on file  Tobacco Use  . Smoking status: Former Smoker    Last attempt to quit: 04/28/1992    Years since quitting: 26.1  . Smokeless tobacco: Never Used  Substance and Sexual Activity  . Alcohol use: Yes    Comment: Occasional  . Drug use: No  . Sexual activity: Not on file    Comment: Married  Lifestyle  . Physical activity:    Days per week: Not on file    Minutes per session: Not on file  . Stress: Not on file  Relationships  . Social connections:    Talks on phone: Not on file    Gets together: Not on file    Attends religious service: Not on file    Active member of club or organization: Not on file    Attends meetings of clubs or organizations: Not on file    Relationship status: Not on file  . Intimate partner violence:    Fear of current or ex partner: Not on file    Emotionally abused: Not on file    Physically abused: Not on file    Forced sexual activity: Not on file  Other Topics Concern  . Not on file  Social History Narrative   Lives at home w/ his wife   Right-handed   Caffeine: occasional tea  FAMILY HISTORY:  Family History  Problem Relation Age of Onset  . COPD Father   . Throat cancer Father   . Cancer Brother     CURRENT MEDICATIONS:  Outpatient Encounter Medications as of 05/30/2018  Medication Sig Note  . naproxen sodium (ALEVE) 220 MG tablet Take 220 mg by mouth daily as needed.   Marland Kitchen acetaminophen (TYLENOL) 500 MG tablet Take 500 mg by mouth every 8 (eight) hours as needed for mild pain or headache.   . albuterol (PROVENTIL HFA;VENTOLIN HFA) 108 (90 Base) MCG/ACT inhaler Inhale 2 puffs into the lungs every 6 (six) hours as needed for wheezing or shortness of breath.    Marland Kitchen aspirin EC 81 MG tablet Take 81 mg by mouth daily.   . diclofenac (VOLTAREN) 75 MG EC tablet Take 75 mg by mouth 2 (two) times daily.   Marland Kitchen lisinopril (PRINIVIL,ZESTRIL) 10 MG tablet Take 10 mg by mouth every evening.  04/28/2016: Received  from: External Pharmacy Received Sig: TAKE ONE TABLET BY MOUTH DAILY.  . metoprolol succinate (TOPROL-XL) 50 MG 24 hr tablet TAKE 1 TABLET BY MOUTH TWICE DAILY   . Multiple Vitamin (MULTIVITAMIN WITH MINERALS) TABS tablet Take 1 tablet by mouth daily.    No facility-administered encounter medications on file as of 05/30/2018.     ALLERGIES:  No Known Allergies   PHYSICAL EXAM:  ECOG Performance status: 1  Vitals:   05/30/18 0800  BP: (!) 173/87  Pulse: 67  Resp: 18  Temp: 97.9 F (36.6 C)  SpO2: 100%   Filed Weights   05/30/18 0800  Weight: 188 lb 8 oz (85.5 kg)    Physical Exam  Constitutional: He is oriented to person, place, and time. He appears well-developed and well-nourished.  Musculoskeletal: Normal range of motion.  Neurological: He is alert and oriented to person, place, and time.  Skin: Skin is warm and dry.  Psychiatric: He has a normal mood and affect. His behavior is normal. Judgment and thought content normal.  No palpable adenopathy.  No palpable splenomegaly.   LABORATORY DATA:  I have reviewed the labs as listed.  CBC    Component Value Date/Time   WBC 8.4 05/04/2018 1343   RBC 4.36 05/04/2018 1343   HGB 13.5 05/04/2018 1343   HCT 38.6 (L) 05/04/2018 1343   PLT 208 05/04/2018 1343   MCV 88.5 05/04/2018 1343   MCH 31.0 05/04/2018 1343   MCHC 35.0 05/04/2018 1343   RDW 12.7 05/04/2018 1343   LYMPHSABS 1.8 05/04/2018 1343   MONOABS 0.7 05/04/2018 1343   EOSABS 0.5 05/04/2018 1343   BASOSABS 0.1 05/04/2018 1343   CMP Latest Ref Rng & Units 05/04/2018 04/05/2018 07/06/2017  Glucose 70 - 99 mg/dL 83 - 139(H)  BUN 8 - 23 mg/dL 35(H) - 29(H)  Creatinine 0.61 - 1.24 mg/dL 1.53(H) 1.30(H) 1.28(H)  Sodium 135 - 145 mmol/L 141 - 136  Potassium 3.5 - 5.1 mmol/L 4.3 - 4.9  Chloride 98 - 111 mmol/L 110 - 105  CO2 22 - 32 mmol/L 24 - 24  Calcium 8.9 - 10.3 mg/dL 8.8(L) - 8.4(L)  Total Protein 6.5 - 8.1 g/dL 7.4 - -  Total Bilirubin 0.3 - 1.2 mg/dL 0.8 -  -  Alkaline Phos 38 - 126 U/L 61 - -  AST 15 - 41 U/L 17 - -  ALT 0 - 44 U/L 23 - -       DIAGNOSTIC IMAGING:  I have independently reviewed images of the PET scan  dated 05/28/2018 and discussed with the patient.     ASSESSMENT & PLAN:   Lymphadenopathy 1.  Low-grade lymphoma: - Recent CT for hematuria work-up on 04/05/2018 showed several retroperitoneal lymph nodes measuring 1.2 cm and pelvic lymph nodes bilaterally, largest in the left obturator region measuring 2.2 cm. -Patient does not have any B symptoms including fevers, night sweats or weight loss. - I have reviewed his CT scan from 2010 done at [facility] which also showed lymphadenopathy and splenomegaly. - There is no clinically palpable adenopathy. - PET/CT scan on 05/28/2018 showed very low metabolic activity associated with enlarged pelvic and retroperitoneal and axillary nodes.  Spleen and bone marrow are normal.  We discussed the normal progression of low-grade lymphomas in detail. - His LDH and other blood counts were within normal limits.  There is no indication for biopsy at this time. -We will consider biopsy if he develops B symptoms or cytopenias or recurrent infections. -I will see him back in 6 months for follow-up.  We will do physical exam and blood work at that time.  We will do repeat scans only if clinical condition dictates.      Orders placed this encounter:  Orders Placed This Encounter  Procedures  . Lactate dehydrogenase  . CBC with Differential/Platelet  . Comprehensive metabolic panel      Derek Jack, MD [place] [phone]

## 2018-06-15 DIAGNOSIS — Z6830 Body mass index (BMI) 30.0-30.9, adult: Secondary | ICD-10-CM | POA: Diagnosis not present

## 2018-06-15 DIAGNOSIS — I10 Essential (primary) hypertension: Secondary | ICD-10-CM | POA: Diagnosis not present

## 2018-06-15 DIAGNOSIS — M1990 Unspecified osteoarthritis, unspecified site: Secondary | ICD-10-CM | POA: Diagnosis not present

## 2018-06-15 DIAGNOSIS — Z299 Encounter for prophylactic measures, unspecified: Secondary | ICD-10-CM | POA: Diagnosis not present

## 2018-06-15 DIAGNOSIS — J439 Emphysema, unspecified: Secondary | ICD-10-CM | POA: Diagnosis not present

## 2018-06-15 DIAGNOSIS — E1165 Type 2 diabetes mellitus with hyperglycemia: Secondary | ICD-10-CM | POA: Diagnosis not present

## 2018-07-02 DIAGNOSIS — R972 Elevated prostate specific antigen [PSA]: Secondary | ICD-10-CM | POA: Diagnosis not present

## 2018-07-06 ENCOUNTER — Ambulatory Visit (INDEPENDENT_AMBULATORY_CARE_PROVIDER_SITE_OTHER): Payer: PPO | Admitting: Urology

## 2018-07-06 DIAGNOSIS — R351 Nocturia: Secondary | ICD-10-CM | POA: Diagnosis not present

## 2018-07-06 DIAGNOSIS — E291 Testicular hypofunction: Secondary | ICD-10-CM | POA: Diagnosis not present

## 2018-07-06 DIAGNOSIS — N401 Enlarged prostate with lower urinary tract symptoms: Secondary | ICD-10-CM | POA: Diagnosis not present

## 2018-07-06 DIAGNOSIS — R972 Elevated prostate specific antigen [PSA]: Secondary | ICD-10-CM | POA: Diagnosis not present

## 2018-07-06 DIAGNOSIS — N5201 Erectile dysfunction due to arterial insufficiency: Secondary | ICD-10-CM

## 2018-09-03 DIAGNOSIS — I10 Essential (primary) hypertension: Secondary | ICD-10-CM | POA: Diagnosis not present

## 2018-09-03 DIAGNOSIS — J439 Emphysema, unspecified: Secondary | ICD-10-CM | POA: Diagnosis not present

## 2018-09-03 DIAGNOSIS — F329 Major depressive disorder, single episode, unspecified: Secondary | ICD-10-CM | POA: Diagnosis not present

## 2018-09-03 DIAGNOSIS — Z299 Encounter for prophylactic measures, unspecified: Secondary | ICD-10-CM | POA: Diagnosis not present

## 2018-09-03 DIAGNOSIS — Z6830 Body mass index (BMI) 30.0-30.9, adult: Secondary | ICD-10-CM | POA: Diagnosis not present

## 2018-09-05 DIAGNOSIS — D126 Benign neoplasm of colon, unspecified: Secondary | ICD-10-CM | POA: Diagnosis not present

## 2018-09-19 DIAGNOSIS — E1165 Type 2 diabetes mellitus with hyperglycemia: Secondary | ICD-10-CM | POA: Diagnosis not present

## 2018-09-19 DIAGNOSIS — M0520 Rheumatoid vasculitis with rheumatoid arthritis of unspecified site: Secondary | ICD-10-CM | POA: Diagnosis not present

## 2018-09-19 DIAGNOSIS — I10 Essential (primary) hypertension: Secondary | ICD-10-CM | POA: Diagnosis not present

## 2018-09-19 DIAGNOSIS — Z299 Encounter for prophylactic measures, unspecified: Secondary | ICD-10-CM | POA: Diagnosis not present

## 2018-09-19 DIAGNOSIS — Z6830 Body mass index (BMI) 30.0-30.9, adult: Secondary | ICD-10-CM | POA: Diagnosis not present

## 2018-09-19 DIAGNOSIS — J449 Chronic obstructive pulmonary disease, unspecified: Secondary | ICD-10-CM | POA: Diagnosis not present

## 2018-09-19 DIAGNOSIS — Z87891 Personal history of nicotine dependence: Secondary | ICD-10-CM | POA: Diagnosis not present

## 2018-09-20 DIAGNOSIS — Z86010 Personal history of colonic polyps: Secondary | ICD-10-CM | POA: Diagnosis not present

## 2018-09-20 DIAGNOSIS — Z96653 Presence of artificial knee joint, bilateral: Secondary | ICD-10-CM | POA: Diagnosis not present

## 2018-09-20 DIAGNOSIS — Z1211 Encounter for screening for malignant neoplasm of colon: Secondary | ICD-10-CM | POA: Diagnosis not present

## 2018-09-20 DIAGNOSIS — Z09 Encounter for follow-up examination after completed treatment for conditions other than malignant neoplasm: Secondary | ICD-10-CM | POA: Diagnosis not present

## 2018-09-20 DIAGNOSIS — I10 Essential (primary) hypertension: Secondary | ICD-10-CM | POA: Diagnosis not present

## 2018-09-20 DIAGNOSIS — K640 First degree hemorrhoids: Secondary | ICD-10-CM | POA: Diagnosis not present

## 2018-09-20 DIAGNOSIS — Z87442 Personal history of urinary calculi: Secondary | ICD-10-CM | POA: Diagnosis not present

## 2018-09-20 DIAGNOSIS — M1990 Unspecified osteoarthritis, unspecified site: Secondary | ICD-10-CM | POA: Diagnosis not present

## 2018-09-20 DIAGNOSIS — E119 Type 2 diabetes mellitus without complications: Secondary | ICD-10-CM | POA: Diagnosis not present

## 2018-09-20 DIAGNOSIS — Z7982 Long term (current) use of aspirin: Secondary | ICD-10-CM | POA: Diagnosis not present

## 2018-09-20 DIAGNOSIS — Z9049 Acquired absence of other specified parts of digestive tract: Secondary | ICD-10-CM | POA: Diagnosis not present

## 2018-09-20 DIAGNOSIS — J449 Chronic obstructive pulmonary disease, unspecified: Secondary | ICD-10-CM | POA: Diagnosis not present

## 2018-09-20 DIAGNOSIS — Z87891 Personal history of nicotine dependence: Secondary | ICD-10-CM | POA: Diagnosis not present

## 2018-09-20 DIAGNOSIS — E785 Hyperlipidemia, unspecified: Secondary | ICD-10-CM | POA: Diagnosis not present

## 2018-10-03 DIAGNOSIS — E1165 Type 2 diabetes mellitus with hyperglycemia: Secondary | ICD-10-CM | POA: Diagnosis not present

## 2018-10-03 DIAGNOSIS — Z6830 Body mass index (BMI) 30.0-30.9, adult: Secondary | ICD-10-CM | POA: Diagnosis not present

## 2018-10-03 DIAGNOSIS — Z299 Encounter for prophylactic measures, unspecified: Secondary | ICD-10-CM | POA: Diagnosis not present

## 2018-10-03 DIAGNOSIS — F419 Anxiety disorder, unspecified: Secondary | ICD-10-CM | POA: Diagnosis not present

## 2018-10-03 DIAGNOSIS — I10 Essential (primary) hypertension: Secondary | ICD-10-CM | POA: Diagnosis not present

## 2018-10-10 DIAGNOSIS — J449 Chronic obstructive pulmonary disease, unspecified: Secondary | ICD-10-CM | POA: Diagnosis not present

## 2018-10-26 DIAGNOSIS — Z7189 Other specified counseling: Secondary | ICD-10-CM | POA: Diagnosis not present

## 2018-10-26 DIAGNOSIS — Z79899 Other long term (current) drug therapy: Secondary | ICD-10-CM | POA: Diagnosis not present

## 2018-10-26 DIAGNOSIS — I10 Essential (primary) hypertension: Secondary | ICD-10-CM | POA: Diagnosis not present

## 2018-10-26 DIAGNOSIS — Z1211 Encounter for screening for malignant neoplasm of colon: Secondary | ICD-10-CM | POA: Diagnosis not present

## 2018-10-26 DIAGNOSIS — Z0000 Encounter for general adult medical examination without abnormal findings: Secondary | ICD-10-CM | POA: Diagnosis not present

## 2018-10-26 DIAGNOSIS — Z1339 Encounter for screening examination for other mental health and behavioral disorders: Secondary | ICD-10-CM | POA: Diagnosis not present

## 2018-10-26 DIAGNOSIS — Z1331 Encounter for screening for depression: Secondary | ICD-10-CM | POA: Diagnosis not present

## 2018-10-26 DIAGNOSIS — R5383 Other fatigue: Secondary | ICD-10-CM | POA: Diagnosis not present

## 2018-10-26 DIAGNOSIS — Z125 Encounter for screening for malignant neoplasm of prostate: Secondary | ICD-10-CM | POA: Diagnosis not present

## 2018-10-26 DIAGNOSIS — Z299 Encounter for prophylactic measures, unspecified: Secondary | ICD-10-CM | POA: Diagnosis not present

## 2018-10-26 DIAGNOSIS — Z6830 Body mass index (BMI) 30.0-30.9, adult: Secondary | ICD-10-CM | POA: Diagnosis not present

## 2018-10-26 DIAGNOSIS — E7800 Pure hypercholesterolemia, unspecified: Secondary | ICD-10-CM | POA: Diagnosis not present

## 2018-11-22 ENCOUNTER — Other Ambulatory Visit (HOSPITAL_COMMUNITY): Payer: PPO

## 2018-11-29 ENCOUNTER — Ambulatory Visit (HOSPITAL_COMMUNITY): Payer: PPO | Admitting: Hematology

## 2018-12-10 ENCOUNTER — Inpatient Hospital Stay (HOSPITAL_COMMUNITY): Payer: PPO | Attending: Hematology

## 2018-12-10 ENCOUNTER — Other Ambulatory Visit: Payer: Self-pay

## 2018-12-10 DIAGNOSIS — Z87891 Personal history of nicotine dependence: Secondary | ICD-10-CM | POA: Diagnosis not present

## 2018-12-10 DIAGNOSIS — Z808 Family history of malignant neoplasm of other organs or systems: Secondary | ICD-10-CM | POA: Insufficient documentation

## 2018-12-10 DIAGNOSIS — R591 Generalized enlarged lymph nodes: Secondary | ICD-10-CM | POA: Insufficient documentation

## 2018-12-10 DIAGNOSIS — Z809 Family history of malignant neoplasm, unspecified: Secondary | ICD-10-CM | POA: Diagnosis not present

## 2018-12-10 LAB — CBC WITH DIFFERENTIAL/PLATELET
Abs Immature Granulocytes: 0.04 10*3/uL (ref 0.00–0.07)
Basophils Absolute: 0.1 10*3/uL (ref 0.0–0.1)
Basophils Relative: 1 %
Eosinophils Absolute: 0.6 10*3/uL — ABNORMAL HIGH (ref 0.0–0.5)
Eosinophils Relative: 8 %
HCT: 37.9 % — ABNORMAL LOW (ref 39.0–52.0)
Hemoglobin: 13.2 g/dL (ref 13.0–17.0)
Immature Granulocytes: 1 %
Lymphocytes Relative: 24 %
Lymphs Abs: 1.9 10*3/uL (ref 0.7–4.0)
MCH: 30.6 pg (ref 26.0–34.0)
MCHC: 34.8 g/dL (ref 30.0–36.0)
MCV: 87.7 fL (ref 80.0–100.0)
Monocytes Absolute: 0.7 10*3/uL (ref 0.1–1.0)
Monocytes Relative: 9 %
Neutro Abs: 4.4 10*3/uL (ref 1.7–7.7)
Neutrophils Relative %: 57 %
Platelets: 193 10*3/uL (ref 150–400)
RBC: 4.32 MIL/uL (ref 4.22–5.81)
RDW: 13.1 % (ref 11.5–15.5)
WBC: 7.8 10*3/uL (ref 4.0–10.5)
nRBC: 0 % (ref 0.0–0.2)

## 2018-12-10 LAB — COMPREHENSIVE METABOLIC PANEL
ALT: 20 U/L (ref 0–44)
AST: 15 U/L (ref 15–41)
Albumin: 3.6 g/dL (ref 3.5–5.0)
Alkaline Phosphatase: 63 U/L (ref 38–126)
Anion gap: 7 (ref 5–15)
BUN: 23 mg/dL (ref 8–23)
CO2: 26 mmol/L (ref 22–32)
Calcium: 9.2 mg/dL (ref 8.9–10.3)
Chloride: 107 mmol/L (ref 98–111)
Creatinine, Ser: 1.15 mg/dL (ref 0.61–1.24)
GFR calc Af Amer: >60 mL/min (ref >60)
GFR calc non Af Amer: >60 mL/min (ref >60)
Glucose, Bld: 137 mg/dL — ABNORMAL HIGH (ref 70–99)
Potassium: 4.2 mmol/L (ref 3.5–5.1)
Sodium: 140 mmol/L (ref 135–145)
Total Bilirubin: 0.6 mg/dL (ref 0.3–1.2)
Total Protein: 7.2 g/dL (ref 6.5–8.1)

## 2018-12-10 LAB — LACTATE DEHYDROGENASE: LDH: 143 U/L (ref 98–192)

## 2018-12-14 ENCOUNTER — Other Ambulatory Visit: Payer: Self-pay

## 2018-12-14 ENCOUNTER — Encounter (HOSPITAL_COMMUNITY): Payer: Self-pay | Admitting: Hematology

## 2018-12-14 ENCOUNTER — Inpatient Hospital Stay (HOSPITAL_BASED_OUTPATIENT_CLINIC_OR_DEPARTMENT_OTHER): Payer: PPO | Admitting: Hematology

## 2018-12-14 VITALS — BP 125/76 | HR 69 | Temp 98.0°F | Resp 18 | Wt 185.6 lb

## 2018-12-14 DIAGNOSIS — Z809 Family history of malignant neoplasm, unspecified: Secondary | ICD-10-CM | POA: Diagnosis not present

## 2018-12-14 DIAGNOSIS — Z808 Family history of malignant neoplasm of other organs or systems: Secondary | ICD-10-CM | POA: Diagnosis not present

## 2018-12-14 DIAGNOSIS — R591 Generalized enlarged lymph nodes: Secondary | ICD-10-CM | POA: Diagnosis not present

## 2018-12-14 DIAGNOSIS — Z87891 Personal history of nicotine dependence: Secondary | ICD-10-CM | POA: Diagnosis not present

## 2018-12-14 NOTE — Assessment & Plan Note (Signed)
1.  Highly likely low-grade lymphoma: - Recent CT for hematuria work-up on 04/05/2018 showed several retroperitoneal lymph nodes measuring 1.2 cm and pelvic lymph nodes bilaterally, largest in the left obturator region measuring 2.2 cm. -Patient does not have any B symptoms including fevers, night sweats or weight loss. - CT scan from 2010 done at [facility] also showed lymphadenopathy and splenomegaly.  - PET/CT scan on 05/28/2018 showed very low metabolic activity associated with enlarged pelvic and retroperitoneal and axillary nodes.  Spleen and bone marrow are normal.  We discussed the normal progression of low-grade lymphomas in detail. - Physical exam today did not reveal any lymphadenopathy.  He denied any fevers, night sweats or weight loss.  No recurrent infections or hospitalizations. -We will consider biopsy of the lymph nodes if he develops any B symptoms or cytopenias or recurrent infections. -We reviewed his blood work which is grossly unchanged.  His kidney function has improved. -He will come back in 6 months for follow-up with repeat labs and physical exam.

## 2018-12-14 NOTE — Patient Instructions (Signed)
[facility] at [facility] Discharge Instructions  You were seen today by Dr. Katragadda. He went over your recent lab results. He will see you back in 6 months for labs and follow up.   Thank you for choosing [facility] at [facility] to provide your oncology and hematology care.  To afford each patient quality time with our provider, please arrive at least 15 minutes before your scheduled appointment time.   If you have a lab appointment with the Cancer Center please come in thru the  Main Entrance and check in at the main information desk  You need to re-schedule your appointment should you arrive 10 or more minutes late.  We strive to give you quality time with our providers, and arriving late affects you and other patients whose appointments are after yours.  Also, if you no show three or more times for appointments you may be dismissed from the clinic at the providers discretion.     Again, thank you for choosing [facility].  Our hope is that these requests will decrease the amount of time that you wait before being seen by our physicians.       _____________________________________________________________  Should you have questions after your visit to [facility], please contact our office at [phone] between the hours of 8:00 a.m. and 4:30 p.m.  Voicemails left after 4:00 p.m. will not be returned until the following business day.  For prescription refill requests, have your pharmacy contact our office and allow 72 hours.    Cancer Center Support Programs:   > Cancer Support Group  2nd Tuesday of the month 1pm-2pm, Journey Room    

## 2018-12-14 NOTE — Progress Notes (Signed)
[place], [place] [address]   CLINIC:  Medical Oncology/Hematology  PCP:  Monico Blitz, [place] [address] [phone]   REASON FOR VISIT:  Follow-up for generalized lymphadenopathy  CURRENT THERAPY: Observation     INTERVAL HISTORY:  Kevin Hooper 73 y.o. male returns for routine follow-up. He is here today alone. He states that he has been doing well since his last visit. Denies any nausea, vomiting, or diarrhea. Denies any new pains. Had not noticed any recent bleeding such as epistaxis, hematuria or hematochezia. Denies recent chest pain on exertion, shortness of breath on minimal exertion, pre-syncopal episodes, or palpitations. Denies any numbness or tingling in hands or feet. Denies any recent fevers, infections, or recent hospitalizations. Patient reports appetite at 100% and energy level at 50%.  REVIEW OF SYSTEMS:  Review of Systems  All other systems reviewed and are negative.    PAST MEDICAL/SURGICAL HISTORY:  Past Medical History:  Diagnosis Date  . Arthritis   . Cataracts, bilateral 11/2016  . Hyperlipidemia   . Hypertension   . Joint ache   . Prostate atrophy 2018   Past Surgical History:  Procedure Laterality Date  . COLON RESECTION  2015  . HERNIA REPAIR    . LUMBAR FUSION  1990  . OTHER SURGICAL HISTORY Left    Arm s/p accident  . REPLACEMENT TOTAL KNEE BILATERAL Bilateral 1990  . TRANSURETHRAL RESECTION OF PROSTATE N/A 07/04/2017   Procedure: TRANSURETHRAL RESECTION OF THE PROSTATE (TURP);  Surgeon: Irine Seal, MD;  Location: [facility];  Service: Urology;  Laterality: N/A;     SOCIAL HISTORY:  Social History   Socioeconomic History  . Marital status: Married    Spouse name: Not on file  . Number of children: 2  . Years of education: 12+  . Highest education level: Not on file  Occupational History  . Occupation: Retired  Scientific laboratory technician  . Financial resource strain: Not on file  .  Food insecurity:    Worry: Not on file    Inability: Not on file  . Transportation needs:    Medical: Not on file    Non-medical: Not on file  Tobacco Use  . Smoking status: Former Smoker    Last attempt to quit: 04/28/1992    Years since quitting: 26.6  . Smokeless tobacco: Never Used  Substance and Sexual Activity  . Alcohol use: Yes    Comment: Occasional  . Drug use: No  . Sexual activity: Not on file    Comment: Married  Lifestyle  . Physical activity:    Days per week: Not on file    Minutes per session: Not on file  . Stress: Not on file  Relationships  . Social connections:    Talks on phone: Not on file    Gets together: Not on file    Attends religious service: Not on file    Active member of club or organization: Not on file    Attends meetings of clubs or organizations: Not on file    Relationship status: Not on file  . Intimate partner violence:    Fear of current or ex partner: Not on file    Emotionally abused: Not on file    Physically abused: Not on file    Forced sexual activity: Not on file  Other Topics Concern  . Not on file  Social History Narrative   Lives at home w/ his wife  Right-handed   Caffeine: occasional tea    FAMILY HISTORY:  Family History  Problem Relation Age of Onset  . COPD Father   . Throat cancer Father   . Cancer Brother     CURRENT MEDICATIONS:  Outpatient Encounter Medications as of 12/14/2018  Medication Sig Note  . acetaminophen (TYLENOL) 500 MG tablet Take 500 mg by mouth every 8 (eight) hours as needed for mild pain or headache.   . albuterol (PROVENTIL HFA;VENTOLIN HFA) 108 (90 Base) MCG/ACT inhaler Inhale 2 puffs into the lungs every 6 (six) hours as needed for wheezing or shortness of breath.    Marland Kitchen aspirin EC 81 MG tablet Take 81 mg by mouth daily.   Marland Kitchen lisinopril (PRINIVIL,ZESTRIL) 10 MG tablet Take 10 mg by mouth every evening.  04/28/2016: Received from: External Pharmacy Received Sig: TAKE ONE TABLET BY MOUTH  DAILY.  . metoprolol succinate (TOPROL-XL) 50 MG 24 hr tablet TAKE 1 TABLET BY MOUTH TWICE DAILY   . Multiple Vitamin (MULTIVITAMIN WITH MINERALS) TABS tablet Take 1 tablet by mouth daily.   . naproxen sodium (ALEVE) 220 MG tablet Take 220 mg by mouth daily as needed.   . diclofenac (VOLTAREN) 75 MG EC tablet Take 75 mg by mouth 2 (two) times daily.    No facility-administered encounter medications on file as of 12/14/2018.     ALLERGIES:  No Known Allergies   PHYSICAL EXAM:  ECOG Performance status: 1  Vitals:   12/14/18 1005  BP: 125/76  Pulse: 69  Resp: 18  Temp: 98 F (36.7 C)  SpO2: 96%   Filed Weights   12/14/18 1005  Weight: 185 lb 9.6 oz (84.2 kg)    Physical Exam Vitals signs reviewed.  Constitutional:      Appearance: Normal appearance.  Cardiovascular:     Rate and Rhythm: Normal rate and regular rhythm.     Heart sounds: Normal heart sounds.  Pulmonary:     Breath sounds: Normal breath sounds.  Abdominal:     General: There is no distension.     Palpations: Abdomen is soft. There is no mass.  Musculoskeletal:        General: No swelling.  Lymphadenopathy:     Cervical: No cervical adenopathy.  Skin:    General: Skin is warm.  Neurological:     General: No focal deficit present.     Mental Status: He is alert and oriented to person, place, and time.  Psychiatric:        Mood and Affect: Mood normal.        Behavior: Behavior normal.      LABORATORY DATA:  I have reviewed the labs as listed.  CBC    Component Value Date/Time   WBC 7.8 12/10/2018 1046   RBC 4.32 12/10/2018 1046   HGB 13.2 12/10/2018 1046   HCT 37.9 (L) 12/10/2018 1046   PLT 193 12/10/2018 1046   MCV 87.7 12/10/2018 1046   MCH 30.6 12/10/2018 1046   MCHC 34.8 12/10/2018 1046   RDW 13.1 12/10/2018 1046   LYMPHSABS 1.9 12/10/2018 1046   MONOABS 0.7 12/10/2018 1046   EOSABS 0.6 (H) 12/10/2018 1046   BASOSABS 0.1 12/10/2018 1046   CMP Latest Ref Rng & Units 12/10/2018  05/04/2018 04/05/2018  Glucose 70 - 99 mg/dL 137(H) 83 -  BUN 8 - 23 mg/dL 23 35(H) -  Creatinine 0.61 - 1.24 mg/dL 1.15 1.53(H) 1.30(H)  Sodium 135 - 145 mmol/L 140 141 -  Potassium 3.5 -  5.1 mmol/L 4.2 4.3 -  Chloride 98 - 111 mmol/L 107 110 -  CO2 22 - 32 mmol/L 26 24 -  Calcium 8.9 - 10.3 mg/dL 9.2 8.8(L) -  Total Protein 6.5 - 8.1 g/dL 7.2 7.4 -  Total Bilirubin 0.3 - 1.2 mg/dL 0.6 0.8 -  Alkaline Phos 38 - 126 U/L 63 61 -  AST 15 - 41 U/L 15 17 -  ALT 0 - 44 U/L 20 23 -       DIAGNOSTIC IMAGING:  I have independently reviewed the scans and discussed with the patient.   I have reviewed Venita Lick LPN's note and agree with the documentation.  I personally performed a face-to-face visit, made revisions and my assessment and plan is as follows.    ASSESSMENT & PLAN:   Lymphadenopathy 1.  Highly likely low-grade lymphoma: - Recent CT for hematuria work-up on 04/05/2018 showed several retroperitoneal lymph nodes measuring 1.2 cm and pelvic lymph nodes bilaterally, largest in the left obturator region measuring 2.2 cm. -Patient does not have any B symptoms including fevers, night sweats or weight loss. - CT scan from 2010 done at [facility] also showed lymphadenopathy and splenomegaly.  - PET/CT scan on 05/28/2018 showed very low metabolic activity associated with enlarged pelvic and retroperitoneal and axillary nodes.  Spleen and bone marrow are normal.  We discussed the normal progression of low-grade lymphomas in detail. - Physical exam today did not reveal any lymphadenopathy.  He denied any fevers, night sweats or weight loss.  No recurrent infections or hospitalizations. -We will consider biopsy of the lymph nodes if he develops any B symptoms or cytopenias or recurrent infections. -We reviewed his blood work which is grossly unchanged.  His kidney function has improved. -He will come back in 6 months for follow-up with repeat labs and physical exam.        Orders placed this encounter:  Orders Placed This Encounter  Procedures  . CBC with Differential/Platelet  . Comprehensive metabolic panel  . Lactate dehydrogenase      Kevin Jack, MD [place] [phone]

## 2019-01-04 DIAGNOSIS — Z299 Encounter for prophylactic measures, unspecified: Secondary | ICD-10-CM | POA: Diagnosis not present

## 2019-01-04 DIAGNOSIS — Z6829 Body mass index (BMI) 29.0-29.9, adult: Secondary | ICD-10-CM | POA: Diagnosis not present

## 2019-01-04 DIAGNOSIS — E1165 Type 2 diabetes mellitus with hyperglycemia: Secondary | ICD-10-CM | POA: Diagnosis not present

## 2019-01-04 DIAGNOSIS — Z713 Dietary counseling and surveillance: Secondary | ICD-10-CM | POA: Diagnosis not present

## 2019-01-04 DIAGNOSIS — I10 Essential (primary) hypertension: Secondary | ICD-10-CM | POA: Diagnosis not present

## 2019-02-07 DIAGNOSIS — H524 Presbyopia: Secondary | ICD-10-CM | POA: Diagnosis not present

## 2019-02-27 DIAGNOSIS — H2511 Age-related nuclear cataract, right eye: Secondary | ICD-10-CM | POA: Diagnosis not present

## 2019-02-27 DIAGNOSIS — H2513 Age-related nuclear cataract, bilateral: Secondary | ICD-10-CM | POA: Diagnosis not present

## 2019-04-12 DIAGNOSIS — Z6830 Body mass index (BMI) 30.0-30.9, adult: Secondary | ICD-10-CM | POA: Diagnosis not present

## 2019-04-12 DIAGNOSIS — I10 Essential (primary) hypertension: Secondary | ICD-10-CM | POA: Diagnosis not present

## 2019-04-12 DIAGNOSIS — Z299 Encounter for prophylactic measures, unspecified: Secondary | ICD-10-CM | POA: Diagnosis not present

## 2019-04-12 DIAGNOSIS — J439 Emphysema, unspecified: Secondary | ICD-10-CM | POA: Diagnosis not present

## 2019-04-12 DIAGNOSIS — E1165 Type 2 diabetes mellitus with hyperglycemia: Secondary | ICD-10-CM | POA: Diagnosis not present

## 2019-04-23 DIAGNOSIS — H25811 Combined forms of age-related cataract, right eye: Secondary | ICD-10-CM | POA: Diagnosis not present

## 2019-04-23 DIAGNOSIS — H2511 Age-related nuclear cataract, right eye: Secondary | ICD-10-CM | POA: Diagnosis not present

## 2019-04-24 DIAGNOSIS — H25042 Posterior subcapsular polar age-related cataract, left eye: Secondary | ICD-10-CM | POA: Diagnosis not present

## 2019-04-24 DIAGNOSIS — H25012 Cortical age-related cataract, left eye: Secondary | ICD-10-CM | POA: Diagnosis not present

## 2019-04-24 DIAGNOSIS — H2512 Age-related nuclear cataract, left eye: Secondary | ICD-10-CM | POA: Diagnosis not present

## 2019-04-30 DIAGNOSIS — H25812 Combined forms of age-related cataract, left eye: Secondary | ICD-10-CM | POA: Diagnosis not present

## 2019-04-30 DIAGNOSIS — H2512 Age-related nuclear cataract, left eye: Secondary | ICD-10-CM | POA: Diagnosis not present

## 2019-05-28 DIAGNOSIS — Z299 Encounter for prophylactic measures, unspecified: Secondary | ICD-10-CM | POA: Diagnosis not present

## 2019-05-28 DIAGNOSIS — J449 Chronic obstructive pulmonary disease, unspecified: Secondary | ICD-10-CM | POA: Diagnosis not present

## 2019-05-28 DIAGNOSIS — E1165 Type 2 diabetes mellitus with hyperglycemia: Secondary | ICD-10-CM | POA: Diagnosis not present

## 2019-05-28 DIAGNOSIS — Z6829 Body mass index (BMI) 29.0-29.9, adult: Secondary | ICD-10-CM | POA: Diagnosis not present

## 2019-05-28 DIAGNOSIS — I10 Essential (primary) hypertension: Secondary | ICD-10-CM | POA: Diagnosis not present

## 2019-05-28 DIAGNOSIS — J439 Emphysema, unspecified: Secondary | ICD-10-CM | POA: Diagnosis not present

## 2019-06-11 ENCOUNTER — Inpatient Hospital Stay (HOSPITAL_COMMUNITY): Payer: PPO | Attending: Hematology

## 2019-06-11 ENCOUNTER — Other Ambulatory Visit: Payer: Self-pay

## 2019-06-11 DIAGNOSIS — Z7982 Long term (current) use of aspirin: Secondary | ICD-10-CM | POA: Diagnosis not present

## 2019-06-11 DIAGNOSIS — Z87891 Personal history of nicotine dependence: Secondary | ICD-10-CM | POA: Diagnosis not present

## 2019-06-11 DIAGNOSIS — Z96653 Presence of artificial knee joint, bilateral: Secondary | ICD-10-CM | POA: Diagnosis not present

## 2019-06-11 DIAGNOSIS — R591 Generalized enlarged lymph nodes: Secondary | ICD-10-CM | POA: Insufficient documentation

## 2019-06-11 DIAGNOSIS — Z808 Family history of malignant neoplasm of other organs or systems: Secondary | ICD-10-CM | POA: Diagnosis not present

## 2019-06-11 DIAGNOSIS — I10 Essential (primary) hypertension: Secondary | ICD-10-CM | POA: Diagnosis not present

## 2019-06-11 LAB — CBC WITH DIFFERENTIAL/PLATELET
Abs Immature Granulocytes: 0.01 10*3/uL (ref 0.00–0.07)
Basophils Absolute: 0.1 10*3/uL (ref 0.0–0.1)
Basophils Relative: 1 %
Eosinophils Absolute: 0.6 10*3/uL — ABNORMAL HIGH (ref 0.0–0.5)
Eosinophils Relative: 9 %
HCT: 40.9 % (ref 39.0–52.0)
Hemoglobin: 13.8 g/dL (ref 13.0–17.0)
Immature Granulocytes: 0 %
Lymphocytes Relative: 29 %
Lymphs Abs: 2.1 10*3/uL (ref 0.7–4.0)
MCH: 30.3 pg (ref 26.0–34.0)
MCHC: 33.7 g/dL (ref 30.0–36.0)
MCV: 89.9 fL (ref 80.0–100.0)
Monocytes Absolute: 0.5 10*3/uL (ref 0.1–1.0)
Monocytes Relative: 8 %
Neutro Abs: 3.7 10*3/uL (ref 1.7–7.7)
Neutrophils Relative %: 53 %
Platelets: 207 10*3/uL (ref 150–400)
RBC: 4.55 MIL/uL (ref 4.22–5.81)
RDW: 13.1 % (ref 11.5–15.5)
WBC: 7 10*3/uL (ref 4.0–10.5)
nRBC: 0 % (ref 0.0–0.2)

## 2019-06-11 LAB — LACTATE DEHYDROGENASE: LDH: 144 U/L (ref 98–192)

## 2019-06-11 LAB — COMPREHENSIVE METABOLIC PANEL
ALT: 19 U/L (ref 0–44)
AST: 17 U/L (ref 15–41)
Albumin: 3.6 g/dL (ref 3.5–5.0)
Alkaline Phosphatase: 55 U/L (ref 38–126)
Anion gap: 9 (ref 5–15)
BUN: 38 mg/dL — ABNORMAL HIGH (ref 8–23)
CO2: 21 mmol/L — ABNORMAL LOW (ref 22–32)
Calcium: 8.6 mg/dL — ABNORMAL LOW (ref 8.9–10.3)
Chloride: 110 mmol/L (ref 98–111)
Creatinine, Ser: 1.48 mg/dL — ABNORMAL HIGH (ref 0.61–1.24)
GFR calc Af Amer: 54 mL/min — ABNORMAL LOW (ref >60)
GFR calc non Af Amer: 47 mL/min — ABNORMAL LOW (ref >60)
Glucose, Bld: 142 mg/dL — ABNORMAL HIGH (ref 70–99)
Potassium: 4.4 mmol/L (ref 3.5–5.1)
Sodium: 140 mmol/L (ref 135–145)
Total Bilirubin: 0.9 mg/dL (ref 0.3–1.2)
Total Protein: 6.9 g/dL (ref 6.5–8.1)

## 2019-06-17 ENCOUNTER — Other Ambulatory Visit: Payer: Self-pay

## 2019-06-18 ENCOUNTER — Inpatient Hospital Stay (HOSPITAL_BASED_OUTPATIENT_CLINIC_OR_DEPARTMENT_OTHER): Payer: PPO | Admitting: Hematology

## 2019-06-18 DIAGNOSIS — R591 Generalized enlarged lymph nodes: Secondary | ICD-10-CM | POA: Diagnosis not present

## 2019-06-18 NOTE — Progress Notes (Signed)
Kevin Hooper, [place] [address]   CLINIC:  Medical Oncology/Hematology  PCP:  Monico Blitz, [place] [address] [phone]   REASON FOR VISIT:  Follow-up for lymphadenopathy  CURRENT THERAPY: Clinical surveillance    INTERVAL HISTORY:  Kevin Hooper 73 y.o. male presents today for follow-up.  His wife accompanies him.  Reports overall doing well.  He denies any changes in his health history since his last visit.  He denies any significant fatigue.  Denies any new lymphadenopathy.  Denies any fevers, chills, night sweats.  No weight loss.  Appetite is stable.  No change in bowel habits.  No abdominal pain.  He follows up with his PCP regularly.  He is here for repeat labs and office visit.   REVIEW OF SYSTEMS:  Review of Systems  Constitutional: Negative.   HENT:  Negative.   Eyes: Negative.   Respiratory: Negative.   Cardiovascular: Negative.   Gastrointestinal: Negative.   Endocrine: Negative.   Genitourinary: Negative.    Musculoskeletal: Positive for arthralgias, back pain and myalgias.  Skin: Negative.   Neurological: Negative.   Hematological: Negative.   Psychiatric/Behavioral: Negative.      PAST MEDICAL/SURGICAL HISTORY:  Past Medical History:  Diagnosis Date  . Arthritis   . Cataracts, bilateral 11/2016  . Hyperlipidemia   . Hypertension   . Joint ache   . Prostate atrophy 2018   Past Surgical History:  Procedure Laterality Date  . COLON RESECTION  2015  . HERNIA REPAIR    . LUMBAR FUSION  1990  . OTHER SURGICAL HISTORY Left    Arm s/p accident  . REPLACEMENT TOTAL KNEE BILATERAL Bilateral 1990  . TRANSURETHRAL RESECTION OF PROSTATE N/A 07/04/2017   Procedure: TRANSURETHRAL RESECTION OF THE PROSTATE (TURP);  Surgeon: Irine Seal, MD;  Location: [facility];  Service: Urology;  Laterality: N/A;     SOCIAL HISTORY:  Social History   Socioeconomic History  . Marital status: Married    Spouse name:  Not on file  . Number of children: 2  . Years of education: 12+  . Highest education level: Not on file  Occupational History  . Occupation: Retired  Scientific laboratory technician  . Financial resource strain: Not on file  . Food insecurity    Worry: Not on file    Inability: Not on file  . Transportation needs    Medical: Not on file    Non-medical: Not on file  Tobacco Use  . Smoking status: Former Smoker    Quit date: 04/28/1992    Years since quitting: 27.1  . Smokeless tobacco: Never Used  Substance and Sexual Activity  . Alcohol use: Yes    Comment: Occasional  . Drug use: No  . Sexual activity: Not on file    Comment: Married  Lifestyle  . Physical activity    Days per week: Not on file    Minutes per session: Not on file  . Stress: Not on file  Relationships  . Social Herbalist on phone: Not on file    Gets together: Not on file    Attends religious service: Not on file    Active member of club or organization: Not on file    Attends meetings of clubs or organizations: Not on file    Relationship status: Not on file  . Intimate partner violence    Fear of current or ex partner: Not on file  Emotionally abused: Not on file    Physically abused: Not on file    Forced sexual activity: Not on file  Other Topics Concern  . Not on file  Social History Narrative   Lives at home w/ his wife   Right-handed   Caffeine: occasional tea    FAMILY HISTORY:  Family History  Problem Relation Age of Onset  . COPD Father   . Throat cancer Father   . Cancer Brother     CURRENT MEDICATIONS:  Outpatient Encounter Medications as of 06/18/2019  Medication Sig Note  . aspirin EC 81 MG tablet Take 81 mg by mouth daily.   . Cholecalciferol (VITAMIN D3) 125 MCG (5000 UT) CAPS Take by mouth daily.   . diclofenac (VOLTAREN) 75 MG EC tablet Take 75 mg by mouth 2 (two) times daily.   Marland Kitchen glucosamine-chondroitin 500-400 MG tablet Take 1 tablet by mouth daily.   Marland Kitchen lisinopril  (PRINIVIL,ZESTRIL) 10 MG tablet Take 10 mg by mouth every evening.  04/28/2016: Received from: External Pharmacy Received Sig: TAKE ONE TABLET BY MOUTH DAILY.  . metoprolol succinate (TOPROL-XL) 50 MG 24 hr tablet TAKE 1 TABLET BY MOUTH TWICE DAILY   . Multiple Vitamin (MULTIVITAMIN WITH MINERALS) TABS tablet Take 1 tablet by mouth daily.   . Omega-3 Fatty Acids (FISH OIL) 1000 MG CAPS Take by mouth daily.   . vitamin C (ASCORBIC ACID) 500 MG tablet Take 500 mg by mouth daily.   Marland Kitchen albuterol (PROVENTIL HFA;VENTOLIN HFA) 108 (90 Base) MCG/ACT inhaler Inhale 2 puffs into the lungs every 6 (six) hours as needed for wheezing or shortness of breath.    . naproxen sodium (ALEVE) 220 MG tablet Take 220 mg by mouth daily as needed.   . [DISCONTINUED] acetaminophen (TYLENOL) 500 MG tablet Take 500 mg by mouth every 8 (eight) hours as needed for mild pain or headache.    No facility-administered encounter medications on file as of 06/18/2019.     ALLERGIES:  No Known Allergies   PHYSICAL EXAM:  ECOG Performance status: 1  Vitals:   06/18/19 1205  BP: (!) 162/68  Pulse: (!) 52  Resp: 18  Temp: 97.8 F (36.6 C)  SpO2: 99%   Filed Weights   06/18/19 1205  Weight: 188 lb (85.3 kg)    Physical Exam Constitutional:      Appearance: Normal appearance.  HENT:     Head: Normocephalic.     Right Ear: External ear normal.     Left Ear: External ear normal.     Nose: Nose normal.     Mouth/Throat:     Pharynx: Oropharynx is clear.  Eyes:     Conjunctiva/sclera: Conjunctivae normal.  Neck:     Musculoskeletal: Normal range of motion.  Cardiovascular:     Rate and Rhythm: Normal rate and regular rhythm.     Pulses: Normal pulses.     Heart sounds: Normal heart sounds.  Pulmonary:     Effort: Pulmonary effort is normal.     Breath sounds: Normal breath sounds.  Abdominal:     General: Bowel sounds are normal.  Musculoskeletal: Normal range of motion.  Skin:    General: Skin is warm and  dry.  Neurological:     General: No focal deficit present.     Mental Status: He is alert and oriented to person, place, and time.  Psychiatric:        Mood and Affect: Mood normal.  Behavior: Behavior normal.        Thought Content: Thought content normal.        Judgment: Judgment normal.      LABORATORY DATA:  I have reviewed the labs as listed.  CBC    Component Value Date/Time   WBC 7.0 06/11/2019 1105   RBC 4.55 06/11/2019 1105   HGB 13.8 06/11/2019 1105   HCT 40.9 06/11/2019 1105   PLT 207 06/11/2019 1105   MCV 89.9 06/11/2019 1105   MCH 30.3 06/11/2019 1105   MCHC 33.7 06/11/2019 1105   RDW 13.1 06/11/2019 1105   LYMPHSABS 2.1 06/11/2019 1105   MONOABS 0.5 06/11/2019 1105   EOSABS 0.6 (H) 06/11/2019 1105   BASOSABS 0.1 06/11/2019 1105   CMP Latest Ref Rng & Units 06/11/2019 12/10/2018 05/04/2018  Glucose 70 - 99 mg/dL 142(H) 137(H) 83  BUN 8 - 23 mg/dL 38(H) 23 35(H)  Creatinine 0.61 - 1.24 mg/dL 1.48(H) 1.15 1.53(H)  Sodium 135 - 145 mmol/L 140 140 141  Potassium 3.5 - 5.1 mmol/L 4.4 4.2 4.3  Chloride 98 - 111 mmol/L 110 107 110  CO2 22 - 32 mmol/L 21(L) 26 24  Calcium 8.9 - 10.3 mg/dL 8.6(L) 9.2 8.8(L)  Total Protein 6.5 - 8.1 g/dL 6.9 7.2 7.4  Total Bilirubin 0.3 - 1.2 mg/dL 0.9 0.6 0.8  Alkaline Phos 38 - 126 U/L 55 63 61  AST 15 - 41 U/L 17 15 17   ALT 0 - 44 U/L 19 20 23           ASSESSMENT & PLAN:   Lymphadenopathy 1.  Highly likely low-grade lymphoma: - Recent CT for hematuria work-up on 04/05/2018 showed several retroperitoneal lymph nodes measuring 1.2 cm and pelvic lymph nodes bilaterally, largest in the left obturator region measuring 2.2 cm. -Patient does not have any B symptoms including fevers, night sweats or weight loss. - CT scan from 2010 done at [facility] also showed lymphadenopathy and splenomegaly.  - PET/CT scan on 05/28/2018 showed very low metabolic activity associated with enlarged pelvic and  retroperitoneal and axillary nodes.  Spleen and bone marrow are normal.  We discussed the normal progression of low-grade lymphomas in detail. - Physical exam today did not reveal any lymphadenopathy.  He denied any fevers, night sweats or weight loss.  No recurrent infections or hospitalizations. -We will consider biopsy of the lymph nodes if he develops any B symptoms or cytopenias or recurrent infections. -We reviewed his blood work is grossly unchanged.  -He will come back in 6 months for follow-up with repeat labs and physical exam.           [place] [phone]

## 2019-06-18 NOTE — Assessment & Plan Note (Signed)
1.  Highly likely low-grade lymphoma: - Recent CT for hematuria work-up on 04/05/2018 showed several retroperitoneal lymph nodes measuring 1.2 cm and pelvic lymph nodes bilaterally, largest in the left obturator region measuring 2.2 cm. -Patient does not have any B symptoms including fevers, night sweats or weight loss. - CT scan from 2010 done at [facility] also showed lymphadenopathy and splenomegaly.  - PET/CT scan on 05/28/2018 showed very low metabolic activity associated with enlarged pelvic and retroperitoneal and axillary nodes.  Spleen and bone marrow are normal.  We discussed the normal progression of low-grade lymphomas in detail. - Physical exam today did not reveal any lymphadenopathy.  He denied any fevers, night sweats or weight loss.  No recurrent infections or hospitalizations. -We will consider biopsy of the lymph nodes if he develops any B symptoms or cytopenias or recurrent infections. -We reviewed his blood work is grossly unchanged.  -He will come back in 6 months for follow-up with repeat labs and physical exam.

## 2019-07-19 DIAGNOSIS — J449 Chronic obstructive pulmonary disease, unspecified: Secondary | ICD-10-CM | POA: Diagnosis not present

## 2019-07-19 DIAGNOSIS — E1165 Type 2 diabetes mellitus with hyperglycemia: Secondary | ICD-10-CM | POA: Diagnosis not present

## 2019-07-19 DIAGNOSIS — Z6830 Body mass index (BMI) 30.0-30.9, adult: Secondary | ICD-10-CM | POA: Diagnosis not present

## 2019-07-19 DIAGNOSIS — I10 Essential (primary) hypertension: Secondary | ICD-10-CM | POA: Diagnosis not present

## 2019-07-19 DIAGNOSIS — Z299 Encounter for prophylactic measures, unspecified: Secondary | ICD-10-CM | POA: Diagnosis not present

## 2019-09-12 DIAGNOSIS — D2339 Other benign neoplasm of skin of other parts of face: Secondary | ICD-10-CM | POA: Diagnosis not present

## 2019-09-12 DIAGNOSIS — D230 Other benign neoplasm of skin of lip: Secondary | ICD-10-CM | POA: Diagnosis not present

## 2019-09-12 DIAGNOSIS — D1801 Hemangioma of skin and subcutaneous tissue: Secondary | ICD-10-CM | POA: Diagnosis not present

## 2019-09-14 ENCOUNTER — Other Ambulatory Visit: Payer: Self-pay

## 2019-09-14 ENCOUNTER — Ambulatory Visit: Payer: PPO | Attending: Internal Medicine

## 2019-09-14 DIAGNOSIS — Z23 Encounter for immunization: Secondary | ICD-10-CM | POA: Insufficient documentation

## 2019-09-14 NOTE — Progress Notes (Signed)
   Covid-19 Vaccination Clinic  Name:  Kevin Hooper    MRN: [id number] DOB: 06/07/1946  09/14/2019  [name] was observed post Covid-19 immunization for 15 minutes without incidence. He was provided with Vaccine Information Sheet and instruction to access the V-Safe system.   [name] was instructed to call 911 with any severe reactions post vaccine: Marland Kitchen Difficulty breathing  . Swelling of your face and throat  . A fast heartbeat  . A bad rash all over your body  . Dizziness and weakness    Immunizations Administered    Name Date Dose VIS Date Route   Moderna COVID-19 Vaccine 09/14/2019 12:39 PM 0.5 mL 07/02/2019 Intramuscular   Manufacturer: Moderna   Lot: [id number]   [id number]

## 2019-10-12 ENCOUNTER — Ambulatory Visit: Payer: PPO | Attending: Internal Medicine

## 2019-10-12 DIAGNOSIS — Z23 Encounter for immunization: Secondary | ICD-10-CM

## 2019-10-12 NOTE — Progress Notes (Signed)
   Covid-19 Vaccination Clinic  Name:  Kevin Hooper    MRN: [id number] DOB: [birth year]  10/12/2019  [name] was observed post Covid-19 immunization for 15 minutes without incident. He was provided with Vaccine Information Sheet and instruction to access the V-Safe system.   [name] was instructed to call 911 with any severe reactions post vaccine: Marland Kitchen Difficulty breathing  . Swelling of face and throat  . A fast heartbeat  . A bad rash all over body  . Dizziness and weakness   Immunizations Administered    Name Date Dose VIS Date Route   Pfizer COVID-19 Vaccine 10/12/2019 10:57 AM 0.3 mL 07/12/2019 Intramuscular   Manufacturer: [place]   Lot: [id number]   [place]: [id number]

## 2019-10-29 DIAGNOSIS — F419 Anxiety disorder, unspecified: Secondary | ICD-10-CM | POA: Diagnosis not present

## 2019-10-29 DIAGNOSIS — Z7189 Other specified counseling: Secondary | ICD-10-CM | POA: Diagnosis not present

## 2019-10-29 DIAGNOSIS — Z79899 Other long term (current) drug therapy: Secondary | ICD-10-CM | POA: Diagnosis not present

## 2019-10-29 DIAGNOSIS — E7800 Pure hypercholesterolemia, unspecified: Secondary | ICD-10-CM | POA: Diagnosis not present

## 2019-10-29 DIAGNOSIS — Z1331 Encounter for screening for depression: Secondary | ICD-10-CM | POA: Diagnosis not present

## 2019-10-29 DIAGNOSIS — R5383 Other fatigue: Secondary | ICD-10-CM | POA: Diagnosis not present

## 2019-10-29 DIAGNOSIS — Z6828 Body mass index (BMI) 28.0-28.9, adult: Secondary | ICD-10-CM | POA: Diagnosis not present

## 2019-10-29 DIAGNOSIS — Z1339 Encounter for screening examination for other mental health and behavioral disorders: Secondary | ICD-10-CM | POA: Diagnosis not present

## 2019-10-29 DIAGNOSIS — E1165 Type 2 diabetes mellitus with hyperglycemia: Secondary | ICD-10-CM | POA: Diagnosis not present

## 2019-10-29 DIAGNOSIS — Z125 Encounter for screening for malignant neoplasm of prostate: Secondary | ICD-10-CM | POA: Diagnosis not present

## 2019-10-29 DIAGNOSIS — Z299 Encounter for prophylactic measures, unspecified: Secondary | ICD-10-CM | POA: Diagnosis not present

## 2019-10-29 DIAGNOSIS — Z1211 Encounter for screening for malignant neoplasm of colon: Secondary | ICD-10-CM | POA: Diagnosis not present

## 2019-10-29 DIAGNOSIS — N183 Chronic kidney disease, stage 3 unspecified: Secondary | ICD-10-CM | POA: Diagnosis not present

## 2019-10-29 DIAGNOSIS — E1122 Type 2 diabetes mellitus with diabetic chronic kidney disease: Secondary | ICD-10-CM | POA: Diagnosis not present

## 2019-10-29 DIAGNOSIS — Z1389 Encounter for screening for other disorder: Secondary | ICD-10-CM | POA: Diagnosis not present

## 2019-10-29 DIAGNOSIS — Z0000 Encounter for general adult medical examination without abnormal findings: Secondary | ICD-10-CM | POA: Diagnosis not present

## 2019-10-29 DIAGNOSIS — Z6830 Body mass index (BMI) 30.0-30.9, adult: Secondary | ICD-10-CM | POA: Diagnosis not present

## 2019-12-09 ENCOUNTER — Other Ambulatory Visit (HOSPITAL_COMMUNITY): Payer: Self-pay | Admitting: *Deleted

## 2019-12-09 DIAGNOSIS — R591 Generalized enlarged lymph nodes: Secondary | ICD-10-CM

## 2019-12-10 ENCOUNTER — Other Ambulatory Visit: Payer: Self-pay

## 2019-12-10 ENCOUNTER — Inpatient Hospital Stay (HOSPITAL_COMMUNITY): Payer: PPO | Attending: Hematology

## 2019-12-10 DIAGNOSIS — R591 Generalized enlarged lymph nodes: Secondary | ICD-10-CM | POA: Diagnosis not present

## 2019-12-10 DIAGNOSIS — Z809 Family history of malignant neoplasm, unspecified: Secondary | ICD-10-CM | POA: Insufficient documentation

## 2019-12-10 DIAGNOSIS — Z808 Family history of malignant neoplasm of other organs or systems: Secondary | ICD-10-CM | POA: Insufficient documentation

## 2019-12-10 DIAGNOSIS — Z96653 Presence of artificial knee joint, bilateral: Secondary | ICD-10-CM | POA: Diagnosis not present

## 2019-12-10 DIAGNOSIS — E785 Hyperlipidemia, unspecified: Secondary | ICD-10-CM | POA: Diagnosis not present

## 2019-12-10 DIAGNOSIS — Z87891 Personal history of nicotine dependence: Secondary | ICD-10-CM | POA: Insufficient documentation

## 2019-12-10 DIAGNOSIS — I10 Essential (primary) hypertension: Secondary | ICD-10-CM | POA: Diagnosis not present

## 2019-12-10 LAB — CBC WITH DIFFERENTIAL/PLATELET
Abs Immature Granulocytes: 0.04 10*3/uL (ref 0.00–0.07)
Basophils Absolute: 0.1 10*3/uL (ref 0.0–0.1)
Basophils Relative: 1 %
Eosinophils Absolute: 0.7 10*3/uL — ABNORMAL HIGH (ref 0.0–0.5)
Eosinophils Relative: 9 %
HCT: 37.5 % — ABNORMAL LOW (ref 39.0–52.0)
Hemoglobin: 12.8 g/dL — ABNORMAL LOW (ref 13.0–17.0)
Immature Granulocytes: 1 %
Lymphocytes Relative: 30 %
Lymphs Abs: 2.2 10*3/uL (ref 0.7–4.0)
MCH: 30.7 pg (ref 26.0–34.0)
MCHC: 34.1 g/dL (ref 30.0–36.0)
MCV: 89.9 fL (ref 80.0–100.0)
Monocytes Absolute: 0.5 10*3/uL (ref 0.1–1.0)
Monocytes Relative: 7 %
Neutro Abs: 3.7 10*3/uL (ref 1.7–7.7)
Neutrophils Relative %: 52 %
Platelets: 196 10*3/uL (ref 150–400)
RBC: 4.17 MIL/uL — ABNORMAL LOW (ref 4.22–5.81)
RDW: 13.2 % (ref 11.5–15.5)
WBC: 7.2 10*3/uL (ref 4.0–10.5)
nRBC: 0 % (ref 0.0–0.2)

## 2019-12-10 LAB — COMPREHENSIVE METABOLIC PANEL
ALT: 15 U/L (ref 0–44)
AST: 15 U/L (ref 15–41)
Albumin: 3.6 g/dL (ref 3.5–5.0)
Alkaline Phosphatase: 55 U/L (ref 38–126)
Anion gap: 8 (ref 5–15)
BUN: 28 mg/dL — ABNORMAL HIGH (ref 8–23)
CO2: 25 mmol/L (ref 22–32)
Calcium: 8.8 mg/dL — ABNORMAL LOW (ref 8.9–10.3)
Chloride: 105 mmol/L (ref 98–111)
Creatinine, Ser: 1.35 mg/dL — ABNORMAL HIGH (ref 0.61–1.24)
GFR calc Af Amer: >60 mL/min (ref >60)
GFR calc non Af Amer: 52 mL/min — ABNORMAL LOW (ref >60)
Glucose, Bld: 200 mg/dL — ABNORMAL HIGH (ref 70–99)
Potassium: 4.2 mmol/L (ref 3.5–5.1)
Sodium: 138 mmol/L (ref 135–145)
Total Bilirubin: 1 mg/dL (ref 0.3–1.2)
Total Protein: 7.2 g/dL (ref 6.5–8.1)

## 2019-12-10 LAB — LACTATE DEHYDROGENASE: LDH: 148 U/L (ref 98–192)

## 2019-12-17 ENCOUNTER — Inpatient Hospital Stay (HOSPITAL_COMMUNITY): Payer: PPO | Admitting: Nurse Practitioner

## 2019-12-17 ENCOUNTER — Other Ambulatory Visit: Payer: Self-pay

## 2019-12-17 DIAGNOSIS — R591 Generalized enlarged lymph nodes: Secondary | ICD-10-CM | POA: Diagnosis not present

## 2019-12-17 NOTE — Progress Notes (Signed)
[place], [place] [address]   CLINIC:  Medical Oncology/Hematology  PCP:  Monico Blitz, [place] [address] [phone]   REASON FOR VISIT: Follow-up for lymphadenopathy   CURRENT THERAPY: Clinical surveillance   INTERVAL HISTORY:  Kevin Hooper 74 y.o. male returns for routine follow-up for lymphadenopathy.  Patient reports he has been doing well since his last visit.  He denies any new pains.  He denies any new lumps or bumps present.  He denies any B symptoms including fevers, chills, night sweats or unexplained weight loss. Denies any nausea, vomiting, or diarrhea. Denies any new pains. Had not noticed any recent bleeding such as epistaxis, hematuria or hematochezia. Denies recent chest pain on exertion, shortness of breath on minimal exertion, pre-syncopal episodes, or palpitations. Denies any numbness or tingling in hands or feet. Denies any recent fevers, infections, or recent hospitalizations. Patient reports appetite at 75% and energy level at 75%.  He is eating well maintain his weight at this time.    REVIEW OF SYSTEMS:  Review of Systems  All other systems reviewed and are negative.    PAST MEDICAL/SURGICAL HISTORY:  Past Medical History:  Diagnosis Date  . Arthritis   . Cataracts, bilateral 11/2016  . Hyperlipidemia   . Hypertension   . Joint ache   . Prostate atrophy 2018   Past Surgical History:  Procedure Laterality Date  . COLON RESECTION  2015  . HERNIA REPAIR    . LUMBAR FUSION  1990  . OTHER SURGICAL HISTORY Left    Arm s/p accident  . REPLACEMENT TOTAL KNEE BILATERAL Bilateral 1990  . TRANSURETHRAL RESECTION OF PROSTATE N/A 07/04/2017   Procedure: TRANSURETHRAL RESECTION OF THE PROSTATE (TURP);  Surgeon: Irine Seal, MD;  Location: [facility];  Service: Urology;  Laterality: N/A;     SOCIAL HISTORY:  Social History   Socioeconomic History  . Marital status: Married    Spouse name: Not on  file  . Number of children: 2  . Years of education: 12+  . Highest education level: Not on file  Occupational History  . Occupation: Retired  Tobacco Use  . Smoking status: Former Smoker    Quit date: 04/28/1992    Years since quitting: 27.6  . Smokeless tobacco: Never Used  Substance and Sexual Activity  . Alcohol use: Yes    Comment: Occasional  . Drug use: No  . Sexual activity: Not on file    Comment: Married  Other Topics Concern  . Not on file  Social History Narrative   Lives at home w/ his wife   Right-handed   Caffeine: occasional tea   Social Determinants of Health   Financial Resource Strain:   . Difficulty of Paying Living Expenses:   Food Insecurity:   . Worried About Charity fundraiser in the Last Year:   . Arboriculturist in the Last Year:   Transportation Needs:   . Film/video editor (Medical):   Marland Kitchen Lack of Transportation (Non-Medical):   Physical Activity:   . Days of Exercise per Week:   . Minutes of Exercise per Session:   Stress:   . Feeling of Stress :   Social Connections:   . Frequency of Communication with Friends and Family:   . Frequency of Social Gatherings with Friends and Family:   . Attends Religious Services:   . Active Member of Clubs or Organizations:   . Attends Club or  Organization Meetings:   Marland Kitchen Marital Status:   Intimate Partner Violence:   . Fear of Current or Ex-Partner:   . Emotionally Abused:   Marland Kitchen Physically Abused:   . Sexually Abused:     FAMILY HISTORY:  Family History  Problem Relation Age of Onset  . COPD Father   . Throat cancer Father   . Cancer Brother     CURRENT MEDICATIONS:  Outpatient Encounter Medications as of 12/17/2019  Medication Sig Note  . amLODipine (NORVASC) 5 MG tablet Take 5 mg by mouth daily.   . Cholecalciferol (VITAMIN D3) 125 MCG (5000 UT) CAPS Take by mouth daily.   Marland Kitchen glucosamine-chondroitin 500-400 MG tablet Take 1 tablet by mouth daily.   Marland Kitchen lisinopril (PRINIVIL,ZESTRIL) 10 MG  tablet Take 10 mg by mouth every evening.  04/28/2016: Received from: External Pharmacy Received Sig: TAKE ONE TABLET BY MOUTH DAILY.  . metoprolol succinate (TOPROL-XL) 100 MG 24 hr tablet Take 100 mg by mouth daily.    . Multiple Vitamin (MULTIVITAMIN WITH MINERALS) TABS tablet Take 1 tablet by mouth daily.   . Omega-3 Fatty Acids (FISH OIL) 1000 MG CAPS Take by mouth daily.   . vitamin C (ASCORBIC ACID) 500 MG tablet Take 500 mg by mouth daily.   Marland Kitchen albuterol (PROVENTIL HFA;VENTOLIN HFA) 108 (90 Base) MCG/ACT inhaler Inhale 2 puffs into the lungs every 6 (six) hours as needed for wheezing or shortness of breath.    . naproxen sodium (ALEVE) 220 MG tablet Take 220 mg by mouth daily as needed.   . [DISCONTINUED] aspirin EC 81 MG tablet Take 81 mg by mouth daily.   . [DISCONTINUED] diclofenac (VOLTAREN) 75 MG EC tablet Take 75 mg by mouth 2 (two) times daily.    No facility-administered encounter medications on file as of 12/17/2019.    ALLERGIES:  No Known Allergies   PHYSICAL EXAM:  ECOG Performance status: 1  Vitals:   12/17/19 1121  BP: (!) 153/78  Pulse: (!) 57  Resp: 18  Temp: 97.9 F (36.6 C)  SpO2: 98%   Filed Weights   12/17/19 1121  Weight: 190 lb (86.2 kg)      Physical Exam Constitutional:      Appearance: Normal appearance. He is normal weight.  Cardiovascular:     Rate and Rhythm: Normal rate and regular rhythm.     Heart sounds: Normal heart sounds.  Pulmonary:     Effort: Pulmonary effort is normal.     Breath sounds: Normal breath sounds.  Abdominal:     General: Bowel sounds are normal.     Palpations: Abdomen is soft.  Musculoskeletal:        General: Normal range of motion.  Skin:    General: Skin is warm.  Neurological:     Mental Status: He is alert and oriented to person, place, and time. Mental status is at baseline.  Psychiatric:        Mood and Affect: Mood normal.        Behavior: Behavior normal.        Thought Content: Thought content  normal.        Judgment: Judgment normal.      LABORATORY DATA:  I have reviewed the labs as listed.  CBC    Component Value Date/Time   WBC 7.2 12/10/2019 0956   RBC 4.17 (L) 12/10/2019 0956   HGB 12.8 (L) 12/10/2019 0956   HCT 37.5 (L) 12/10/2019 0956   PLT 196 12/10/2019 0956  MCV 89.9 12/10/2019 0956   MCH 30.7 12/10/2019 0956   MCHC 34.1 12/10/2019 0956   RDW 13.2 12/10/2019 0956   LYMPHSABS 2.2 12/10/2019 0956   MONOABS 0.5 12/10/2019 0956   EOSABS 0.7 (H) 12/10/2019 0956   BASOSABS 0.1 12/10/2019 0956   CMP Latest Ref Rng & Units 12/10/2019 06/11/2019 12/10/2018  Glucose 70 - 99 mg/dL 200(H) 142(H) 137(H)  BUN 8 - 23 mg/dL 28(H) 38(H) 23  Creatinine 0.61 - 1.24 mg/dL 1.35(H) 1.48(H) 1.15  Sodium 135 - 145 mmol/L 138 140 140  Potassium 3.5 - 5.1 mmol/L 4.2 4.4 4.2  Chloride 98 - 111 mmol/L 105 110 107  CO2 22 - 32 mmol/L 25 21(L) 26  Calcium 8.9 - 10.3 mg/dL 8.8(L) 8.6(L) 9.2  Total Protein 6.5 - 8.1 g/dL 7.2 6.9 7.2  Total Bilirubin 0.3 - 1.2 mg/dL 1.0 0.9 0.6  Alkaline Phos 38 - 126 U/L 55 55 63  AST 15 - 41 U/L 15 17 15   ALT 0 - 44 U/L 15 19 20     All questions were answered to patient's stated satisfaction. Encouraged patient to call with any new concerns or questions before his next visit to the cancer center and we can certain see him sooner, if needed.     ASSESSMENT & PLAN:  Lymphadenopathy 1.  Highly likely low-grade lymphoma: -Recent CT for hematuria work-up on 04/05/2018 showed several retroperitoneal lymph nodes measuring 1.2 cm and pelvic lymph nodes bilaterally, largest in the left obturator region measuring 2.2 cm. -Patient does not have any B symptoms including fevers, night sweats, chills or unexplained weight loss. -CT scan from 2010 done at [facility] also showed lymphadenopathy and splenomegaly. -PET/CT scan on 05/28/2018 showed very low metabolic activity associated with enlarged pelvic and retroperitoneal and axillary  nodes.  Spleen and bone marrow are normal.  It was discussed with the patient the normal progression of low-grade lymphoma in detail. -Physical examination today did not reveal any lymphadenopathy.  No recurrent infections or hospitalizations. -We will consider biopsy of the lymph nodes if he develops any B symptoms or cytopenias or recurrent infections. -Labs done on 12/10/2019 showed hemoglobin 12.8, LDH 148, platelets 196, WBC 7.2, creatinine 1.35 -She will come back in 6 months with repeat labs and physical exam.     Orders placed this encounter:  Orders Placed This Encounter  Procedures  . Lactate dehydrogenase  . Ferritin  . Iron and TIBC  . Magnesium  . CBC with Differential/Platelet  . Comprehensive metabolic panel  . Vitamin B12  . VITAMIN D 25 Hydroxy (Vit-D Deficiency, Fractures)  . Folate      Francene Finders, FNP-C [facility] [phone]

## 2019-12-17 NOTE — Assessment & Plan Note (Addendum)
1.  Highly likely low-grade lymphoma: -Recent CT for hematuria work-up on 04/05/2018 showed several retroperitoneal lymph nodes measuring 1.2 cm and pelvic lymph nodes bilaterally, largest in the left obturator region measuring 2.2 cm. -Patient does not have any B symptoms including fevers, night sweats, chills or unexplained weight loss. -CT scan from 2010 done at [facility] also showed lymphadenopathy and splenomegaly. -PET/CT scan on 05/28/2018 showed very low metabolic activity associated with enlarged pelvic and retroperitoneal and axillary nodes.  Spleen and bone marrow are normal.  It was discussed with the patient the normal progression of low-grade lymphoma in detail. -Physical examination today did not reveal any lymphadenopathy.  No recurrent infections or hospitalizations. -We will consider biopsy of the lymph nodes if he develops any B symptoms or cytopenias or recurrent infections. -Labs done on 12/10/2019 showed hemoglobin 12.8, LDH 148, platelets 196, WBC 7.2, creatinine 1.35 -She will come back in 6 months with repeat labs and physical exam.

## 2020-01-30 DIAGNOSIS — R252 Cramp and spasm: Secondary | ICD-10-CM | POA: Diagnosis not present

## 2020-01-30 DIAGNOSIS — E1165 Type 2 diabetes mellitus with hyperglycemia: Secondary | ICD-10-CM | POA: Diagnosis not present

## 2020-01-30 DIAGNOSIS — Z87891 Personal history of nicotine dependence: Secondary | ICD-10-CM | POA: Diagnosis not present

## 2020-01-30 DIAGNOSIS — I10 Essential (primary) hypertension: Secondary | ICD-10-CM | POA: Diagnosis not present

## 2020-01-30 DIAGNOSIS — Z299 Encounter for prophylactic measures, unspecified: Secondary | ICD-10-CM | POA: Diagnosis not present

## 2020-01-30 DIAGNOSIS — N183 Chronic kidney disease, stage 3 unspecified: Secondary | ICD-10-CM | POA: Diagnosis not present

## 2020-01-30 DIAGNOSIS — E1122 Type 2 diabetes mellitus with diabetic chronic kidney disease: Secondary | ICD-10-CM | POA: Diagnosis not present

## 2020-01-30 DIAGNOSIS — Z6830 Body mass index (BMI) 30.0-30.9, adult: Secondary | ICD-10-CM | POA: Diagnosis not present

## 2020-05-15 DIAGNOSIS — E1165 Type 2 diabetes mellitus with hyperglycemia: Secondary | ICD-10-CM | POA: Diagnosis not present

## 2020-05-15 DIAGNOSIS — Z6830 Body mass index (BMI) 30.0-30.9, adult: Secondary | ICD-10-CM | POA: Diagnosis not present

## 2020-05-15 DIAGNOSIS — R079 Chest pain, unspecified: Secondary | ICD-10-CM | POA: Diagnosis not present

## 2020-05-15 DIAGNOSIS — J439 Emphysema, unspecified: Secondary | ICD-10-CM | POA: Diagnosis not present

## 2020-05-15 DIAGNOSIS — Z23 Encounter for immunization: Secondary | ICD-10-CM | POA: Diagnosis not present

## 2020-05-15 DIAGNOSIS — I10 Essential (primary) hypertension: Secondary | ICD-10-CM | POA: Diagnosis not present

## 2020-05-15 DIAGNOSIS — M940 Chondrocostal junction syndrome [Tietze]: Secondary | ICD-10-CM | POA: Diagnosis not present

## 2020-05-15 DIAGNOSIS — Z299 Encounter for prophylactic measures, unspecified: Secondary | ICD-10-CM | POA: Diagnosis not present

## 2020-05-25 DIAGNOSIS — Z713 Dietary counseling and surveillance: Secondary | ICD-10-CM | POA: Diagnosis not present

## 2020-05-25 DIAGNOSIS — Z299 Encounter for prophylactic measures, unspecified: Secondary | ICD-10-CM | POA: Diagnosis not present

## 2020-05-25 DIAGNOSIS — I10 Essential (primary) hypertension: Secondary | ICD-10-CM | POA: Diagnosis not present

## 2020-05-25 DIAGNOSIS — Z6830 Body mass index (BMI) 30.0-30.9, adult: Secondary | ICD-10-CM | POA: Diagnosis not present

## 2020-05-25 DIAGNOSIS — R1011 Right upper quadrant pain: Secondary | ICD-10-CM | POA: Diagnosis not present

## 2020-05-28 DIAGNOSIS — R161 Splenomegaly, not elsewhere classified: Secondary | ICD-10-CM | POA: Diagnosis not present

## 2020-05-28 DIAGNOSIS — R1011 Right upper quadrant pain: Secondary | ICD-10-CM | POA: Diagnosis not present

## 2020-06-03 DIAGNOSIS — Z6830 Body mass index (BMI) 30.0-30.9, adult: Secondary | ICD-10-CM | POA: Diagnosis not present

## 2020-06-03 DIAGNOSIS — M5134 Other intervertebral disc degeneration, thoracic region: Secondary | ICD-10-CM | POA: Diagnosis not present

## 2020-06-03 DIAGNOSIS — M546 Pain in thoracic spine: Secondary | ICD-10-CM | POA: Diagnosis not present

## 2020-06-03 DIAGNOSIS — I10 Essential (primary) hypertension: Secondary | ICD-10-CM | POA: Diagnosis not present

## 2020-06-03 DIAGNOSIS — E1122 Type 2 diabetes mellitus with diabetic chronic kidney disease: Secondary | ICD-10-CM | POA: Diagnosis not present

## 2020-06-03 DIAGNOSIS — Z299 Encounter for prophylactic measures, unspecified: Secondary | ICD-10-CM | POA: Diagnosis not present

## 2020-06-03 DIAGNOSIS — M549 Dorsalgia, unspecified: Secondary | ICD-10-CM | POA: Diagnosis not present

## 2020-06-03 DIAGNOSIS — N183 Chronic kidney disease, stage 3 unspecified: Secondary | ICD-10-CM | POA: Diagnosis not present

## 2020-06-08 ENCOUNTER — Other Ambulatory Visit (HOSPITAL_COMMUNITY): Payer: Self-pay | Admitting: Surgery

## 2020-06-08 DIAGNOSIS — R591 Generalized enlarged lymph nodes: Secondary | ICD-10-CM

## 2020-06-09 ENCOUNTER — Inpatient Hospital Stay (HOSPITAL_COMMUNITY): Payer: PPO | Attending: Hematology

## 2020-06-09 ENCOUNTER — Other Ambulatory Visit: Payer: Self-pay

## 2020-06-09 DIAGNOSIS — R591 Generalized enlarged lymph nodes: Secondary | ICD-10-CM | POA: Diagnosis not present

## 2020-06-09 DIAGNOSIS — Z87891 Personal history of nicotine dependence: Secondary | ICD-10-CM | POA: Diagnosis not present

## 2020-06-09 DIAGNOSIS — Z96653 Presence of artificial knee joint, bilateral: Secondary | ICD-10-CM | POA: Diagnosis not present

## 2020-06-09 DIAGNOSIS — Z808 Family history of malignant neoplasm of other organs or systems: Secondary | ICD-10-CM | POA: Insufficient documentation

## 2020-06-09 DIAGNOSIS — R0781 Pleurodynia: Secondary | ICD-10-CM | POA: Diagnosis not present

## 2020-06-09 DIAGNOSIS — D649 Anemia, unspecified: Secondary | ICD-10-CM | POA: Diagnosis not present

## 2020-06-09 DIAGNOSIS — Z809 Family history of malignant neoplasm, unspecified: Secondary | ICD-10-CM | POA: Diagnosis not present

## 2020-06-09 DIAGNOSIS — M1990 Unspecified osteoarthritis, unspecified site: Secondary | ICD-10-CM | POA: Diagnosis not present

## 2020-06-09 LAB — CBC WITH DIFFERENTIAL/PLATELET
Abs Immature Granulocytes: 0.03 10*3/uL (ref 0.00–0.07)
Basophils Absolute: 0.1 10*3/uL (ref 0.0–0.1)
Basophils Relative: 1 %
Eosinophils Absolute: 0.6 10*3/uL — ABNORMAL HIGH (ref 0.0–0.5)
Eosinophils Relative: 8 %
HCT: 36.5 % — ABNORMAL LOW (ref 39.0–52.0)
Hemoglobin: 12.6 g/dL — ABNORMAL LOW (ref 13.0–17.0)
Immature Granulocytes: 0 %
Lymphocytes Relative: 35 %
Lymphs Abs: 2.4 10*3/uL (ref 0.7–4.0)
MCH: 31 pg (ref 26.0–34.0)
MCHC: 34.5 g/dL (ref 30.0–36.0)
MCV: 89.7 fL (ref 80.0–100.0)
Monocytes Absolute: 0.7 10*3/uL (ref 0.1–1.0)
Monocytes Relative: 9 %
Neutro Abs: 3.3 10*3/uL (ref 1.7–7.7)
Neutrophils Relative %: 47 %
Platelets: 163 10*3/uL (ref 150–400)
RBC: 4.07 MIL/uL — ABNORMAL LOW (ref 4.22–5.81)
RDW: 13.1 % (ref 11.5–15.5)
WBC: 7 10*3/uL (ref 4.0–10.5)
nRBC: 0 % (ref 0.0–0.2)

## 2020-06-09 LAB — COMPREHENSIVE METABOLIC PANEL
ALT: 24 U/L (ref 0–44)
AST: 20 U/L (ref 15–41)
Albumin: 3.5 g/dL (ref 3.5–5.0)
Alkaline Phosphatase: 46 U/L (ref 38–126)
Anion gap: 7 (ref 5–15)
BUN: 34 mg/dL — ABNORMAL HIGH (ref 8–23)
CO2: 24 mmol/L (ref 22–32)
Calcium: 8.5 mg/dL — ABNORMAL LOW (ref 8.9–10.3)
Chloride: 109 mmol/L (ref 98–111)
Creatinine, Ser: 1.49 mg/dL — ABNORMAL HIGH (ref 0.61–1.24)
GFR, Estimated: 50 mL/min — ABNORMAL LOW (ref >60)
Glucose, Bld: 160 mg/dL — ABNORMAL HIGH (ref 70–99)
Potassium: 4.4 mmol/L (ref 3.5–5.1)
Sodium: 140 mmol/L (ref 135–145)
Total Bilirubin: 0.7 mg/dL (ref 0.3–1.2)
Total Protein: 6.9 g/dL (ref 6.5–8.1)

## 2020-06-09 LAB — MAGNESIUM: Magnesium: 1.8 mg/dL (ref 1.7–2.4)

## 2020-06-09 LAB — FERRITIN: Ferritin: 64 ng/mL (ref 24–336)

## 2020-06-09 LAB — FOLATE: Folate: 20.5 ng/mL (ref >5.9)

## 2020-06-09 LAB — IRON AND TIBC
Iron: 56 ug/dL (ref 45–182)
Saturation Ratios: 19 % (ref 17.9–39.5)
TIBC: 299 ug/dL (ref 250–450)
UIBC: 243 ug/dL

## 2020-06-09 LAB — VITAMIN B12: Vitamin B-12: 417 pg/mL (ref 180–914)

## 2020-06-09 LAB — VITAMIN D 25 HYDROXY (VIT D DEFICIENCY, FRACTURES): Vit D, 25-Hydroxy: 58.49 ng/mL (ref 30–100)

## 2020-06-09 LAB — LACTATE DEHYDROGENASE: LDH: 163 U/L (ref 98–192)

## 2020-06-16 ENCOUNTER — Other Ambulatory Visit: Payer: Self-pay

## 2020-06-16 ENCOUNTER — Inpatient Hospital Stay (HOSPITAL_BASED_OUTPATIENT_CLINIC_OR_DEPARTMENT_OTHER): Payer: PPO | Admitting: Hematology

## 2020-06-16 ENCOUNTER — Encounter (HOSPITAL_COMMUNITY): Payer: Self-pay | Admitting: Hematology

## 2020-06-16 ENCOUNTER — Ambulatory Visit (HOSPITAL_COMMUNITY)
Admission: RE | Admit: 2020-06-16 | Discharge: 2020-06-16 | Disposition: A | Discharge status: Home / Self Care | Payer: PPO | Source: Ambulatory Visit | Attending: Hematology | Admitting: Hematology

## 2020-06-16 VITALS — BP 158/79 | HR 66 | Temp 96.9°F | Resp 18 | Wt 184.0 lb

## 2020-06-16 DIAGNOSIS — R0781 Pleurodynia: Secondary | ICD-10-CM | POA: Diagnosis not present

## 2020-06-16 DIAGNOSIS — I700 Atherosclerosis of aorta: Secondary | ICD-10-CM | POA: Diagnosis not present

## 2020-06-16 DIAGNOSIS — R591 Generalized enlarged lymph nodes: Secondary | ICD-10-CM | POA: Diagnosis not present

## 2020-06-16 NOTE — Patient Instructions (Signed)
[place] at [facility] Discharge Instructions  You were seen today by Dr. Delton Coombes. He went over your recent results and scans. You will be scheduled for additional x-rays of your chest. Dr. Delton Coombes will see you back in 6 months for labs and follow up.   Thank you for choosing [place] at [facility] to provide your oncology and hematology care.  To afford each patient quality time with our provider, please arrive at least 15 minutes before your scheduled appointment time.   If you have a lab appointment with the [facility] please come in thru the Main Entrance and check in at the main information desk  You need to re-schedule your appointment should you arrive 10 or more minutes late.  We strive to give you quality time with our providers, and arriving late affects you and other patients whose appointments are after yours.  Also, if you no show three or more times for appointments you may be dismissed from the clinic at the providers discretion.     Again, thank you for choosing [facility].  Our hope is that these requests will decrease the amount of time that you wait before being seen by our physicians.       _____________________________________________________________  Should you have questions after your visit to [facility], please contact our office at [phone] between the hours of 8:00 a.m. and 4:30 p.m.  Voicemails left after 4:00 p.m. will not be returned until the following business day.  For prescription refill requests, have your pharmacy contact our office and allow 72 hours.    Cancer Center Support Programs:   > Cancer Support Group  2nd Tuesday of the month 1pm-2pm, Journey Room

## 2020-06-16 NOTE — Progress Notes (Signed)
[place], [place] [address]   CLINIC:  Medical Oncology/Hematology  PCP:  Monico Blitz, [place] [address]  [phone]  REASON FOR VISIT:  Follow-up for generalized lymphadenopathy  PRIOR THERAPY: None  CURRENT THERAPY: Observation  INTERVAL HISTORY:  [name], a 74 y.o. male, returns for routine follow-up for his generalized lymphadenopathy. Kevin Hooper was last seen on 12/14/2018.  Today he is accompanied by his wife and he reports feeling okay. He reports having intermittent right flank pain; he denies any recent falls or trauma or pain with deep breathing. He denies any association of his pain with foods that he eats. He tried taking OTC meds for his pain, but nothing has helped. He denies recent infections, F/C, night sweats, or unexplained weight loss. He denies having any other aches or pains. He eats his baseline meals, though he has been trying to eat less due to his DM.   REVIEW OF SYSTEMS:  Review of Systems  Constitutional: Negative for chills, diaphoresis, fever and unexpected weight change.  Gastrointestinal: Positive for abdominal pain (4/10 RUQ pain).    PAST MEDICAL/SURGICAL HISTORY:  Past Medical History:  Diagnosis Date   Arthritis    Cataracts, bilateral 11/2016   Hyperlipidemia    Hypertension    Joint ache    Prostate atrophy 2018   Past Surgical History:  Procedure Laterality Date   COLON RESECTION  2015   HERNIA REPAIR     LUMBAR FUSION  1990   OTHER SURGICAL HISTORY Left    Arm s/p accident   REPLACEMENT TOTAL KNEE BILATERAL Bilateral 1990   TRANSURETHRAL RESECTION OF PROSTATE N/A 07/04/2017   Procedure: TRANSURETHRAL RESECTION OF THE PROSTATE (TURP);  Surgeon: Irine Seal, MD;  Location: [facility];  Service: Urology;  Laterality: N/A;    SOCIAL HISTORY:  Social History   Socioeconomic History   Marital status: Married    Spouse name: Not on file   Number of  children: 2   Years of education: 12+   Highest education level: Not on file  Occupational History   Occupation: Retired  Tobacco Use   Smoking status: Former Smoker    Quit date: 04/28/1992    Years since quitting: 28.1   Smokeless tobacco: Never Used  Vaping Use   Vaping Use: Never used  Substance and Sexual Activity   Alcohol use: Yes    Comment: Occasional   Drug use: No   Sexual activity: Not on file    Comment: Married  Other Topics Concern   Not on file  Social History Narrative   Lives at home w/ his wife   Right-handed   Caffeine: occasional tea   Social Determinants of Radio broadcast assistant Strain: Low Risk    Difficulty of Paying Living Expenses: Not hard at all  Food Insecurity: No Food Insecurity   Worried About Charity fundraiser in the Last Year: Never true   Arboriculturist in the Last Year: Never true  Transportation Needs: No Transportation Needs   Lack of Transportation (Medical): No   Lack of Transportation (Non-Medical): No  Physical Activity: Insufficiently Active   Days of Exercise per Week: 7 days   Minutes of Exercise per Session: 20 min  Stress: No Stress Concern Present   Feeling of Stress : Not at all  Social Connections: Moderately Integrated   Frequency of Communication with Friends and Family: More than three times  a week   Frequency of Social Gatherings with Friends and Family: Twice a week   Attends Religious Services: More than 4 times per year   Active Member of [employer] or Organizations: No   Attends Music therapist: Never   Marital Status: Married  Human resources officer Violence: Not At Risk   Fear of Current or Ex-Partner: No   Emotionally Abused: No   Physically Abused: No   Sexually Abused: No    FAMILY HISTORY:  Family History  Problem Relation Age of Onset   COPD Father    Throat cancer Father    Cancer Brother     CURRENT MEDICATIONS:  Current Outpatient Medications    Medication Sig Dispense Refill   albuterol (PROVENTIL HFA;VENTOLIN HFA) 108 (90 Base) MCG/ACT inhaler Inhale 2 puffs into the lungs every 6 (six) hours as needed for wheezing or shortness of breath.      amLODipine (NORVASC) 5 MG tablet Take 5 mg by mouth daily.     Blood Glucose Monitoring Suppl ([place] FLEX SYSTEM) w/Device KIT daily.     Cholecalciferol (VITAMIN D3) 125 MCG (5000 UT) CAPS Take by mouth daily.     glucosamine-chondroitin 500-400 MG tablet Take 1 tablet by mouth daily.     lisinopril (PRINIVIL,ZESTRIL) 10 MG tablet Take 10 mg by mouth every evening.   2   metoprolol succinate (TOPROL-XL) 100 MG 24 hr tablet Take 100 mg by mouth daily.   1   Multiple Vitamin (MULTIVITAMIN WITH MINERALS) TABS tablet Take 1 tablet by mouth daily.     naproxen sodium (ALEVE) 220 MG tablet Take 220 mg by mouth daily as needed.     Omega-3 Fatty Acids (FISH OIL) 1000 MG CAPS Take by mouth daily.     ONETOUCH VERIO test strip 1 each daily.     vitamin C (ASCORBIC ACID) 500 MG tablet Take 500 mg by mouth daily.     No current facility-administered medications for this visit.    ALLERGIES:  No Known Allergies  PHYSICAL EXAM:  Performance status (ECOG): 1 - Symptomatic but completely ambulatory  Vitals:   06/16/20 1355  BP: (!) 158/79  Pulse: 66  Resp: 18  Temp: (!) 96.9 F (36.1 C)  SpO2: 97%   Wt Readings from Last 3 Encounters:  06/16/20 184 lb (83.5 kg)  12/17/19 190 lb (86.2 kg)  06/18/19 188 lb (85.3 kg)   Physical Exam Vitals reviewed.  Constitutional:      Appearance: Normal appearance.  Cardiovascular:     Rate and Rhythm: Normal rate and regular rhythm.     Pulses: Normal pulses.     Heart sounds: Normal heart sounds.  Pulmonary:     Effort: Pulmonary effort is normal.     Breath sounds: Normal breath sounds.  Abdominal:     Palpations: Abdomen is soft. There is no hepatomegaly or mass.     Tenderness: There is abdominal tenderness in the right  upper quadrant.     Hernia: No hernia is present.  Neurological:     General: No focal deficit present.     Mental Status: He is alert and oriented to person, place, and time.  Psychiatric:        Mood and Affect: Mood normal.        Behavior: Behavior normal.     LABORATORY DATA:  I have reviewed the labs as listed.  CBC Latest Ref Rng & Units 06/09/2020 12/10/2019 06/11/2019  WBC 4.0 - 10.5 K/uL  7.0 7.2 7.0  Hemoglobin 13.0 - 17.0 g/dL 12.6(L) 12.8(L) 13.8  Hematocrit 39 - 52 % 36.5(L) 37.5(L) 40.9  Platelets 150 - 400 K/uL 163 196 207   CMP Latest Ref Rng & Units 06/09/2020 12/10/2019 06/11/2019  Glucose 70 - 99 mg/dL 160(H) 200(H) 142(H)  BUN 8 - 23 mg/dL 34(H) 28(H) 38(H)  Creatinine 0.61 - 1.24 mg/dL 1.49(H) 1.35(H) 1.48(H)  Sodium 135 - 145 mmol/L 140 138 140  Potassium 3.5 - 5.1 mmol/L 4.4 4.2 4.4  Chloride 98 - 111 mmol/L 109 105 110  CO2 22 - 32 mmol/L 24 25 21(L)  Calcium 8.9 - 10.3 mg/dL 8.5(L) 8.8(L) 8.6(L)  Total Protein 6.5 - 8.1 g/dL 6.9 7.2 6.9  Total Bilirubin 0.3 - 1.2 mg/dL 0.7 1.0 0.9  Alkaline Phos 38 - 126 U/L 46 55 55  AST 15 - 41 U/L _0 ALT 0 - 44 U/L _1 Component Value Date/Time   RBC 4.07 (L) 06/09/2020 1335   MCV 89.7 06/09/2020 1335   MCH 31.0 06/09/2020 1335   MCHC 34.5 06/09/2020 1335   RDW 13.1 06/09/2020 1335   LYMPHSABS 2.4 06/09/2020 1335   MONOABS 0.7 06/09/2020 1335   EOSABS 0.6 (H) 06/09/2020 1335   BASOSABS 0.1 06/09/2020 1335   Lab Results  Component Value Date   VD25OH 58.49 06/09/2020   Lab Results  Component Value Date   LDH 163 06/09/2020   LDH 148 12/10/2019   LDH 144 06/11/2019   Lab Results  Component Value Date   TIBC 299 06/09/2020   FERRITIN 64 06/09/2020   IRONPCTSAT 19 06/09/2020    DIAGNOSTIC IMAGING:  I have independently reviewed the scans and discussed with the patient. No results found.   ASSESSMENT:  1.  Highly likely low-grade lymphoma: - Recent CT for hematuria work-up on  04/05/2018 showed several retroperitoneal lymph nodes measuring 1.2 cm and pelvic lymph nodes bilaterally, largest in the left obturator region measuring 2.2 cm. -Patient does not have any B symptoms including fevers, night sweats or weight loss. - CT scan from 2010 [facility] at [facility] also showed lymphadenopathy and splenomegaly.  - PET/CT scan on 05/28/2018 showed very low metabolic activity associated with enlarged pelvic and retroperitoneal and axillary nodes.  Spleen and bone marrow are normal.  We discussed the normal progression of low-grade lymphomas in detail.   PLAN:  1.  Highly likely low-grade lymphoma: -He does not have any B symptoms.  No recurrent infections.   -Physical exam today did not reveal any palpable lymphadenopathy. -Reviewed ultrasound of the abdomen from 05/30/2020 from [facility].  Liver was normal.  Splenomegaly measuring 13.4 x 7.8 x 15.4 cm.  I have also compared it with prior CT scan and PET scan. -Reviewed labs from 06/09/2020 which showed normal CBC.  Mild anemia from CKD. -RTC 6 months with labs and physical exam.  2.  Mild normocytic anemia: -Hemoglobin is 12.6 with MCV of 89.  Ferritin is 64 with percent saturation of 19.  B12 was normal. -Anemia from ~1 deficiency and CKD.  3.  Right upper quadrant rib pain: -He developed this pain 3 to 4 months ago while lifting weights. -He was evaluated by Dr.Shah and thoracic spine x-rays were done which showed mild arthritis.  Ultrasound of the abdomen did not show any abnormalities in the liver or gallbladder. -He has more tenderness than pain in the right lower rib region. -I have recommended doing  x-rays of his ribs on the right side.  I have reviewed them myself which did not show any major abnormalities.  We will follow up on the radiology report.   Orders placed this encounter:  No orders of the defined types were placed in this encounter.    Derek Jack, MD [place] [phone]   I, Milinda Antis, am acting as a scribe for Dr. Sanda Linger.  I, Derek Jack MD, have reviewed the above documentation for accuracy and completeness, and I agree with the above.

## 2020-06-22 DIAGNOSIS — R1011 Right upper quadrant pain: Secondary | ICD-10-CM | POA: Diagnosis not present

## 2020-06-22 DIAGNOSIS — Z6829 Body mass index (BMI) 29.0-29.9, adult: Secondary | ICD-10-CM | POA: Diagnosis not present

## 2020-06-22 DIAGNOSIS — E1165 Type 2 diabetes mellitus with hyperglycemia: Secondary | ICD-10-CM | POA: Diagnosis not present

## 2020-06-22 DIAGNOSIS — Z299 Encounter for prophylactic measures, unspecified: Secondary | ICD-10-CM | POA: Diagnosis not present

## 2020-06-22 DIAGNOSIS — I10 Essential (primary) hypertension: Secondary | ICD-10-CM | POA: Diagnosis not present

## 2020-06-24 ENCOUNTER — Encounter (INDEPENDENT_AMBULATORY_CARE_PROVIDER_SITE_OTHER): Payer: Self-pay | Admitting: *Deleted

## 2020-08-25 DIAGNOSIS — J449 Chronic obstructive pulmonary disease, unspecified: Secondary | ICD-10-CM | POA: Diagnosis not present

## 2020-08-25 DIAGNOSIS — I10 Essential (primary) hypertension: Secondary | ICD-10-CM | POA: Diagnosis not present

## 2020-08-25 DIAGNOSIS — Z6830 Body mass index (BMI) 30.0-30.9, adult: Secondary | ICD-10-CM | POA: Diagnosis not present

## 2020-08-25 DIAGNOSIS — E1122 Type 2 diabetes mellitus with diabetic chronic kidney disease: Secondary | ICD-10-CM | POA: Diagnosis not present

## 2020-08-25 DIAGNOSIS — N183 Chronic kidney disease, stage 3 unspecified: Secondary | ICD-10-CM | POA: Diagnosis not present

## 2020-08-25 DIAGNOSIS — E1165 Type 2 diabetes mellitus with hyperglycemia: Secondary | ICD-10-CM | POA: Diagnosis not present

## 2020-08-25 DIAGNOSIS — Z87891 Personal history of nicotine dependence: Secondary | ICD-10-CM | POA: Diagnosis not present

## 2020-08-25 DIAGNOSIS — Z299 Encounter for prophylactic measures, unspecified: Secondary | ICD-10-CM | POA: Diagnosis not present

## 2020-09-28 ENCOUNTER — Ambulatory Visit (INDEPENDENT_AMBULATORY_CARE_PROVIDER_SITE_OTHER): Payer: PPO | Admitting: Gastroenterology

## 2020-10-30 DIAGNOSIS — Z1331 Encounter for screening for depression: Secondary | ICD-10-CM | POA: Diagnosis not present

## 2020-10-30 DIAGNOSIS — Z125 Encounter for screening for malignant neoplasm of prostate: Secondary | ICD-10-CM | POA: Diagnosis not present

## 2020-10-30 DIAGNOSIS — Z1339 Encounter for screening examination for other mental health and behavioral disorders: Secondary | ICD-10-CM | POA: Diagnosis not present

## 2020-10-30 DIAGNOSIS — Z6830 Body mass index (BMI) 30.0-30.9, adult: Secondary | ICD-10-CM | POA: Diagnosis not present

## 2020-10-30 DIAGNOSIS — I10 Essential (primary) hypertension: Secondary | ICD-10-CM | POA: Diagnosis not present

## 2020-10-30 DIAGNOSIS — Z299 Encounter for prophylactic measures, unspecified: Secondary | ICD-10-CM | POA: Diagnosis not present

## 2020-10-30 DIAGNOSIS — Z0000 Encounter for general adult medical examination without abnormal findings: Secondary | ICD-10-CM | POA: Diagnosis not present

## 2020-10-30 DIAGNOSIS — R5383 Other fatigue: Secondary | ICD-10-CM | POA: Diagnosis not present

## 2020-10-30 DIAGNOSIS — Z7189 Other specified counseling: Secondary | ICD-10-CM | POA: Diagnosis not present

## 2020-10-30 DIAGNOSIS — Z79899 Other long term (current) drug therapy: Secondary | ICD-10-CM | POA: Diagnosis not present

## 2020-10-30 DIAGNOSIS — E7800 Pure hypercholesterolemia, unspecified: Secondary | ICD-10-CM | POA: Diagnosis not present

## 2020-11-21 IMAGING — DX DG RIBS W/ CHEST 3+V*R*
4 series · 4 of 4 positions shown · non-contrast
Comparison: 05/15/2020

CLINICAL DATA: Right lower anterior rib pain over the last 4
months. No known injury.

EXAM:
RIGHT RIBS AND CHEST - 3+ VIEW

[chest pa]
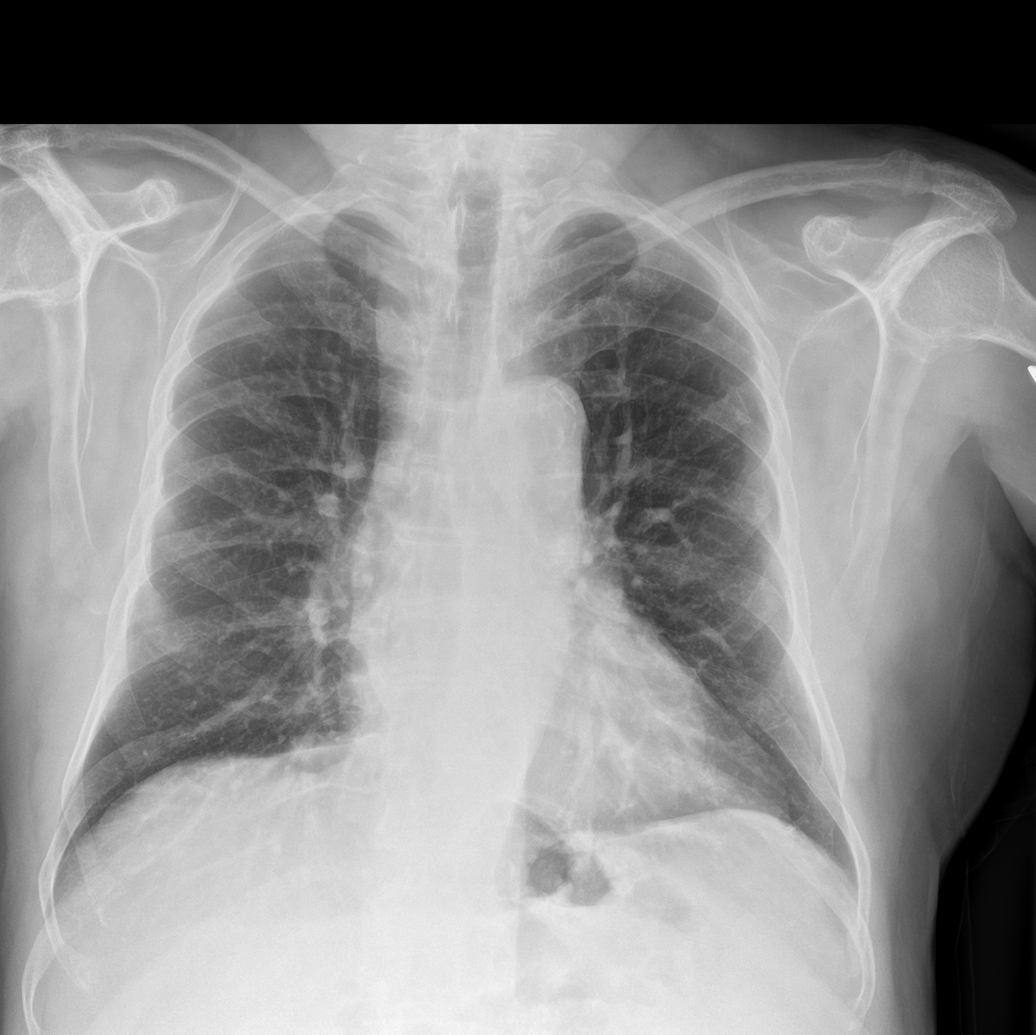

[rib pa]
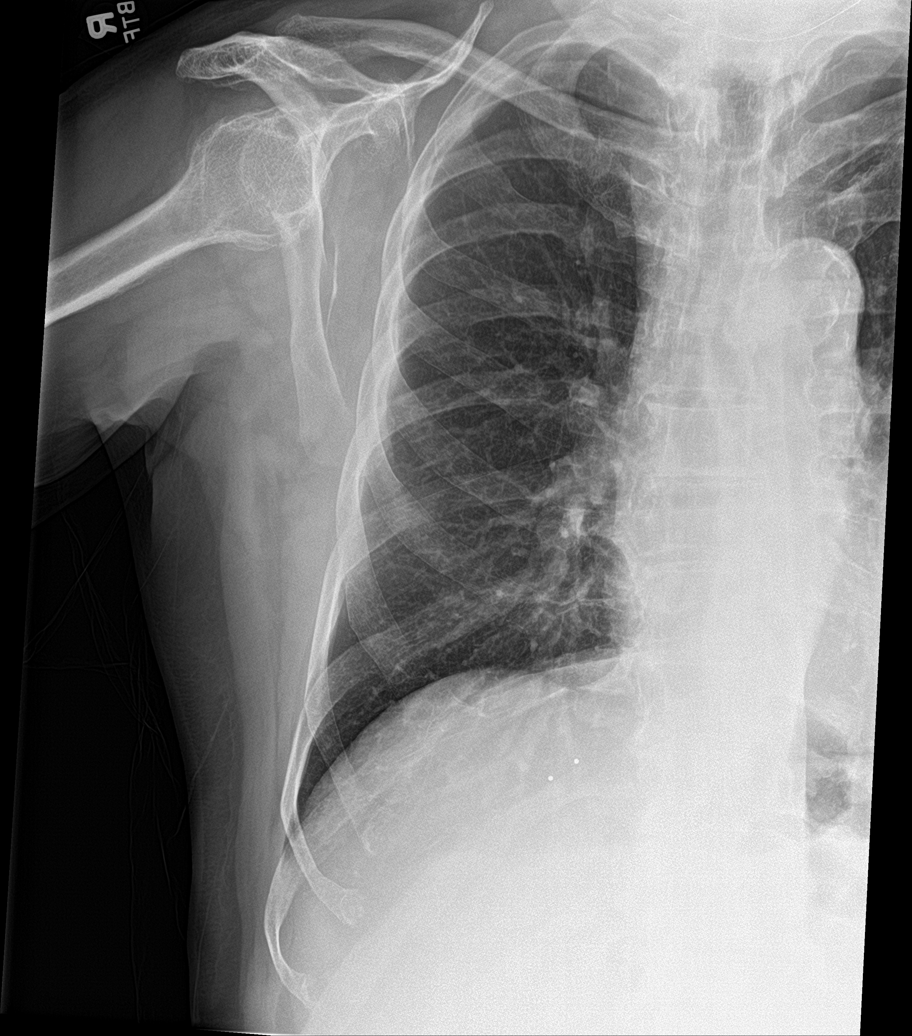

[rib pa obl (1 of 2)]
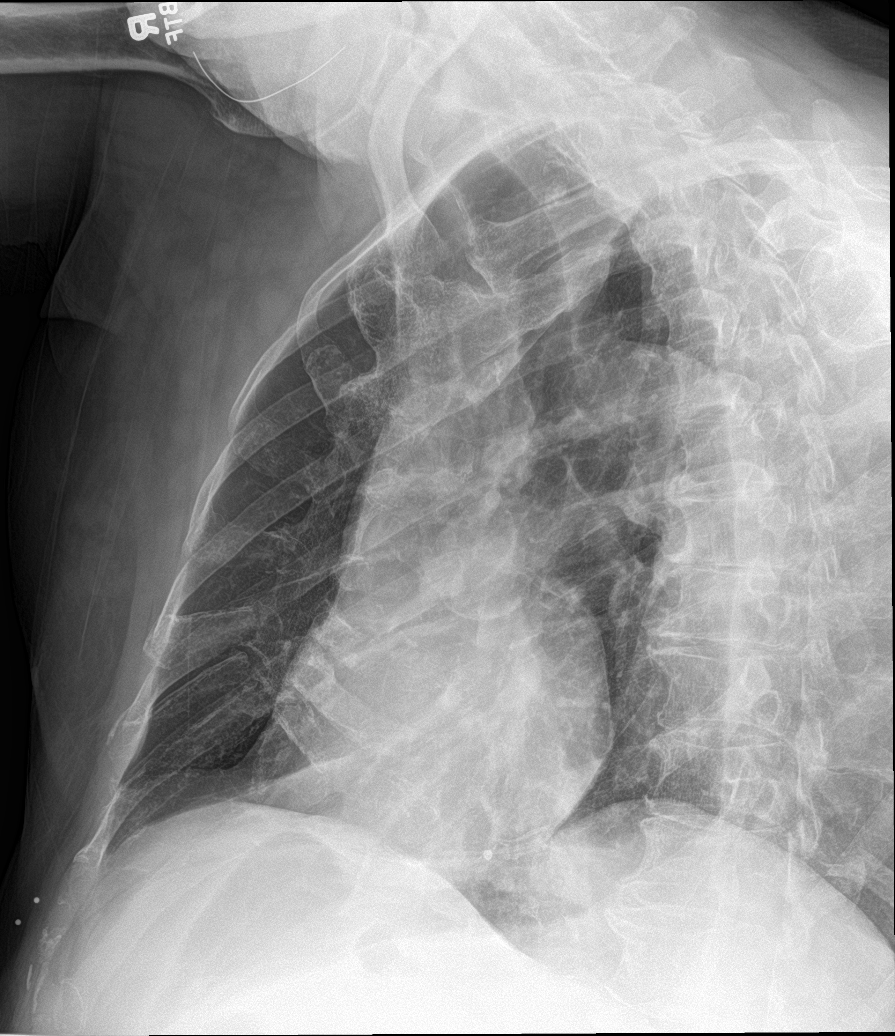

[rib pa obl (2 of 2)]
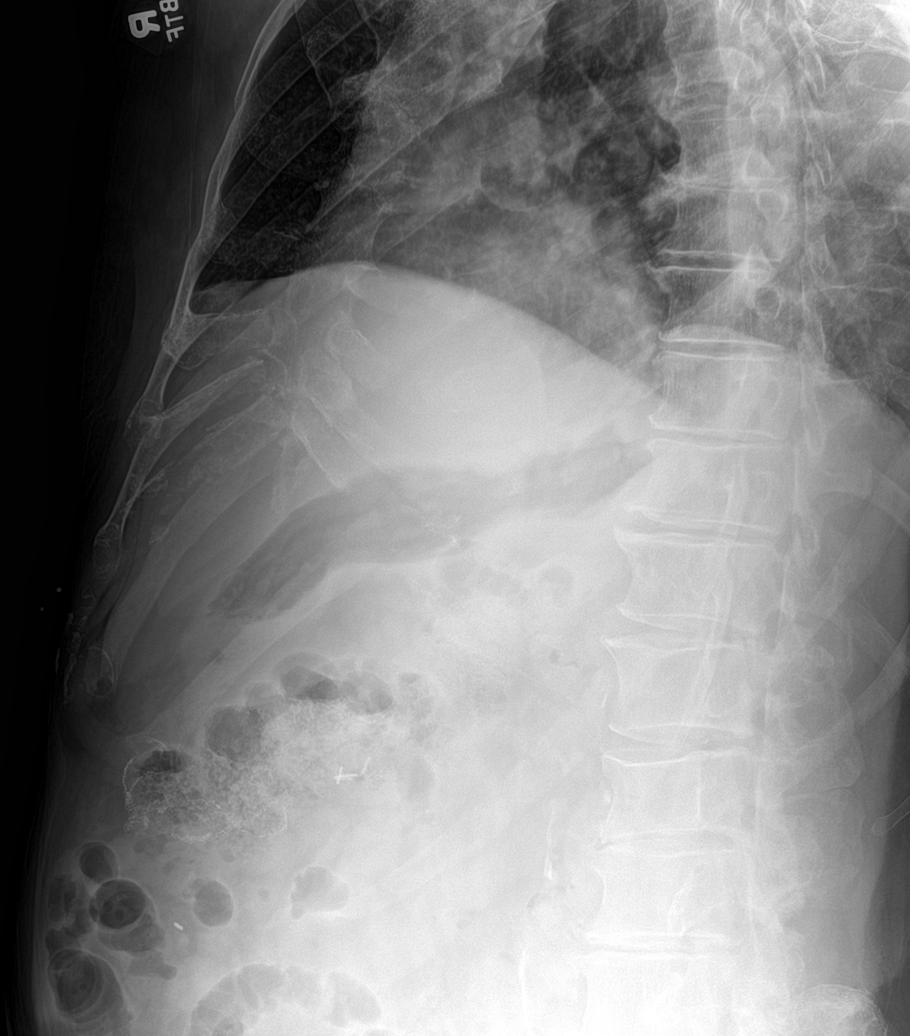

[4 of 4 positions shown; findings below may reference images not displayed]

FINDINGS: Heart size upper limits of normal. Aortic atherosclerotic
calcification and tortuosity. Slightly prominent chronic
interstitial lung markings without evidence of active infiltrate,
mass, effusion or collapse. No visible right rib fracture. Skin
markers in the anterior right lower rib region.
IMPRESSION: No active disease. Chronic interstitial lung markings. No visible
rib fracture.

## 2020-12-03 DIAGNOSIS — Z299 Encounter for prophylactic measures, unspecified: Secondary | ICD-10-CM | POA: Diagnosis not present

## 2020-12-03 DIAGNOSIS — N183 Chronic kidney disease, stage 3 unspecified: Secondary | ICD-10-CM | POA: Diagnosis not present

## 2020-12-03 DIAGNOSIS — E1122 Type 2 diabetes mellitus with diabetic chronic kidney disease: Secondary | ICD-10-CM | POA: Diagnosis not present

## 2020-12-03 DIAGNOSIS — E1165 Type 2 diabetes mellitus with hyperglycemia: Secondary | ICD-10-CM | POA: Diagnosis not present

## 2020-12-03 DIAGNOSIS — I10 Essential (primary) hypertension: Secondary | ICD-10-CM | POA: Diagnosis not present

## 2020-12-03 DIAGNOSIS — Z6830 Body mass index (BMI) 30.0-30.9, adult: Secondary | ICD-10-CM | POA: Diagnosis not present

## 2020-12-09 ENCOUNTER — Other Ambulatory Visit (HOSPITAL_COMMUNITY): Payer: PPO

## 2020-12-15 ENCOUNTER — Inpatient Hospital Stay (HOSPITAL_COMMUNITY): Payer: PPO | Attending: Hematology

## 2020-12-15 ENCOUNTER — Other Ambulatory Visit: Payer: Self-pay

## 2020-12-15 DIAGNOSIS — D631 Anemia in chronic kidney disease: Secondary | ICD-10-CM | POA: Insufficient documentation

## 2020-12-15 DIAGNOSIS — N189 Chronic kidney disease, unspecified: Secondary | ICD-10-CM | POA: Diagnosis not present

## 2020-12-15 DIAGNOSIS — Z87891 Personal history of nicotine dependence: Secondary | ICD-10-CM | POA: Diagnosis not present

## 2020-12-15 DIAGNOSIS — Z96653 Presence of artificial knee joint, bilateral: Secondary | ICD-10-CM | POA: Diagnosis not present

## 2020-12-15 DIAGNOSIS — R591 Generalized enlarged lymph nodes: Secondary | ICD-10-CM | POA: Insufficient documentation

## 2020-12-15 DIAGNOSIS — Z808 Family history of malignant neoplasm of other organs or systems: Secondary | ICD-10-CM | POA: Diagnosis not present

## 2020-12-15 DIAGNOSIS — Z809 Family history of malignant neoplasm, unspecified: Secondary | ICD-10-CM | POA: Insufficient documentation

## 2020-12-15 LAB — CBC WITH DIFFERENTIAL/PLATELET
Abs Immature Granulocytes: 0.04 10*3/uL (ref 0.00–0.07)
Basophils Absolute: 0.1 10*3/uL (ref 0.0–0.1)
Basophils Relative: 1 %
Eosinophils Absolute: 0.7 10*3/uL — ABNORMAL HIGH (ref 0.0–0.5)
Eosinophils Relative: 8 %
HCT: 39.8 % (ref 39.0–52.0)
Hemoglobin: 13.7 g/dL (ref 13.0–17.0)
Immature Granulocytes: 1 %
Lymphocytes Relative: 36 %
Lymphs Abs: 3 10*3/uL (ref 0.7–4.0)
MCH: 30.4 pg (ref 26.0–34.0)
MCHC: 34.4 g/dL (ref 30.0–36.0)
MCV: 88.2 fL (ref 80.0–100.0)
Monocytes Absolute: 0.6 10*3/uL (ref 0.1–1.0)
Monocytes Relative: 7 %
Neutro Abs: 3.9 10*3/uL (ref 1.7–7.7)
Neutrophils Relative %: 47 %
Platelets: 154 10*3/uL (ref 150–400)
RBC: 4.51 MIL/uL (ref 4.22–5.81)
RDW: 13.2 % (ref 11.5–15.5)
WBC: 8.2 10*3/uL (ref 4.0–10.5)
nRBC: 0 % (ref 0.0–0.2)

## 2020-12-15 LAB — COMPREHENSIVE METABOLIC PANEL
ALT: 30 U/L (ref 0–44)
AST: 21 U/L (ref 15–41)
Albumin: 3.5 g/dL (ref 3.5–5.0)
Alkaline Phosphatase: 50 U/L (ref 38–126)
Anion gap: 7 (ref 5–15)
BUN: 37 mg/dL — ABNORMAL HIGH (ref 8–23)
CO2: 24 mmol/L (ref 22–32)
Calcium: 8.8 mg/dL — ABNORMAL LOW (ref 8.9–10.3)
Chloride: 107 mmol/L (ref 98–111)
Creatinine, Ser: 1.38 mg/dL — ABNORMAL HIGH (ref 0.61–1.24)
GFR, Estimated: 54 mL/min — ABNORMAL LOW (ref >60)
Glucose, Bld: 128 mg/dL — ABNORMAL HIGH (ref 70–99)
Potassium: 4.2 mmol/L (ref 3.5–5.1)
Sodium: 138 mmol/L (ref 135–145)
Total Bilirubin: 1 mg/dL (ref 0.3–1.2)
Total Protein: 7.1 g/dL (ref 6.5–8.1)

## 2020-12-15 LAB — VITAMIN B12: Vitamin B-12: 457 pg/mL (ref 180–914)

## 2020-12-15 LAB — IRON AND TIBC
Iron: 93 ug/dL (ref 45–182)
Saturation Ratios: 29 % (ref 17.9–39.5)
TIBC: 323 ug/dL (ref 250–450)
UIBC: 230 ug/dL

## 2020-12-15 LAB — FERRITIN: Ferritin: 51 ng/mL (ref 24–336)

## 2020-12-15 LAB — LACTATE DEHYDROGENASE: LDH: 168 U/L (ref 98–192)

## 2020-12-15 LAB — VITAMIN D 25 HYDROXY (VIT D DEFICIENCY, FRACTURES): Vit D, 25-Hydroxy: 59.47 ng/mL (ref 30–100)

## 2020-12-16 ENCOUNTER — Ambulatory Visit (HOSPITAL_COMMUNITY): Payer: PPO | Admitting: Hematology

## 2020-12-16 LAB — FOLATE: Folate: 30.5 ng/mL (ref >5.9)

## 2020-12-21 NOTE — Progress Notes (Signed)
[place] [place], [place] [address]   CLINIC:  Medical Oncology/Hematology  PCP:  Monico Blitz, [place] [address] [phone]   REASON FOR VISIT:  Follow-up for generalized lymphadenopathy  PRIOR THERAPY: none  NGS Results: not done  CURRENT THERAPY: observation  BRIEF ONCOLOGIC HISTORY:  Oncology History   No history exists.    CANCER STAGING: Cancer Staging No matching staging information was found for the patient.  INTERVAL HISTORY:  Kevin Hooper, a 75 y.o. male, returns for routine follow-up of his  generalized lymphadenopathy. Kevin Hooper was last seen on 06/16/2020.   He reports feeling good today. He denies any fevers, chills or night sweats. He has not taken antibiotics recently. He denies blood in urine or stools as well as black stools.    REVIEW OF SYSTEMS:  Review of Systems  Constitutional: Positive for appetite change (75%) and fatigue (50%). Negative for chills and fever.  Gastrointestinal: Negative for blood in stool.  Genitourinary: Negative for hematuria.   All other systems reviewed and are negative.   PAST MEDICAL/SURGICAL HISTORY:  Past Medical History:  Diagnosis Date  . Arthritis   . Cataracts, bilateral 11/2016  . Hyperlipidemia   . Hypertension   . Joint ache   . Prostate atrophy 2018   Past Surgical History:  Procedure Laterality Date  . COLON RESECTION  2015  . HERNIA REPAIR    . LUMBAR FUSION  1990  . OTHER SURGICAL HISTORY Left    Arm s/p accident  . REPLACEMENT TOTAL KNEE BILATERAL Bilateral 1990  . TRANSURETHRAL RESECTION OF PROSTATE N/A 07/04/2017   Procedure: TRANSURETHRAL RESECTION OF THE PROSTATE (TURP);  Surgeon: Irine Seal, MD;  Location: [facility];  Service: Urology;  Laterality: N/A;    SOCIAL HISTORY:  Social History   Socioeconomic History  . Marital status: Married    Spouse name: Not on file  . Number of children: 2  . Years of education: 12+  . Highest  education level: Not on file  Occupational History  . Occupation: Retired  Tobacco Use  . Smoking status: Former Smoker    Quit date: 04/28/1992    Years since quitting: 28.6  . Smokeless tobacco: Never Used  Vaping Use  . Vaping Use: Never used  Substance and Sexual Activity  . Alcohol use: Yes    Comment: Occasional  . Drug use: No  . Sexual activity: Not on file    Comment: Married  Other Topics Concern  . Not on file  Social History Narrative   Lives at home w/ his wife   Right-handed   Caffeine: occasional tea   Social Determinants of Health   Financial Resource Strain: Low Risk   . Difficulty of Paying Living Expenses: Not hard at all  Food Insecurity: No Food Insecurity  . Worried About Charity fundraiser in the Last Year: Never true  . Ran Out of Food in the Last Year: Never true  Transportation Needs: No Transportation Needs  . Lack of Transportation (Medical): No  . Lack of Transportation (Non-Medical): No  Physical Activity: Insufficiently Active  . Days of Exercise per Week: 7 days  . Minutes of Exercise per Session: 20 min  Stress: No Stress Concern Present  . Feeling of Stress : Not at all  Social Connections: Moderately Integrated  . Frequency of Communication with Friends and Family: More than three times a week  . Frequency of Social Gatherings with  Friends and Family: Twice a week  . Attends Religious Services: More than 4 times per year  . Active Member of Clubs or Organizations: No  . Attends Archivist Meetings: Never  . Marital Status: Married  Human resources officer Violence: Not At Risk  . Fear of Current or Ex-Partner: No  . Emotionally Abused: No  . Physically Abused: No  . Sexually Abused: No    FAMILY HISTORY:  Family History  Problem Relation Age of Onset  . COPD Father   . Throat cancer Father   . Cancer Brother     CURRENT MEDICATIONS:  Current Outpatient Medications  Medication Sig Dispense Refill  . albuterol  (PROVENTIL HFA;VENTOLIN HFA) 108 (90 Base) MCG/ACT inhaler Inhale 2 puffs into the lungs every 6 (six) hours as needed for wheezing or shortness of breath.     Marland Kitchen amLODipine (NORVASC) 5 MG tablet Take 5 mg by mouth daily.    . Blood Glucose Monitoring Suppl ([place]) w/Device KIT daily.    . Cholecalciferol (VITAMIN D3) 125 MCG (5000 UT) CAPS Take by mouth daily.    Marland Kitchen glucosamine-chondroitin 500-400 MG tablet Take 1 tablet by mouth daily.    Marland Kitchen lisinopril (PRINIVIL,ZESTRIL) 10 MG tablet Take 10 mg by mouth every evening.   2  . metoprolol succinate (TOPROL-XL) 100 MG 24 hr tablet Take 100 mg by mouth daily.   1  . Multiple Vitamin (MULTIVITAMIN WITH MINERALS) TABS tablet Take 1 tablet by mouth daily.    . naproxen sodium (ALEVE) 220 MG tablet Take 220 mg by mouth daily as needed.    . Omega-3 Fatty Acids (FISH OIL) 1000 MG CAPS Take by mouth daily.    Glory Rosebush VERIO test strip 1 each daily.    . vitamin C (ASCORBIC ACID) 500 MG tablet Take 500 mg by mouth daily.     No current facility-administered medications for this visit.    ALLERGIES:  No Known Allergies  PHYSICAL EXAM:  Performance status (ECOG): 1 - Symptomatic but completely ambulatory  There were no vitals filed for this visit. Wt Readings from Last 3 Encounters:  06/16/20 184 lb (83.5 kg)  12/17/19 190 lb (86.2 kg)  06/18/19 188 lb (85.3 kg)   Physical Exam Vitals reviewed.  Constitutional:      Appearance: Normal appearance.  Cardiovascular:     Rate and Rhythm: Normal rate and regular rhythm.     Pulses: Normal pulses.     Heart sounds: Normal heart sounds.  Pulmonary:     Effort: Pulmonary effort is normal.     Breath sounds: Normal breath sounds.  Chest:  Breasts:     Right: No axillary adenopathy.     Left: No axillary adenopathy.    Abdominal:     Palpations: Abdomen is soft. There is no hepatomegaly, splenomegaly or mass.     Tenderness: There is no abdominal tenderness.   Musculoskeletal:     Right lower leg: No edema.     Left lower leg: No edema.  Lymphadenopathy:     Upper Body:     Right upper body: No axillary or pectoral adenopathy.     Left upper body: No axillary or pectoral adenopathy.     Lower Body: No right inguinal adenopathy. No left inguinal adenopathy.  Neurological:     General: No focal deficit present.     Mental Status: He is alert and oriented to person, place, and time.  Psychiatric:  Mood and Affect: Mood normal.        Behavior: Behavior normal.      LABORATORY DATA:  I have reviewed the labs as listed.  CBC Latest Ref Rng & Units 12/15/2020 06/09/2020 12/10/2019  WBC 4.0 - 10.5 K/uL 8.2 7.0 7.2  Hemoglobin 13.0 - 17.0 g/dL 13.7 12.6(L) 12.8(L)  Hematocrit 39.0 - 52.0 % 39.8 36.5(L) 37.5(L)  Platelets 150 - 400 K/uL 154 163 196   CMP Latest Ref Rng & Units 12/15/2020 06/09/2020 12/10/2019  Glucose 70 - 99 mg/dL 128(H) 160(H) 200(H)  BUN 8 - 23 mg/dL 37(H) 34(H) 28(H)  Creatinine 0.61 - 1.24 mg/dL 1.38(H) 1.49(H) 1.35(H)  Sodium 135 - 145 mmol/L 138 140 138  Potassium 3.5 - 5.1 mmol/L 4.2 4.4 4.2  Chloride 98 - 111 mmol/L 107 109 105  CO2 22 - 32 mmol/L _0 Calcium 8.9 - 10.3 mg/dL 8.8(L) 8.5(L) 8.8(L)  Total Protein 6.5 - 8.1 g/dL 7.1 6.9 7.2  Total Bilirubin 0.3 - 1.2 mg/dL 1.0 0.7 1.0  Alkaline Phos 38 - 126 U/L 50 46 55  AST 15 - 41 U/L _1 ALT 0 - 44 U/L _2 DIAGNOSTIC IMAGING:  I have independently reviewed the scans and discussed with the patient. No results found.   ASSESSMENT:  1. Highly likely low-grade lymphoma: -Recent CT for hematuria work-up on 04/05/2018 showed several retroperitoneal lymph nodes measuring 1.2 cm and pelvic lymph nodes bilaterally, largest in the left obturator region measuring 2.2 cm. -Patient does not have any B symptoms including fevers, night sweats or weight loss. -CT scan from 2010 done at [facility] also showed lymphadenopathy and  splenomegaly.  -PET/CT scan on 05/28/2018 showed very low metabolic activity associated with enlarged pelvic and retroperitoneal and axillary nodes. Spleen and bone marrow are normal. We discussed the normal progression of low-grade lymphomas in detail. - Ultrasound of the abdomen from 05/30/2020 from [facility] showed normal liver.  Splenomegaly measuring 13.4 x 7.8 x 15.4 cm.   PLAN:  1. Highly likely low-grade lymphoma: -He does not report any B symptoms.  No recurrent infections. - Physical examination today did not reveal any palpable lymphadenopathy or splenomegaly. - Reviewed labs today which showed normal white count and platelet count. -No indication for further work-up or treatment at this time. - RTC 6 months for follow-up with repeat labs.  2.  Mild normocytic anemia: - This is from CKD and relative iron deficiency.  CKD stable around 1.3-1.4. -Reviewed labs from 12/15/2020.  Hemoglobin improved to 13.7.  Ferritin is 51 and percent saturation 29.  W09 and folic acid was normal. - We will repeat ferritin, iron panel in 6 months.    Orders placed this encounter:  No orders of the defined types were placed in this encounter.    Derek Jack, MD [place] [phone]   I, Thana Ates, am acting as a scribe for Dr. Derek Jack.  I, Derek Jack MD, have reviewed the above documentation for accuracy and completeness, and I agree with the above.

## 2020-12-22 ENCOUNTER — Other Ambulatory Visit: Payer: Self-pay

## 2020-12-22 ENCOUNTER — Inpatient Hospital Stay (HOSPITAL_COMMUNITY): Payer: PPO | Admitting: Hematology

## 2020-12-22 VITALS — BP 156/78 | HR 63 | Temp 98.2°F | Resp 20 | Wt 189.6 lb

## 2020-12-22 DIAGNOSIS — R591 Generalized enlarged lymph nodes: Secondary | ICD-10-CM | POA: Diagnosis not present

## 2020-12-22 NOTE — Patient Instructions (Signed)
[facility] Discharge Instructions  You were seen today by Dr. Katragadda. He went over your recent results. Dr. Katragadda will see you back in 6 months for labs and follow up.   Thank you for choosing [facility] to provide your oncology and hematology care.  To afford each patient quality time with our provider, please arrive at least 15 minutes before your scheduled appointment time.   If you have a lab appointment with the Cancer Center please come in thru the Main Entrance and check in at the main information desk  You need to re-schedule your appointment should you arrive 10 or more minutes late.  We strive to give you quality time with our providers, and arriving late affects you and other patients whose appointments are after yours.  Also, if you no show three or more times for appointments you may be dismissed from the clinic at the providers discretion.     Again, thank you for choosing [facility].  Our hope is that these requests will decrease the amount of time that you wait before being seen by our physicians.       _____________________________________________________________  Should you have questions after your visit to [facility], please contact our office at [phone] between the hours of 8:00 a.m. and 4:30 p.m.  Voicemails left after 4:00 p.m. will not be returned until the following business day.  For prescription refill requests, have your pharmacy contact our office and allow 72 hours.    Cancer Center Support Programs:   > Cancer Support Group  2nd Tuesday of the month 1pm-2pm, Journey Room    

## 2021-03-17 DIAGNOSIS — Z6830 Body mass index (BMI) 30.0-30.9, adult: Secondary | ICD-10-CM | POA: Diagnosis not present

## 2021-03-17 DIAGNOSIS — Z299 Encounter for prophylactic measures, unspecified: Secondary | ICD-10-CM | POA: Diagnosis not present

## 2021-03-17 DIAGNOSIS — E1122 Type 2 diabetes mellitus with diabetic chronic kidney disease: Secondary | ICD-10-CM | POA: Diagnosis not present

## 2021-03-17 DIAGNOSIS — I10 Essential (primary) hypertension: Secondary | ICD-10-CM | POA: Diagnosis not present

## 2021-03-17 DIAGNOSIS — N183 Chronic kidney disease, stage 3 unspecified: Secondary | ICD-10-CM | POA: Diagnosis not present

## 2021-03-17 DIAGNOSIS — E1165 Type 2 diabetes mellitus with hyperglycemia: Secondary | ICD-10-CM | POA: Diagnosis not present

## 2021-04-30 DIAGNOSIS — M1990 Unspecified osteoarthritis, unspecified site: Secondary | ICD-10-CM | POA: Diagnosis not present

## 2021-04-30 DIAGNOSIS — I10 Essential (primary) hypertension: Secondary | ICD-10-CM | POA: Diagnosis not present

## 2021-06-21 DIAGNOSIS — I10 Essential (primary) hypertension: Secondary | ICD-10-CM | POA: Diagnosis not present

## 2021-06-21 DIAGNOSIS — E1122 Type 2 diabetes mellitus with diabetic chronic kidney disease: Secondary | ICD-10-CM | POA: Diagnosis not present

## 2021-06-21 DIAGNOSIS — Z6830 Body mass index (BMI) 30.0-30.9, adult: Secondary | ICD-10-CM | POA: Diagnosis not present

## 2021-06-21 DIAGNOSIS — N183 Chronic kidney disease, stage 3 unspecified: Secondary | ICD-10-CM | POA: Diagnosis not present

## 2021-06-21 DIAGNOSIS — E1165 Type 2 diabetes mellitus with hyperglycemia: Secondary | ICD-10-CM | POA: Diagnosis not present

## 2021-06-21 DIAGNOSIS — Z299 Encounter for prophylactic measures, unspecified: Secondary | ICD-10-CM | POA: Diagnosis not present

## 2021-06-30 DIAGNOSIS — M1990 Unspecified osteoarthritis, unspecified site: Secondary | ICD-10-CM | POA: Diagnosis not present

## 2021-07-01 ENCOUNTER — Inpatient Hospital Stay (HOSPITAL_COMMUNITY): Payer: PPO | Attending: Hematology

## 2021-07-01 ENCOUNTER — Other Ambulatory Visit: Payer: Self-pay

## 2021-07-01 DIAGNOSIS — Z87891 Personal history of nicotine dependence: Secondary | ICD-10-CM | POA: Insufficient documentation

## 2021-07-01 DIAGNOSIS — D631 Anemia in chronic kidney disease: Secondary | ICD-10-CM | POA: Diagnosis not present

## 2021-07-01 DIAGNOSIS — N189 Chronic kidney disease, unspecified: Secondary | ICD-10-CM | POA: Insufficient documentation

## 2021-07-01 DIAGNOSIS — R591 Generalized enlarged lymph nodes: Secondary | ICD-10-CM | POA: Insufficient documentation

## 2021-07-01 LAB — CBC WITH DIFFERENTIAL/PLATELET
Abs Immature Granulocytes: 0.05 10*3/uL (ref 0.00–0.07)
Basophils Absolute: 0.1 10*3/uL (ref 0.0–0.1)
Basophils Relative: 1 %
Eosinophils Absolute: 0.7 10*3/uL — ABNORMAL HIGH (ref 0.0–0.5)
Eosinophils Relative: 10 %
HCT: 37.5 % — ABNORMAL LOW (ref 39.0–52.0)
Hemoglobin: 13.6 g/dL (ref 13.0–17.0)
Immature Granulocytes: 1 %
Lymphocytes Relative: 52 %
Lymphs Abs: 3.9 10*3/uL (ref 0.7–4.0)
MCH: 32.2 pg (ref 26.0–34.0)
MCHC: 36.3 g/dL — ABNORMAL HIGH (ref 30.0–36.0)
MCV: 88.9 fL (ref 80.0–100.0)
Monocytes Absolute: 1 10*3/uL (ref 0.1–1.0)
Monocytes Relative: 12 %
Neutro Abs: 1.9 10*3/uL (ref 1.7–7.7)
Neutrophils Relative %: 24 %
Platelets: 121 10*3/uL — ABNORMAL LOW (ref 150–400)
RBC: 4.22 MIL/uL (ref 4.22–5.81)
RDW: 13.1 % (ref 11.5–15.5)
WBC: 7.6 10*3/uL (ref 4.0–10.5)
nRBC: 0 % (ref 0.0–0.2)

## 2021-07-01 LAB — IRON AND TIBC
Iron: 75 ug/dL (ref 45–182)
Saturation Ratios: 24 % (ref 17.9–39.5)
TIBC: 315 ug/dL (ref 250–450)
UIBC: 240 ug/dL

## 2021-07-01 LAB — COMPREHENSIVE METABOLIC PANEL
ALT: 19 U/L (ref 0–44)
AST: 17 U/L (ref 15–41)
Albumin: 3.6 g/dL (ref 3.5–5.0)
Alkaline Phosphatase: 47 U/L (ref 38–126)
Anion gap: 7 (ref 5–15)
BUN: 47 mg/dL — ABNORMAL HIGH (ref 8–23)
CO2: 26 mmol/L (ref 22–32)
Calcium: 9 mg/dL (ref 8.9–10.3)
Chloride: 108 mmol/L (ref 98–111)
Creatinine, Ser: 1.79 mg/dL — ABNORMAL HIGH (ref 0.61–1.24)
GFR, Estimated: 40 mL/min — ABNORMAL LOW (ref >60)
Glucose, Bld: 121 mg/dL — ABNORMAL HIGH (ref 70–99)
Potassium: 4.4 mmol/L (ref 3.5–5.1)
Sodium: 141 mmol/L (ref 135–145)
Total Bilirubin: 0.7 mg/dL (ref 0.3–1.2)
Total Protein: 7.2 g/dL (ref 6.5–8.1)

## 2021-07-01 LAB — FERRITIN: Ferritin: 58 ng/mL (ref 24–336)

## 2021-07-06 NOTE — Progress Notes (Signed)
Kevin Hooper, [place] [address]   CLINIC:  Medical Oncology/Hematology  PCP:  Monico Blitz, [place] / [place] [address] [phone]   REASON FOR VISIT:  Follow-up for generalized lymphadenopathy  PRIOR THERAPY: none  NGS Results: not done  CURRENT THERAPY: surveillance  BRIEF ONCOLOGIC HISTORY:  Oncology History   No history exists.    CANCER STAGING:  Cancer Staging  No matching staging information was found for the patient.  INTERVAL HISTORY:  [name], a 75 y.o. male, returns for routine follow-up of his generalized lymphadenopathy. Kage was last seen on 12/22/2020.   Today he reports feeling good. He denies recent fevers, infections, night sweats, hematuria, lumps, hematochezia, and weight loss. He takes aleve daily for pain from arthritis.   REVIEW OF SYSTEMS:  Review of Systems  Constitutional:  Negative for appetite change (75%), fatigue (50%), fever and unexpected weight change.  HENT:   Negative for lump/mass.   Gastrointestinal:  Negative for blood in stool.  Genitourinary:  Negative for hematuria.   All other systems reviewed and are negative.  PAST MEDICAL/SURGICAL HISTORY:  Past Medical History:  Diagnosis Date   Arthritis    Cataracts, bilateral 11/2016   Hyperlipidemia    Hypertension    Joint ache    Prostate atrophy 2018   Past Surgical History:  Procedure Laterality Date   COLON RESECTION  2015   HERNIA REPAIR     LUMBAR FUSION  1990   OTHER SURGICAL HISTORY Left    Arm s/p accident   REPLACEMENT TOTAL KNEE BILATERAL Bilateral 1990   TRANSURETHRAL RESECTION OF PROSTATE N/A 07/04/2017   Procedure: TRANSURETHRAL RESECTION OF THE PROSTATE (TURP);  Surgeon: Irine Seal, MD;  Location: [facility];  Service: Urology;  Laterality: N/A;    SOCIAL HISTORY:  Social History   Socioeconomic History   Marital status: Married    Spouse name: Not on file   Number of children: 2   Years of  education: 12+   Highest education level: Not on file  Occupational History   Occupation: Retired  Tobacco Use   Smoking status: Former    Types: Cigarettes    Quit date: 04/28/1992    Years since quitting: 29.2   Smokeless tobacco: Never  Vaping Use   Vaping Use: Never used  Substance and Sexual Activity   Alcohol use: Yes    Comment: Occasional   Drug use: No   Sexual activity: Not on file    Comment: Married  Other Topics Concern   Not on file  Social History Narrative   Lives at home w/ his wife   Right-handed   Caffeine: occasional tea   Social Determinants of Radio broadcast assistant Strain: Not on file  Food Insecurity: Not on file  Transportation Needs: Not on file  Physical Activity: Not on file  Stress: Not on file  Social Connections: Not on file  Intimate Partner Violence: Not on file    FAMILY HISTORY:  Family History  Problem Relation Age of Onset   COPD Father    Throat cancer Father    Cancer Brother     CURRENT MEDICATIONS:  Current Outpatient Medications  Medication Sig Dispense Refill   albuterol (PROVENTIL HFA;VENTOLIN HFA) 108 (90 Base) MCG/ACT inhaler Inhale 2 puffs into the lungs every 6 (six) hours as needed for wheezing or shortness of breath.      amLODipine (NORVASC) 5 MG tablet Take 5  mg by mouth daily.     Blood Glucose Monitoring Suppl ([place] FLEX SYSTEM) w/Device KIT daily.     Cholecalciferol (VITAMIN D3) 125 MCG (5000 UT) CAPS Take by mouth daily.     glucosamine-chondroitin 500-400 MG tablet Take 1 tablet by mouth daily.     metoprolol succinate (TOPROL-XL) 100 MG 24 hr tablet Take 100 mg by mouth daily.   1   naproxen sodium (ALEVE) 220 MG tablet Take 220 mg by mouth daily as needed.     ONETOUCH VERIO test strip 1 each daily.     rosuvastatin (CRESTOR) 5 MG tablet Take 5 mg by mouth at bedtime.     vitamin C (ASCORBIC ACID) 500 MG tablet Take 1,000 mg by mouth daily.     lisinopril (PRINIVIL,ZESTRIL) 10 MG tablet  Take 10 mg by mouth every evening.   2   Multiple Vitamin (MULTIVITAMIN WITH MINERALS) TABS tablet Take 1 tablet by mouth daily.     Omega-3 Fatty Acids (FISH OIL) 1000 MG CAPS Take by mouth daily.     No current facility-administered medications for this visit.    ALLERGIES:  No Known Allergies  PHYSICAL EXAM:  Performance status (ECOG): 1 - Symptomatic but completely ambulatory  Vitals:   07/07/21 1410  BP: 134/81  Pulse: 64  Resp: 18  Temp: 97.9 F (36.6 C)  SpO2: 95%   Wt Readings from Last 3 Encounters:  07/07/21 188 lb 7.9 oz (85.5 kg)  12/22/20 189 lb 9.5 oz (86 kg)  06/16/20 184 lb (83.5 kg)   Physical Exam Vitals reviewed.  Constitutional:      Appearance: Normal appearance.  Cardiovascular:     Rate and Rhythm: Normal rate and regular rhythm.     Pulses: Normal pulses.     Heart sounds: Normal heart sounds.  Pulmonary:     Effort: Pulmonary effort is normal.     Breath sounds: Normal breath sounds.  Abdominal:     Palpations: Abdomen is soft. There is no hepatomegaly, splenomegaly or mass.     Tenderness: There is no abdominal tenderness.  Musculoskeletal:     Right lower leg: No edema.     Left lower leg: No edema.  Lymphadenopathy:     Cervical: No cervical adenopathy.     Right cervical: No superficial, deep or posterior cervical adenopathy.    Left cervical: No superficial, deep or posterior cervical adenopathy.     Upper Body:     Right upper body: No supraclavicular, axillary or pectoral adenopathy.     Left upper body: No supraclavicular or axillary adenopathy.     Lower Body: No right inguinal adenopathy. No left inguinal adenopathy.  Neurological:     General: No focal deficit present.     Mental Status: He is alert and oriented to person, place, and time.  Psychiatric:        Mood and Affect: Mood normal.        Behavior: Behavior normal.     LABORATORY DATA:  I have reviewed the labs as listed.  CBC Latest Ref Rng & Units 07/01/2021  12/15/2020 06/09/2020  WBC 4.0 - 10.5 K/uL 7.6 8.2 7.0  Hemoglobin 13.0 - 17.0 g/dL 13.6 13.7 12.6(L)  Hematocrit 39.0 - 52.0 % 37.5(L) 39.8 36.5(L)  Platelets 150 - 400 K/uL 121(L) 154 163   CMP Latest Ref Rng & Units 07/01/2021 12/15/2020 06/09/2020  Glucose 70 - 99 mg/dL 121(H) 128(H) 160(H)  BUN 8 - 23 mg/dL 47(H) 37(H)  34(H)  Creatinine 0.61 - 1.24 mg/dL 1.79(H) 1.38(H) 1.49(H)  Sodium 135 - 145 mmol/L 141 138 140  Potassium 3.5 - 5.1 mmol/L 4.4 4.2 4.4  Chloride 98 - 111 mmol/L 108 107 109  CO2 22 - 32 mmol/L _0 Calcium 8.9 - 10.3 mg/dL 9.0 8.8(L) 8.5(L)  Total Protein 6.5 - 8.1 g/dL 7.2 7.1 6.9  Total Bilirubin 0.3 - 1.2 mg/dL 0.7 1.0 0.7  Alkaline Phos 38 - 126 U/L 47 50 46  AST 15 - 41 U/L _1 ALT 0 - 44 U/L _2 DIAGNOSTIC IMAGING:  I have independently reviewed the scans and discussed with the patient. No results found.   ASSESSMENT:  1.  Highly likely low-grade lymphoma: - Recent CT for hematuria work-up on 04/05/2018 showed several retroperitoneal lymph nodes measuring 1.2 cm and pelvic lymph nodes bilaterally, largest in the left obturator region measuring 2.2 cm. -Patient does not have any B symptoms including fevers, night sweats or weight loss. - CT scan from 2010 [facility] also showed lymphadenopathy and splenomegaly.  - PET/CT scan on 05/28/2018 showed very low metabolic activity associated with enlarged pelvic and retroperitoneal and axillary nodes.  Spleen and bone marrow are normal.  We discussed the normal progression of low-grade lymphomas in detail. - Ultrasound of the abdomen from 05/30/2020 from [facility] showed normal liver.  Splenomegaly measuring 13.4 x 7.8 x 15.4 cm.   PLAN:  1.  Highly likely low-grade lymphoma: - He does not have any fevers, night sweats or weight loss in the last 6 months.  No recurrent infections. - Physical examination today did not reveal any palpable lymphadenopathy or  splenomegaly. - Reviewed labs which showed normal white count.  Platelet count is slightly low, likely from splenomegaly. - LFTs are normal. - No indication for further work-up or treatment.  RTC 6 months with repeat labs.   2.  Mild normocytic anemia: - This is from combination of CKD and relative iron deficiency. - Hemoglobin is stable at 13.6 with MCV 88.  Ferritin is 58 and percent saturation is 24. - However his creatinine has gone up to 1.79. - Recommend decreasing Aleve for arthritis.  Recommend increase water intake.  We will make referral to Dr. Theador Hawthorne.   Orders placed this encounter:  No orders of the defined types were placed in this encounter.    Derek Jack, MD [place] [phone]   I, Thana Ates, am acting as a scribe for Dr. Derek Jack.  I, Derek Jack MD, have reviewed the above documentation for accuracy and completeness, and I agree with the above.

## 2021-07-07 ENCOUNTER — Other Ambulatory Visit: Payer: Self-pay

## 2021-07-07 ENCOUNTER — Inpatient Hospital Stay (HOSPITAL_BASED_OUTPATIENT_CLINIC_OR_DEPARTMENT_OTHER): Payer: PPO | Admitting: Hematology

## 2021-07-07 VITALS — BP 134/81 | HR 64 | Temp 97.9°F | Resp 18 | Wt 188.5 lb

## 2021-07-07 DIAGNOSIS — R591 Generalized enlarged lymph nodes: Secondary | ICD-10-CM | POA: Diagnosis not present

## 2021-07-07 NOTE — Patient Instructions (Signed)
[place] at [facility] Discharge Instructions  You were seen and examined today by Dr. Delton Coombes. He reviewed your most recent labs and everything looks okay except your kidney function is elevated. Dr. Delton Coombes is referring you to Dr. Theador Hawthorne a kidney doctor for this. Do not take more than one NSAID (Aleve, Ibuprofen, anti-inflammatory drugs) daily. Please keep follow up as scheduled in 6 months.   Thank you for choosing [place] at [facility] to provide your oncology and hematology care.  To afford each patient quality time with our provider, please arrive at least 15 minutes before your scheduled appointment time.   If you have a lab appointment with the [place] please come in thru the Main Entrance and check in at the main information desk.  You need to re-schedule your appointment should you arrive 10 or more minutes late.  We strive to give you quality time with our providers, and arriving late affects you and other patients whose appointments are after yours.  Also, if you no show three or more times for appointments you may be dismissed from the clinic at the providers discretion.     Again, thank you for choosing [facility].  Our hope is that these requests will decrease the amount of time that you wait before being seen by our physicians.       _____________________________________________________________  Should you have questions after your visit to [facility], please contact our office at [phone] and follow the prompts.  Our office hours are 8:00 a.m. and 4:30 p.m. Monday - Friday.  Please note that voicemails left after 4:00 p.m. may not be returned until the following business day.  We are closed weekends and major holidays.  You do have access to a nurse 24-7, just call the main number to the clinic [phone] and do not press any options, hold on the line and a nurse will answer the phone.     For prescription refill requests, have your pharmacy contact our office and allow 72 hours.    Due to Covid, you will need to wear a mask upon entering the hospital. If you do not have a mask, a mask will be given to you at the Main Entrance upon arrival. For doctor visits, patients may have 1 support person age 26 or older with them. For treatment visits, patients can not have anyone with them due to social distancing guidelines and our immunocompromised population.

## 2021-07-08 ENCOUNTER — Encounter (HOSPITAL_COMMUNITY): Payer: Self-pay | Admitting: Lab

## 2021-07-08 NOTE — Progress Notes (Unsigned)
Referral sent to Dr Theador Hawthorne.  Records faxed on 12/8

## 2021-07-12 DIAGNOSIS — R600 Localized edema: Secondary | ICD-10-CM | POA: Diagnosis not present

## 2021-07-12 DIAGNOSIS — Z299 Encounter for prophylactic measures, unspecified: Secondary | ICD-10-CM | POA: Diagnosis not present

## 2021-07-12 DIAGNOSIS — M79673 Pain in unspecified foot: Secondary | ICD-10-CM | POA: Diagnosis not present

## 2021-07-12 DIAGNOSIS — I10 Essential (primary) hypertension: Secondary | ICD-10-CM | POA: Diagnosis not present

## 2021-07-12 DIAGNOSIS — E1165 Type 2 diabetes mellitus with hyperglycemia: Secondary | ICD-10-CM | POA: Diagnosis not present

## 2021-07-12 DIAGNOSIS — M79672 Pain in left foot: Secondary | ICD-10-CM | POA: Diagnosis not present

## 2021-07-30 DIAGNOSIS — M1990 Unspecified osteoarthritis, unspecified site: Secondary | ICD-10-CM | POA: Diagnosis not present

## 2021-08-04 ENCOUNTER — Other Ambulatory Visit (HOSPITAL_COMMUNITY): Payer: Self-pay | Admitting: Nephrology

## 2021-08-04 ENCOUNTER — Other Ambulatory Visit: Payer: Self-pay | Admitting: Nephrology

## 2021-08-04 DIAGNOSIS — N1832 Chronic kidney disease, stage 3b: Secondary | ICD-10-CM

## 2021-08-04 DIAGNOSIS — N200 Calculus of kidney: Secondary | ICD-10-CM | POA: Diagnosis not present

## 2021-08-04 DIAGNOSIS — D696 Thrombocytopenia, unspecified: Secondary | ICD-10-CM

## 2021-08-04 DIAGNOSIS — Z7189 Other specified counseling: Secondary | ICD-10-CM | POA: Diagnosis not present

## 2021-08-04 DIAGNOSIS — R809 Proteinuria, unspecified: Secondary | ICD-10-CM

## 2021-08-06 DIAGNOSIS — D696 Thrombocytopenia, unspecified: Secondary | ICD-10-CM | POA: Diagnosis not present

## 2021-08-06 DIAGNOSIS — R809 Proteinuria, unspecified: Secondary | ICD-10-CM | POA: Diagnosis not present

## 2021-08-06 DIAGNOSIS — Z79899 Other long term (current) drug therapy: Secondary | ICD-10-CM | POA: Diagnosis not present

## 2021-08-06 DIAGNOSIS — N200 Calculus of kidney: Secondary | ICD-10-CM | POA: Diagnosis not present

## 2021-08-06 DIAGNOSIS — N1832 Chronic kidney disease, stage 3b: Secondary | ICD-10-CM | POA: Diagnosis not present

## 2021-08-09 ENCOUNTER — Ambulatory Visit (HOSPITAL_COMMUNITY)
Admission: RE | Admit: 2021-08-09 | Discharge: 2021-08-09 | Disposition: A | Discharge status: Home / Self Care | Payer: PPO | Source: Ambulatory Visit | Attending: Nephrology | Admitting: Nephrology

## 2021-08-09 ENCOUNTER — Other Ambulatory Visit: Payer: Self-pay

## 2021-08-09 DIAGNOSIS — D696 Thrombocytopenia, unspecified: Secondary | ICD-10-CM | POA: Insufficient documentation

## 2021-08-09 DIAGNOSIS — N1832 Chronic kidney disease, stage 3b: Secondary | ICD-10-CM | POA: Insufficient documentation

## 2021-08-09 DIAGNOSIS — N281 Cyst of kidney, acquired: Secondary | ICD-10-CM | POA: Diagnosis not present

## 2021-08-09 DIAGNOSIS — R809 Proteinuria, unspecified: Secondary | ICD-10-CM | POA: Diagnosis not present

## 2021-08-09 DIAGNOSIS — N1330 Unspecified hydronephrosis: Secondary | ICD-10-CM | POA: Diagnosis not present

## 2021-08-16 DIAGNOSIS — L570 Actinic keratosis: Secondary | ICD-10-CM | POA: Diagnosis not present

## 2021-08-16 DIAGNOSIS — X32XXXD Exposure to sunlight, subsequent encounter: Secondary | ICD-10-CM | POA: Diagnosis not present

## 2021-08-16 DIAGNOSIS — L918 Other hypertrophic disorders of the skin: Secondary | ICD-10-CM | POA: Diagnosis not present

## 2021-09-03 DIAGNOSIS — R809 Proteinuria, unspecified: Secondary | ICD-10-CM | POA: Diagnosis not present

## 2021-09-03 DIAGNOSIS — E790 Hyperuricemia without signs of inflammatory arthritis and tophaceous disease: Secondary | ICD-10-CM | POA: Diagnosis not present

## 2021-09-03 DIAGNOSIS — N1339 Other hydronephrosis: Secondary | ICD-10-CM | POA: Diagnosis not present

## 2021-09-03 DIAGNOSIS — N1832 Chronic kidney disease, stage 3b: Secondary | ICD-10-CM | POA: Diagnosis not present

## 2021-09-03 DIAGNOSIS — N170 Acute kidney failure with tubular necrosis: Secondary | ICD-10-CM | POA: Diagnosis not present

## 2021-09-03 DIAGNOSIS — Z6827 Body mass index (BMI) 27.0-27.9, adult: Secondary | ICD-10-CM | POA: Diagnosis not present

## 2021-09-03 DIAGNOSIS — D696 Thrombocytopenia, unspecified: Secondary | ICD-10-CM | POA: Diagnosis not present

## 2021-09-03 DIAGNOSIS — N281 Cyst of kidney, acquired: Secondary | ICD-10-CM | POA: Diagnosis not present

## 2021-09-03 DIAGNOSIS — I952 Hypotension due to drugs: Secondary | ICD-10-CM | POA: Diagnosis not present

## 2021-09-03 DIAGNOSIS — N200 Calculus of kidney: Secondary | ICD-10-CM | POA: Diagnosis not present

## 2021-09-06 DIAGNOSIS — R350 Frequency of micturition: Secondary | ICD-10-CM | POA: Diagnosis not present

## 2021-09-06 DIAGNOSIS — I10 Essential (primary) hypertension: Secondary | ICD-10-CM | POA: Diagnosis not present

## 2021-09-06 DIAGNOSIS — Z299 Encounter for prophylactic measures, unspecified: Secondary | ICD-10-CM | POA: Diagnosis not present

## 2021-09-06 DIAGNOSIS — Z6829 Body mass index (BMI) 29.0-29.9, adult: Secondary | ICD-10-CM | POA: Diagnosis not present

## 2021-09-06 DIAGNOSIS — N419 Inflammatory disease of prostate, unspecified: Secondary | ICD-10-CM | POA: Diagnosis not present

## 2021-09-06 DIAGNOSIS — R509 Fever, unspecified: Secondary | ICD-10-CM | POA: Diagnosis not present

## 2021-09-06 DIAGNOSIS — E1165 Type 2 diabetes mellitus with hyperglycemia: Secondary | ICD-10-CM | POA: Diagnosis not present

## 2021-09-06 DIAGNOSIS — Z87891 Personal history of nicotine dependence: Secondary | ICD-10-CM | POA: Diagnosis not present

## 2021-09-08 ENCOUNTER — Ambulatory Visit (HOSPITAL_COMMUNITY)
Admission: RE | Admit: 2021-09-08 | Discharge: 2021-09-08 | Disposition: A | Discharge status: Home / Self Care | Payer: PPO | Source: Ambulatory Visit | Attending: Urology | Admitting: Urology

## 2021-09-08 ENCOUNTER — Other Ambulatory Visit: Payer: Self-pay

## 2021-09-08 ENCOUNTER — Ambulatory Visit (INDEPENDENT_AMBULATORY_CARE_PROVIDER_SITE_OTHER): Payer: PPO | Admitting: Urology

## 2021-09-08 ENCOUNTER — Encounter: Payer: Self-pay | Admitting: Urology

## 2021-09-08 VITALS — BP 136/71 | HR 91

## 2021-09-08 DIAGNOSIS — R1032 Left lower quadrant pain: Secondary | ICD-10-CM | POA: Diagnosis not present

## 2021-09-08 DIAGNOSIS — N200 Calculus of kidney: Secondary | ICD-10-CM | POA: Diagnosis not present

## 2021-09-08 DIAGNOSIS — R7303 Prediabetes: Secondary | ICD-10-CM | POA: Diagnosis not present

## 2021-09-08 DIAGNOSIS — I700 Atherosclerosis of aorta: Secondary | ICD-10-CM | POA: Diagnosis not present

## 2021-09-08 DIAGNOSIS — N170 Acute kidney failure with tubular necrosis: Secondary | ICD-10-CM | POA: Diagnosis not present

## 2021-09-08 DIAGNOSIS — D696 Thrombocytopenia, unspecified: Secondary | ICD-10-CM | POA: Diagnosis not present

## 2021-09-08 DIAGNOSIS — D472 Monoclonal gammopathy: Secondary | ICD-10-CM | POA: Diagnosis not present

## 2021-09-08 DIAGNOSIS — N400 Enlarged prostate without lower urinary tract symptoms: Secondary | ICD-10-CM | POA: Diagnosis not present

## 2021-09-08 DIAGNOSIS — R809 Proteinuria, unspecified: Secondary | ICD-10-CM | POA: Diagnosis not present

## 2021-09-08 DIAGNOSIS — R161 Splenomegaly, not elsewhere classified: Secondary | ICD-10-CM | POA: Diagnosis not present

## 2021-09-08 DIAGNOSIS — N1832 Chronic kidney disease, stage 3b: Secondary | ICD-10-CM | POA: Diagnosis not present

## 2021-09-08 DIAGNOSIS — N281 Cyst of kidney, acquired: Secondary | ICD-10-CM | POA: Diagnosis not present

## 2021-09-08 DIAGNOSIS — N1339 Other hydronephrosis: Secondary | ICD-10-CM | POA: Diagnosis not present

## 2021-09-08 DIAGNOSIS — I952 Hypotension due to drugs: Secondary | ICD-10-CM | POA: Diagnosis not present

## 2021-09-08 LAB — MICROSCOPIC EXAMINATION
Bacteria, UA: NONE SEEN
Renal Epithel, UA: NONE SEEN /hpf
WBC, UA: NONE SEEN /hpf (ref 0–5)

## 2021-09-08 LAB — URINALYSIS, ROUTINE W REFLEX MICROSCOPIC
Bilirubin, UA: NEGATIVE
Glucose, UA: NEGATIVE
Ketones, UA: NEGATIVE
Leukocytes,UA: NEGATIVE
Nitrite, UA: NEGATIVE
Specific Gravity, UA: 1.025 (ref 1.005–1.030)
Urobilinogen, Ur: 0.2 mg/dL (ref 0.2–1.0)
pH, UA: 5.5 (ref 5.0–7.5)

## 2021-09-08 NOTE — Progress Notes (Signed)
09/08/2021 2:05 PM   [name] [birth year] [id number]  Referring provider: Liana Gerold, MD [phone],  [place] [address]  nephrolithiasis   HPI: [name] is a 76yo here for evaluation of nephrolithiasis and new left hydronephrosis. His last stone event was 20-25 years ago. For the last month he has been having intermittent sharp, moderate left lower back pain. No significant LUTS. NO hematuria Renal US 08/09/2021 shows bilateral calculi and new left hydronephrosis   PMH: Past Medical History:  Diagnosis Date   Arthritis    Cataracts, bilateral 11/2016   Diabetes mellitus without complication ([place])    Gout    Hyperlipidemia    Hypertension    Joint ache    Prostate atrophy 2018    Surgical History: Past Surgical History:  Procedure Laterality Date   COLON RESECTION  2015   HERNIA REPAIR     LUMBAR FUSION  1990   OTHER SURGICAL HISTORY Left    Arm s/p accident   REPLACEMENT TOTAL KNEE BILATERAL Bilateral 1990   TRANSURETHRAL RESECTION OF PROSTATE N/A 07/04/2017   Procedure: TRANSURETHRAL RESECTION OF THE PROSTATE (TURP);  Surgeon: Irine Seal, MD;  Location: [facility];  Service: Urology;  Laterality: N/A;    Home Medications:  Allergies as of 09/08/2021   No Known Allergies      Medication List        Accurate as of September 08, 2021  2:05 PM. If you have any questions, ask your nurse or doctor.          albuterol 108 (90 Base) MCG/ACT inhaler Commonly known as: VENTOLIN HFA Inhale 2 puffs into the lungs every 6 (six) hours as needed for wheezing or shortness of breath.   amLODipine 5 MG tablet Commonly known as: NORVASC Take 5 mg by mouth daily.   b complex vitamins capsule Take by mouth.   Fish Oil 1000 MG Caps Take by mouth daily.   glucosamine-chondroitin 500-400 MG tablet Take 1 tablet by mouth daily.   levofloxacin 500 MG tablet Commonly known as: LEVAQUIN Take 500 mg by mouth daily.   lisinopril 10 MG  tablet Commonly known as: ZESTRIL Take 10 mg by mouth every evening.   metoprolol succinate 100 MG 24 hr tablet Commonly known as: TOPROL-XL Take 100 mg by mouth daily.   multivitamin with minerals Tabs tablet Take 1 tablet by mouth daily.   naproxen sodium 220 MG tablet Commonly known as: ALEVE Take 220 mg by mouth daily as needed.   OneTouch Delica Plus [id number] Misc See admin instructions.   OneTouch Verio Flex System w/Device Kit daily.   OneTouch Verio test strip Generic drug: glucose blood 1 each daily.   rosuvastatin 5 MG tablet Commonly known as: CRESTOR Take 5 mg by mouth at bedtime.   vitamin C 500 MG tablet Commonly known as: ASCORBIC ACID Take 1,000 mg by mouth daily.   Vitamin D3 125 MCG (5000 UT) Caps Take by mouth daily.        Allergies: No Known Allergies  Family History: Family History  Problem Relation Age of Onset   COPD Father    Throat cancer Father    Cancer Brother     Social History:  reports that he quit smoking about 29 years ago. He has never used smokeless tobacco. He reports current alcohol use. He reports that he does not use drugs.  ROS: All other review of systems were reviewed and are negative except what is noted  above in HPI  Physical Exam: BP 136/71    Pulse 91   Constitutional:  Alert and oriented, No acute distress. HEENT: [place] AT, moist mucus membranes.  Trachea midline, no masses. Cardiovascular: No clubbing, cyanosis, or edema. Respiratory: Normal respiratory effort, no increased work of breathing. GI: Abdomen is soft, nontender, nondistended, no abdominal masses GU: No CVA tenderness.  Lymph: No cervical or inguinal lymphadenopathy. Skin: No rashes, bruises or suspicious lesions. Neurologic: Grossly intact, no focal deficits, moving all 4 extremities. Psychiatric: Normal mood and affect.  Laboratory Data: Lab Results  Component Value Date   WBC 7.6 07/01/2021   HGB 13.6 07/01/2021   HCT 37.5 (L)  07/01/2021   MCV 88.9 07/01/2021   PLT 121 (L) 07/01/2021    Lab Results  Component Value Date   CREATININE 1.79 (H) 07/01/2021    No results found for: PSA  No results found for: TESTOSTERONE  No results found for: HGBA1C  Urinalysis    Component Value Date/Time   COLORURINE YELLOW 03/09/2018 [place] 03/09/2018 1244   LABSPEC 1.017 03/09/2018 1244   PHURINE 5.0 03/09/2018 [place] 03/09/2018 1244   HGBUR LARGE (A) 03/09/2018 [place] 03/09/2018 [place] 03/09/2018 1244   PROTEINUR 30 (A) 03/09/2018 1244   UROBILINOGEN 0.2 10/01/2008 0926   NITRITE NEGATIVE 03/09/2018 1244   LEUKOCYTESUR MODERATE (A) 03/09/2018 1244    Lab Results  Component Value Date   BACTERIA RARE (A) 03/09/2018    Pertinent Imaging: CT 11/2017: Images reviewed and discussed with the patient  No results found for this or any previous visit.  No results found for this or any previous visit.  No results found for this or any previous visit.  No results found for this or any previous visit.  Results for orders placed during the hospital encounter of 08/09/21  US RENAL  Narrative CLINICAL DATA:  Chronic renal disease.  EXAM: RENAL / URINARY TRACT ULTRASOUND COMPLETE  COMPARISON:  PET-CT 05/28/2018.  CT abdomen 04/05/2018.  FINDINGS: Right Kidney:  Renal measurements: 13.6 x 5.3 x 5.5 cm = volume: 206 mL. Cortical irregularity consistent scarring. Echogenicity within normal limits. Simple cysts with the largest measuring 1.4 cm. Probable 3 mm nonobstructing calyceal stone. No hydronephrosis visualized.  Left Kidney:  Renal measurements: 12.5 x 5.5 x 4.3 cm = volume: 159 mL. Cortical irregularity consistent with scarring. Echogenicity within normal limits. 7 mm simple cyst cannot be excluded. Mild left hydronephrosis noted.  Bladder:  Appears normal for degree of bladder distention. Prevoid volume 241 cc.  Postvoid volume 34 cc.  Other:  None.  IMPRESSION: 1. Bilateral cortical irregularity consistent with scarring. Renal echogenicity normal. Bilateral simple renal cysts noted.  2.  Probable 3 mm nonobstructing right renal calyceal stone.  3.  Mild left hydronephrosis noted.  Bladder is nondistended.   Electronically Signed By: Marcello Moores  Register M.D. On: 08/10/2021 04:32  No results found for this or any previous visit.  No results found for this or any previous visit.  No results found for this or any previous visit.   Assessment & Plan:    1. Kidney stones -STAT CT stone study, will call with results. - Urinalysis, Routine w reflex microscopic   No follow-ups on file.  Nicolette Bang, MD  [facility] [place]

## 2021-09-15 ENCOUNTER — Other Ambulatory Visit: Payer: Self-pay

## 2021-09-15 ENCOUNTER — Ambulatory Visit: Payer: PPO | Admitting: Urology

## 2021-09-15 VITALS — BP 118/62 | HR 80

## 2021-09-15 DIAGNOSIS — N138 Other obstructive and reflux uropathy: Secondary | ICD-10-CM | POA: Diagnosis not present

## 2021-09-15 DIAGNOSIS — N200 Calculus of kidney: Secondary | ICD-10-CM

## 2021-09-15 DIAGNOSIS — N401 Enlarged prostate with lower urinary tract symptoms: Secondary | ICD-10-CM | POA: Diagnosis not present

## 2021-09-15 LAB — MICROSCOPIC EXAMINATION
Bacteria, UA: NONE SEEN
Epithelial Cells (non renal): NONE SEEN /hpf (ref 0–10)
RBC, Urine: NONE SEEN /hpf (ref 0–2)
Renal Epithel, UA: NONE SEEN /hpf
WBC, UA: NONE SEEN /hpf (ref 0–5)

## 2021-09-15 LAB — URINALYSIS, ROUTINE W REFLEX MICROSCOPIC
Bilirubin, UA: NEGATIVE
Glucose, UA: NEGATIVE
Ketones, UA: NEGATIVE
Leukocytes,UA: NEGATIVE
Nitrite, UA: NEGATIVE
RBC, UA: NEGATIVE
Specific Gravity, UA: >1.03 — ABNORMAL HIGH (ref 1.005–1.030)
Urobilinogen, Ur: 0.2 mg/dL (ref 0.2–1.0)
pH, UA: 5.5 (ref 5.0–7.5)

## 2021-09-15 NOTE — Progress Notes (Signed)
09/15/2021 2:56 PM   [name] 12-07-1945 [id number]  Referring provider: Monico Blitz, MD [address] [place],  [place] [address]  Followup left hydronephrosis   HPI: [name] is a 76yo here for followup for left hydronephrosis. He underwetn CT stone study last week which showed pelvic and retroperitoneal lymphadenopathy with mild obstruction of the left ureter. He does not have a recent PSA. He denies nay flank pain currently. He denies any worsening LUTS. No gross hematuria.    PMH: Past Medical History:  Diagnosis Date   Arthritis    Cataracts, bilateral 11/2016   Diabetes mellitus without complication ([place])    Gout    Hyperlipidemia    Hypertension    Joint ache    Prostate atrophy 2018    Surgical History: Past Surgical History:  Procedure Laterality Date   COLON RESECTION  2015   HERNIA REPAIR     LUMBAR FUSION  1990   OTHER SURGICAL HISTORY Left    Arm s/p accident   REPLACEMENT TOTAL KNEE BILATERAL Bilateral 1990   TRANSURETHRAL RESECTION OF PROSTATE N/A 07/04/2017   Procedure: TRANSURETHRAL RESECTION OF THE PROSTATE (TURP);  Surgeon: Irine Seal, MD;  Location: [facility];  Service: Urology;  Laterality: N/A;    Home Medications:  Allergies as of 09/15/2021   No Known Allergies      Medication List        Accurate as of September 15, 2021  2:56 PM. If you have any questions, ask your nurse or doctor.          albuterol 108 (90 Base) MCG/ACT inhaler Commonly known as: VENTOLIN HFA Inhale 2 puffs into the lungs every 6 (six) hours as needed for wheezing or shortness of breath.   amLODipine 5 MG tablet Commonly known as: NORVASC Take 5 mg by mouth daily.   b complex vitamins capsule Take by mouth.   Fish Oil 1000 MG Caps Take by mouth daily.   glucosamine-chondroitin 500-400 MG tablet Take 1 tablet by mouth daily.   levofloxacin 500 MG tablet Commonly known as: LEVAQUIN Take 500 mg by mouth daily.   lisinopril 10 MG tablet Commonly  known as: ZESTRIL Take 10 mg by mouth every evening.   metoprolol succinate 100 MG 24 hr tablet Commonly known as: TOPROL-XL Take 100 mg by mouth daily.   multivitamin with minerals Tabs tablet Take 1 tablet by mouth daily.   naproxen sodium 220 MG tablet Commonly known as: ALEVE Take 220 mg by mouth daily as needed.   OneTouch Delica Plus [id number] Misc See admin instructions.   OneTouch Verio Flex System w/Device Kit daily.   OneTouch Verio test strip Generic drug: glucose blood 1 each daily.   rosuvastatin 5 MG tablet Commonly known as: CRESTOR Take 5 mg by mouth at bedtime.   vitamin C 500 MG tablet Commonly known as: ASCORBIC ACID Take 1,000 mg by mouth daily.   Vitamin D3 125 MCG (5000 UT) Caps Take by mouth daily.        Allergies: No Known Allergies  Family History: Family History  Problem Relation Age of Onset   COPD Father    Throat cancer Father    Cancer Brother     Social History:  reports that he quit smoking about 29 years ago. He has never used smokeless tobacco. He reports current alcohol use. He reports that he does not use drugs.  ROS: All other review of systems were reviewed and are negative except what is  noted above in HPI  Physical Exam: BP 118/62    Pulse 80   Constitutional:  Alert and oriented, No acute distress. HEENT: [place] AT, moist mucus membranes.  Trachea midline, no masses. Cardiovascular: No clubbing, cyanosis, or edema. Respiratory: Normal respiratory effort, no increased work of breathing. GI: Abdomen is soft, nontender, nondistended, no abdominal masses GU: No CVA tenderness.  Lymph: No cervical or inguinal lymphadenopathy. Skin: No rashes, bruises or suspicious lesions. Neurologic: Grossly intact, no focal deficits, moving all 4 extremities. Psychiatric: Normal mood and affect.  Laboratory Data: Lab Results  Component Value Date   WBC 7.6 07/01/2021   HGB 13.6 07/01/2021   HCT 37.5 (L) 07/01/2021   MCV 88.9  07/01/2021   PLT 121 (L) 07/01/2021    Lab Results  Component Value Date   CREATININE 1.79 (H) 07/01/2021    No results found for: PSA  No results found for: TESTOSTERONE  No results found for: HGBA1C  Urinalysis    Component Value Date/Time   COLORURINE YELLOW 03/09/2018 [place] 09/08/2021 1310   LABSPEC 1.017 03/09/2018 1244   PHURINE 5.0 03/09/2018 1244   GLUCOSEU Negative 09/08/2021 1310   HGBUR LARGE (A) 03/09/2018 1244   BILIRUBINUR Negative 09/08/2021 1310   KETONESUR NEGATIVE 03/09/2018 1244   PROTEINUR 3+ (A) 09/08/2021 1310   PROTEINUR 30 (A) 03/09/2018 1244   UROBILINOGEN 0.2 10/01/2008 0926   NITRITE Negative 09/08/2021 1310   NITRITE NEGATIVE 03/09/2018 1244   LEUKOCYTESUR Negative 09/08/2021 1310    Lab Results  Component Value Date   LABMICR See below: 09/08/2021   WBCUA None seen 09/08/2021   LABEPIT 0-10 09/08/2021   MUCUS Present 09/08/2021   BACTERIA None seen 09/08/2021    Pertinent Imaging: CT stone study2/03/2022: Images reviewed and discussed with the patient No results found for this or any previous visit.  No results found for this or any previous visit.  No results found for this or any previous visit.  No results found for this or any previous visit.  Results for orders placed during the hospital encounter of 08/09/21  US RENAL  Narrative CLINICAL DATA:  Chronic renal disease.  EXAM: RENAL / URINARY TRACT ULTRASOUND COMPLETE  COMPARISON:  PET-CT 05/28/2018.  CT abdomen 04/05/2018.  FINDINGS: Right Kidney:  Renal measurements: 13.6 x 5.3 x 5.5 cm = volume: 206 mL. Cortical irregularity consistent scarring. Echogenicity within normal limits. Simple cysts with the largest measuring 1.4 cm. Probable 3 mm nonobstructing calyceal stone. No hydronephrosis visualized.  Left Kidney:  Renal measurements: 12.5 x 5.5 x 4.3 cm = volume: 159 mL. Cortical irregularity consistent with scarring. Echogenicity within  normal limits. 7 mm simple cyst cannot be excluded. Mild left hydronephrosis noted.  Bladder:  Appears normal for degree of bladder distention. Prevoid volume 241 cc. Postvoid volume 34 cc.  Other:  None.  IMPRESSION: 1. Bilateral cortical irregularity consistent with scarring. Renal echogenicity normal. Bilateral simple renal cysts noted.  2.  Probable 3 mm nonobstructing right renal calyceal stone.  3.  Mild left hydronephrosis noted.  Bladder is nondistended.   Electronically Signed By: Marcello Moores  Register M.D. On: 08/10/2021 04:32  No results found for this or any previous visit.  No results found for this or any previous visit.  Results for orders placed in visit on 09/08/21  CT RENAL STONE STUDY  Narrative CLINICAL DATA:  Flank pain.  EXAM: CT ABDOMEN AND PELVIS WITHOUT CONTRAST  TECHNIQUE: Multidetector CT imaging of the abdomen and pelvis  was performed following the standard protocol without IV contrast.  RADIATION DOSE REDUCTION: This exam was performed according to the departmental dose-optimization program which includes automated exposure control, adjustment of the mA and/or kV according to patient size and/or use of iterative reconstruction technique.  COMPARISON:  PET-CT scan 05/20/2018 on CT scan 04/05/2018  FINDINGS: Lower chest: Segmental pattern of new nodularity in the RIGHT lower lobe. Approximately 10 nodules with peripheral ground-glass density. Example nodule measures 11 mm (image 5/series 4)  Hepatobiliary: No focal hepatic lesion. No biliary duct dilatation. Common bile duct is normal.  Pancreas: Pancreas is normal. No ductal dilatation. No pancreatic inflammation.  Spleen: Enlarged spleen measures 13 cm 15.5 cm 7.9 cm (volume = 830 cm^3).  Adrenals/urinary tract: Adrenal glands normal. Small nonobstructing calculus in the LEFT kidney. No ureterolithiasis or obstructive uropathy bladder normal  Stomach/Bowel: Stomach normal.  Small bowel normal. Post RIGHT hemicolectomy. LEFT colon normal.  Vascular/Lymphatic: Intimal calcification of the aorta which is nonaneurysmal.  There is new periaortic lymphadenopathy. There is hazy retroperitoneum surrounding the kidneys and aortic adenopathy. Example lymph node LEFT of the aorta at the level of the LEFT kidney measures 15 mm short axis (image 35/2) this is enlarged from comparison PET-CT scan.  There are multiple periaortic lymph nodes.  Similar size.  Adenopathy extends the pelvis. LEFT external iliac node measures 12 mm (image 63/2). LEFT inguinal node is prominent at 15 mm.  Large LEFT external iliac node anterior the operator space measures 20 mm short axis (72/2). This node may be assessable for biopsy.  Reproductive: Prostate gland is enlarged. TURP defect centrally in the gland. Gland measures 5.3 4.7 by 4.7 cm (volume = 61 cm^3).  Other: Small amount free fluid the pelvis.  Musculoskeletal: No aggressive osseous lesion.  IMPRESSION: 1. New bulky periaortic retroperitoneal adenopathy. Adenopathy extends along the iliac chains with bulky external iliac nodes. Findings concerning for lymphoma versus metastatic prostate carcinoma. With splenomegaly, favor lymphoproliferative disease. 2. Periaortic retroperitoneal stranding around the aorta and kidneys favored related adenopathy. 3. Splenomegaly. 4. New segmental nodular pattern in the RIGHT lower lobe. Differential includes pulmonary infection versus neoplasm. Favor infection.   Electronically Signed By: Suzy Bouchard M.D. On: 09/08/2021 15:53   Assessment & Plan:    1. Left hydronephrosis -We discussed the management including observation versus left ureteral stent placement and the patient elects for stent placement. We will also request lymph node biopsy from interventional radiology. PSA today due to concern that lymphadenopathy represents metastatic prostate cancer.  - Urinalysis,  Routine w reflex microscopic     No follow-ups on file.  Nicolette Bang, MD  [facility] [place]

## 2021-09-16 ENCOUNTER — Encounter: Payer: Self-pay | Admitting: Urology

## 2021-09-16 LAB — PSA: Prostate Specific Ag, Serum: 4.7 ng/mL — ABNORMAL HIGH (ref 0.0–4.0)

## 2021-09-16 NOTE — Patient Instructions (Signed)
Hydronephrosis ?Hydronephrosis is the swelling of one or both kidneys due to a blockage that stops urine from flowing out of the body. Kidneys filter waste from the blood and produce urine. This condition can lead to kidney failure and may become life-threatening if not treated promptly. ?What are the causes? ?In infants and children, common causes include problems that occur when a baby is developing in the womb. These can include problems in the kidneys or in the tubes that drain urine into the bladder (ureters). ?In adults, common causes include: ?Kidney stones. ?Pregnancy. ?A tumor or cyst in the abdomen or pelvis. ?An enlarged prostate gland. ?Other causes include: ?Bladder infection. ?Scar tissue from a previous surgery or injury. ?A blood clot. ?Cancer of the prostate, bladder, uterus, ovary, or colon. ?What are the signs or symptoms? ?Symptoms of this condition include: ?Pain or discomfort in your side (flank) or abdomen. ?Swelling in your abdomen. ?Nausea and vomiting. ?Fever. ?Pain when passing urine. ?Feelings of urgency when you need to urinate. ?Urinating more often than normal. ?In some cases, you may not have any symptoms. ?How is this diagnosed? ?This condition may be diagnosed based on: ?Your symptoms and medical history. ?A physical exam. ?Blood and urine tests. ?Imaging tests, such as an ultrasound, CT scan, or MRI. ?A procedure to look at your urinary tract and bladder by inserting a scope into the urethra (cystoscopy). ?How is this treated? ?Treatment for this condition depends on where the blockage is, how long it has been there, and what caused it. The goal of treatment is to remove the blockage. Treatment may include: ?Antibiotic medicines to treat or prevent infection. ?A procedure to place a small, thin tube (stent) into a blocked ureter. The stent will keep the ureter open so that urine can drain through it. ?A nonsurgical procedure that crushes kidney stones with shock waves  (extracorporeal shock wave lithotripsy). ?If kidney failure occurs, treatment may include dialysis or a kidney transplant. ?Follow these instructions at home: ? ?Take over-the-counter and prescription medicines only as told by your health care provider. ?If you were prescribed an antibiotic medicine, take it exactly as told by your health care provider. Do not stop taking the antibiotic even if you start to feel better. ?Rest and return to your normal activities as told by your health care provider. Ask your health care provider what activities are safe for you. ?Drink enough fluid to keep your urine pale yellow. ?Keep all follow-up visits. This is important. ?Contact a health care provider if: ?You continue to have symptoms after treatment. ?You develop new symptoms. ?Your urine becomes cloudy or bloody. ?You have a fever. ?Get help right away if: ?You have severe flank or abdominal pain. ?You cannot drink fluids without vomiting. ?Summary ?Hydronephrosis is the swelling of one or both kidneys due to a blockage that stops urine from flowing out of the body. ?Hydronephrosis can lead to kidney failure and may become life-threatening if not treated promptly. ?The goal of treatment is to remove the blockage. It may include a procedure to insert a stent into a blocked ureter, a procedure to break up kidney stones, or taking antibiotic medicines. ?Follow your health care provider's instructions for taking care of yourself at home, including instructions about drinking fluids, taking medicines, and limiting activities. ?This information is not intended to replace advice given to you by your health care provider. Make sure you discuss any questions you have with your health care provider. ?Document Revised: 11/05/2019 Document Reviewed: 11/05/2019 ?Elsevier Patient   Education ? 2022 [employer] ? ?

## 2021-09-20 ENCOUNTER — Telehealth: Payer: Self-pay

## 2021-09-20 NOTE — Telephone Encounter (Signed)
Patient wife called requesting psa results.

## 2021-09-21 ENCOUNTER — Other Ambulatory Visit: Payer: Self-pay | Admitting: Urology

## 2021-09-21 ENCOUNTER — Encounter: Payer: Self-pay | Admitting: Urology

## 2021-09-21 NOTE — Patient Instructions (Signed)
Hydronephrosis ?Hydronephrosis is the swelling of one or both kidneys due to a blockage that stops urine from flowing out of the body. Kidneys filter waste from the blood and produce urine. This condition can lead to kidney failure and may become life-threatening if not treated promptly. ?What are the causes? ?In infants and children, common causes include problems that occur when a baby is developing in the womb. These can include problems in the kidneys or in the tubes that drain urine into the bladder (ureters). ?In adults, common causes include: ?Kidney stones. ?Pregnancy. ?A tumor or cyst in the abdomen or pelvis. ?An enlarged prostate gland. ?Other causes include: ?Bladder infection. ?Scar tissue from a previous surgery or injury. ?A blood clot. ?Cancer of the prostate, bladder, uterus, ovary, or colon. ?What are the signs or symptoms? ?Symptoms of this condition include: ?Pain or discomfort in your side (flank) or abdomen. ?Swelling in your abdomen. ?Nausea and vomiting. ?Fever. ?Pain when passing urine. ?Feelings of urgency when you need to urinate. ?Urinating more often than normal. ?In some cases, you may not have any symptoms. ?How is this diagnosed? ?This condition may be diagnosed based on: ?Your symptoms and medical history. ?A physical exam. ?Blood and urine tests. ?Imaging tests, such as an ultrasound, CT scan, or MRI. ?A procedure to look at your urinary tract and bladder by inserting a scope into the urethra (cystoscopy). ?How is this treated? ?Treatment for this condition depends on where the blockage is, how long it has been there, and what caused it. The goal of treatment is to remove the blockage. Treatment may include: ?Antibiotic medicines to treat or prevent infection. ?A procedure to place a small, thin tube (stent) into a blocked ureter. The stent will keep the ureter open so that urine can drain through it. ?A nonsurgical procedure that crushes kidney stones with shock waves  (extracorporeal shock wave lithotripsy). ?If kidney failure occurs, treatment may include dialysis or a kidney transplant. ?Follow these instructions at home: ? ?Take over-the-counter and prescription medicines only as told by your health care provider. ?If you were prescribed an antibiotic medicine, take it exactly as told by your health care provider. Do not stop taking the antibiotic even if you start to feel better. ?Rest and return to your normal activities as told by your health care provider. Ask your health care provider what activities are safe for you. ?Drink enough fluid to keep your urine pale yellow. ?Keep all follow-up visits. This is important. ?Contact a health care provider if: ?You continue to have symptoms after treatment. ?You develop new symptoms. ?Your urine becomes cloudy or bloody. ?You have a fever. ?Get help right away if: ?You have severe flank or abdominal pain. ?You cannot drink fluids without vomiting. ?Summary ?Hydronephrosis is the swelling of one or both kidneys due to a blockage that stops urine from flowing out of the body. ?Hydronephrosis can lead to kidney failure and may become life-threatening if not treated promptly. ?The goal of treatment is to remove the blockage. It may include a procedure to insert a stent into a blocked ureter, a procedure to break up kidney stones, or taking antibiotic medicines. ?Follow your health care provider's instructions for taking care of yourself at home, including instructions about drinking fluids, taking medicines, and limiting activities. ?This information is not intended to replace advice given to you by your health care provider. Make sure you discuss any questions you have with your health care provider. ?Document Revised: 11/05/2019 Document Reviewed: 11/05/2019 ?Elsevier Patient   Education ? 2022 [employer] ? ?

## 2021-09-23 DIAGNOSIS — Z6829 Body mass index (BMI) 29.0-29.9, adult: Secondary | ICD-10-CM | POA: Diagnosis not present

## 2021-09-23 DIAGNOSIS — Z87891 Personal history of nicotine dependence: Secondary | ICD-10-CM | POA: Diagnosis not present

## 2021-09-23 DIAGNOSIS — Z87898 Personal history of other specified conditions: Secondary | ICD-10-CM | POA: Diagnosis not present

## 2021-09-23 DIAGNOSIS — N1330 Unspecified hydronephrosis: Secondary | ICD-10-CM | POA: Diagnosis not present

## 2021-09-23 DIAGNOSIS — E1165 Type 2 diabetes mellitus with hyperglycemia: Secondary | ICD-10-CM | POA: Diagnosis not present

## 2021-09-23 DIAGNOSIS — Z299 Encounter for prophylactic measures, unspecified: Secondary | ICD-10-CM | POA: Diagnosis not present

## 2021-09-23 DIAGNOSIS — I10 Essential (primary) hypertension: Secondary | ICD-10-CM | POA: Diagnosis not present

## 2021-09-27 DIAGNOSIS — E119 Type 2 diabetes mellitus without complications: Secondary | ICD-10-CM | POA: Diagnosis not present

## 2021-10-02 DIAGNOSIS — Z6827 Body mass index (BMI) 27.0-27.9, adult: Secondary | ICD-10-CM | POA: Diagnosis not present

## 2021-10-02 DIAGNOSIS — R809 Proteinuria, unspecified: Secondary | ICD-10-CM | POA: Diagnosis not present

## 2021-10-02 DIAGNOSIS — N1339 Other hydronephrosis: Secondary | ICD-10-CM | POA: Diagnosis not present

## 2021-10-02 DIAGNOSIS — E871 Hypo-osmolality and hyponatremia: Secondary | ICD-10-CM | POA: Diagnosis not present

## 2021-10-02 DIAGNOSIS — D638 Anemia in other chronic diseases classified elsewhere: Secondary | ICD-10-CM | POA: Diagnosis not present

## 2021-10-02 DIAGNOSIS — N1832 Chronic kidney disease, stage 3b: Secondary | ICD-10-CM | POA: Diagnosis not present

## 2021-10-06 ENCOUNTER — Other Ambulatory Visit: Payer: Self-pay

## 2021-10-06 ENCOUNTER — Inpatient Hospital Stay (HOSPITAL_COMMUNITY): Payer: PPO | Attending: Hematology | Admitting: Hematology

## 2021-10-06 VITALS — BP 131/82 | HR 94 | Temp 97.7°F | Resp 18 | Wt 180.4 lb

## 2021-10-06 DIAGNOSIS — N189 Chronic kidney disease, unspecified: Secondary | ICD-10-CM | POA: Insufficient documentation

## 2021-10-06 DIAGNOSIS — R591 Generalized enlarged lymph nodes: Secondary | ICD-10-CM | POA: Insufficient documentation

## 2021-10-06 DIAGNOSIS — D631 Anemia in chronic kidney disease: Secondary | ICD-10-CM | POA: Diagnosis not present

## 2021-10-06 DIAGNOSIS — E611 Iron deficiency: Secondary | ICD-10-CM | POA: Insufficient documentation

## 2021-10-06 NOTE — Progress Notes (Signed)
[name], [name]   CLINIC:  Medical Oncology/Hematology  PCP:  Monico Blitz, [place] / [place] [address] [phone]   REASON FOR VISIT:  Follow-up for generalized lymphadenopathy  PRIOR THERAPY: none  NGS Results: not done  CURRENT THERAPY: surveillance  BRIEF ONCOLOGIC HISTORY:  Oncology History   No history exists.    CANCER STAGING: Cancer Staging  No matching staging information was found for the patient.  INTERVAL HISTORY:  Kevin Hooper, a 76 y.o. male, returns for routine follow-up of his generalized lymphadenopathy. Mathieu was last seen on 07/07/2021.   Today he reports feeling good. He denies fevers, night sweats, and weight loss. He reports difficulty urinating and urinary frequency.   REVIEW OF SYSTEMS:  Review of Systems  Constitutional:  Negative for appetite change, fatigue, fever and unexpected weight change.  Endocrine: Negative for hot flashes.  Genitourinary:  Positive for difficulty urinating and frequency.   Musculoskeletal:  Positive for arthralgias (3/10).  All other systems reviewed and are negative.  PAST MEDICAL/SURGICAL HISTORY:  Past Medical History:  Diagnosis Date   Arthritis    Cataracts, bilateral 11/2016   Diabetes mellitus without complication ([place])    Gout    Hyperlipidemia    Hypertension    Joint ache    Prostate atrophy 2018   Past Surgical History:  Procedure Laterality Date   COLON RESECTION  2015   HERNIA REPAIR     LUMBAR FUSION  1990   OTHER SURGICAL HISTORY Left    Arm s/p accident   REPLACEMENT TOTAL KNEE BILATERAL Bilateral 1990   TRANSURETHRAL RESECTION OF PROSTATE N/A 07/04/2017   Procedure: TRANSURETHRAL RESECTION OF THE PROSTATE (TURP);  Surgeon: Irine Seal, MD;  Location: [facility];  Service: Urology;  Laterality: N/A;    SOCIAL HISTORY:  Social History   Socioeconomic History   Marital status: Married    Spouse name: Not on file    Number of children: 2   Years of education: 12+   Highest education level: Not on file  Occupational History   Occupation: Retired  Tobacco Use   Smoking status: Former    Types: Cigarettes    Quit date: 04/28/1992    Years since quitting: 29.4   Smokeless tobacco: Never  Vaping Use   Vaping Use: Never used  Substance and Sexual Activity   Alcohol use: Yes    Comment: Occasional   Drug use: No   Sexual activity: Not on file    Comment: Married  Other Topics Concern   Not on file  Social History Narrative   Lives at home w/ his wife   Right-handed   Caffeine: occasional tea   Social Determinants of Radio broadcast assistant Strain: Not on file  Food Insecurity: Not on file  Transportation Needs: Not on file  Physical Activity: Not on file  Stress: Not on file  Social Connections: Not on file  Intimate Partner Violence: Not on file    FAMILY HISTORY:  Family History  Problem Relation Age of Onset   COPD Father    Throat cancer Father    Cancer Brother     CURRENT MEDICATIONS:  Current Outpatient Medications  Medication Sig Dispense Refill   albuterol (PROVENTIL HFA;VENTOLIN HFA) 108 (90 Base) MCG/ACT inhaler Inhale 2 puffs into the lungs every 6 (six) hours as needed for wheezing or shortness of breath.      amLODipine (NORVASC) 5 MG  tablet Take 5 mg by mouth daily.     b complex vitamins capsule Take by mouth.     Blood Glucose Monitoring Suppl ([place] FLEX SYSTEM) w/Device KIT daily.     Cholecalciferol (VITAMIN D3) 125 MCG (5000 UT) CAPS Take by mouth daily.     glucosamine-chondroitin 500-400 MG tablet Take 1 tablet by mouth daily.     Lancets (ONETOUCH DELICA PLUS [id number]) [place] See admin instructions.     levofloxacin (LEVAQUIN) 500 MG tablet Take 500 mg by mouth daily. (Patient not taking: Reported on 09/15/2021)     lisinopril (PRINIVIL,ZESTRIL) 10 MG tablet Take 10 mg by mouth every evening.   2   metoprolol succinate (TOPROL-XL) 100 MG 24 hr  tablet Take 100 mg by mouth daily.   1   Multiple Vitamin (MULTIVITAMIN WITH MINERALS) TABS tablet Take 1 tablet by mouth daily.     naproxen sodium (ALEVE) 220 MG tablet Take 220 mg by mouth daily as needed. (Patient not taking: Reported on 09/08/2021)     Omega-3 Fatty Acids (FISH OIL) 1000 MG CAPS Take by mouth daily.     ONETOUCH VERIO test strip 1 each daily.     rosuvastatin (CRESTOR) 5 MG tablet Take 5 mg by mouth at bedtime. (Patient not taking: Reported on 09/15/2021)     vitamin C (ASCORBIC ACID) 500 MG tablet Take 1,000 mg by mouth daily. (Patient not taking: Reported on 09/15/2021)     No current facility-administered medications for this visit.    ALLERGIES:  No Known Allergies  PHYSICAL EXAM:  Performance status (ECOG): 1 - Symptomatic but completely ambulatory  There were no vitals filed for this visit. Wt Readings from Last 3 Encounters:  07/07/21 188 lb 7.9 oz (85.5 kg)  12/22/20 189 lb 9.5 oz (86 kg)  06/16/20 184 lb (83.5 kg)   Physical Exam Vitals reviewed.  Constitutional:      Appearance: Normal appearance.  Cardiovascular:     Rate and Rhythm: Normal rate and regular rhythm.     Pulses: Normal pulses.     Heart sounds: Normal heart sounds.  Pulmonary:     Effort: Pulmonary effort is normal.     Breath sounds: Normal breath sounds.  Lymphadenopathy:     Cervical: No cervical adenopathy.     Right cervical: No superficial cervical adenopathy.    Left cervical: No superficial cervical adenopathy.     Upper Body:     Right upper body: No supraclavicular, axillary or pectoral adenopathy.     Left upper body: No supraclavicular, axillary or pectoral adenopathy.     Lower Body: No right inguinal adenopathy. No left inguinal adenopathy.  Neurological:     General: No focal deficit present.     Mental Status: He is alert and oriented to person, place, and time.  Psychiatric:        Mood and Affect: Mood normal.        Behavior: Behavior normal.      LABORATORY DATA:  I have reviewed the labs as listed.  CBC Latest Ref Rng & Units 07/01/2021 12/15/2020 06/09/2020  WBC 4.0 - 10.5 K/uL 7.6 8.2 7.0  Hemoglobin 13.0 - 17.0 g/dL 13.6 13.7 12.6(L)  Hematocrit 39.0 - 52.0 % 37.5(L) 39.8 36.5(L)  Platelets 150 - 400 K/uL 121(L) 154 163   CMP Latest Ref Rng & Units 07/01/2021 12/15/2020 06/09/2020  Glucose 70 - 99 mg/dL 121(H) 128(H) 160(H)  BUN 8 - 23 mg/dL 47(H) 37(H) 34(H)  Creatinine  0.61 - 1.24 mg/dL 1.79(H) 1.38(H) 1.49(H)  Sodium 135 - 145 mmol/L 141 138 140  Potassium 3.5 - 5.1 mmol/L 4.4 4.2 4.4  Chloride 98 - 111 mmol/L 108 107 109  CO2 22 - 32 mmol/L 26 24 24   Calcium 8.9 - 10.3 mg/dL 9.0 8.8(L) 8.5(L)  Total Protein 6.5 - 8.1 g/dL 7.2 7.1 6.9  Total Bilirubin 0.3 - 1.2 mg/dL 0.7 1.0 0.7  Alkaline Phos 38 - 126 U/L 47 50 46  AST 15 - 41 U/L 17 21 20   ALT 0 - 44 U/L 19 30 24     DIAGNOSTIC IMAGING:  I have independently reviewed the scans and discussed with the patient. CT RENAL STONE STUDY  Result Date: 09/08/2021 CLINICAL DATA:  Flank pain. EXAM: CT ABDOMEN AND PELVIS WITHOUT CONTRAST TECHNIQUE: Multidetector CT imaging of the abdomen and pelvis was performed following the standard protocol without IV contrast. RADIATION DOSE REDUCTION: This exam was performed according to the departmental dose-optimization program which includes automated exposure control, adjustment of the mA and/or kV according to patient size and/or use of iterative reconstruction technique. COMPARISON:  PET-CT scan 05/20/2018 on CT scan 04/05/2018 FINDINGS: Lower chest: Segmental pattern of new nodularity in the RIGHT lower lobe. Approximately 10 nodules with peripheral ground-glass density. Example nodule measures 11 mm (image 5/series 4) Hepatobiliary: No focal hepatic lesion. No biliary duct dilatation. Common bile duct is normal. Pancreas: Pancreas is normal. No ductal dilatation. No pancreatic inflammation. Spleen: Enlarged spleen measures 13 cm 15.5 cm 7.9  cm (volume = 830 cm^3). Adrenals/urinary tract: Adrenal glands normal. Small nonobstructing calculus in the LEFT kidney. No ureterolithiasis or obstructive uropathy bladder normal Stomach/Bowel: Stomach normal. Small bowel normal. Post RIGHT hemicolectomy. LEFT colon normal. Vascular/Lymphatic: Intimal calcification of the aorta which is nonaneurysmal. There is new periaortic lymphadenopathy. There is hazy retroperitoneum surrounding the kidneys and aortic adenopathy. Example lymph node LEFT of the aorta at the level of the LEFT kidney measures 15 mm short axis (image 35/2) this is enlarged from comparison PET-CT scan. There are multiple periaortic lymph nodes.  Similar size. Adenopathy extends the pelvis. LEFT external iliac node measures 12 mm (image 63/2). LEFT inguinal node is prominent at 15 mm. Large LEFT external iliac node anterior the operator space measures 20 mm short axis (72/2). This node may be assessable for biopsy. Reproductive: Prostate gland is enlarged. TURP defect centrally in the gland. Gland measures 5.3 4.7 by 4.7 cm (volume = 61 cm^3). Other: Small amount free fluid the pelvis. Musculoskeletal: No aggressive osseous lesion. IMPRESSION: 1. New bulky periaortic retroperitoneal adenopathy. Adenopathy extends along the iliac chains with bulky external iliac nodes. Findings concerning for lymphoma versus metastatic prostate carcinoma. With splenomegaly, favor lymphoproliferative disease. 2. Periaortic retroperitoneal stranding around the aorta and kidneys favored related adenopathy. 3. Splenomegaly. 4. New segmental nodular pattern in the RIGHT lower lobe. Differential includes pulmonary infection versus neoplasm. Favor infection. Electronically Signed   By: Suzy Bouchard M.D.   On: 09/08/2021 15:53     ASSESSMENT:  1.  Highly likely low-grade lymphoma: - Recent CT for hematuria work-up on 04/05/2018 showed several retroperitoneal lymph nodes measuring 1.2 cm and pelvic lymph nodes  bilaterally, largest in the left obturator region measuring 2.2 cm. -Patient does not have any B symptoms including fevers, night sweats or weight loss. - CT scan from 2010 done at [facility] also showed lymphadenopathy and splenomegaly.  - PET/CT scan on 05/28/2018 showed very low metabolic activity associated with enlarged pelvic and  retroperitoneal and axillary nodes.  Spleen and bone marrow are normal.  We discussed the normal progression of low-grade lymphomas in detail. - Ultrasound of the abdomen from 05/30/2020 from [facility] showed normal liver.  Splenomegaly measuring 13.4 x 7.8 x 15.4 cm.   PLAN:  1.  Non-Hodgkin's lymphoma: -He had a history of lymphadenopathy in the retroperitoneal region dating back to 2010.  As he did not have any B symptoms, and cytopenias we were watching him. - However his renal function has been worsening lately. - I have reviewed ultrasound of the kidneys from 08/09/2021: Bilateral cortical irregularity consistent with scarring.  Renal echogenicity normal.  Mild left hydronephrosis noted. - CT renal study on 09/08/2021: Splenomegaly 13 x 15.5 x 7.9 cm.  New periaortic lymphadenopathy.  Hazy retroperitoneum surrounding the kidneys and aortic adenopathy.  Lymph node left of the aorta at the level of the left kidney measures 15 mm.  Enlarged from comparison PET scan.  Multiple periaortic lymph nodes similar size.  Adenopathy extends along the iliac chains with bulky external iliac nodes. - He does not have any B symptoms.  No recurrent infections. - I have recommended PET CT scan for further evaluation of lymphadenopathy and possible biopsy of the high uptake area. - I will also check LDH level. - RTC after PET scan.   2.  Mild normocytic anemia: -Combination anemia from CKD and relative iron deficiency. - We will check ferritin, iron panel and CBC.   Orders placed this encounter:  No orders of the defined types were placed in this  encounter.    Derek Jack, MD [place] [phone]   I, Thana Ates, am acting as a scribe for Dr. Derek Jack.  I, Derek Jack MD, have reviewed the above documentation for accuracy and completeness, and I agree with the above.

## 2021-10-06 NOTE — Patient Instructions (Signed)
[place] at [facility] ?Discharge Instructions ? ?You were seen and examined today by Dr. Delton Coombes. He reviewed your most recent labs. He recommends doing a PET scan to see if anything has changed since 2019 and if it has then would recommend biopsy. Please keep follow up appointments as scheduled.  ? ? ?Thank you for choosing [place] at [facility] to provide your oncology and hematology care.  To afford each patient quality time with our provider, please arrive at least 15 minutes before your scheduled appointment time.  ? ?If you have a lab appointment with the [facility] please come in thru the Main Entrance and check in at the main information desk. ? ?You need to re-schedule your appointment should you arrive 10 or more minutes late.  We strive to give you quality time with our providers, and arriving late affects you and other patients whose appointments are after yours.  Also, if you no show three or more times for appointments you may be dismissed from the clinic at the providers discretion.     ?Again, thank you for choosing [facility].  Our hope is that these requests will decrease the amount of time that you wait before being seen by our physicians.       ?_____________________________________________________________ ? ?Should you have questions after your visit to [facility], please contact our office at [phone] and follow the prompts.  Our office hours are 8:00 a.m. and 4:30 p.m. Monday - Friday.  Please note that voicemails left after 4:00 p.m. may not be returned until the following business day.  We are closed weekends and major holidays.  You do have access to a nurse 24-7, just call the main number to the clinic [phone] and do not press any options, hold on the line and a nurse will answer the phone.   ? ?For prescription refill requests, have your pharmacy contact our office and allow 72 hours.    ? ?Due to Covid, you will need to wear a mask upon entering the hospital. If you do not have a mask, a mask will be given to you at the Main Entrance upon arrival. For doctor visits, patients may have 1 support person age 55 or older with them. For treatment visits, patients can not have anyone with them due to social distancing guidelines and our immunocompromised population.  ? ?  ?

## 2021-10-14 ENCOUNTER — Inpatient Hospital Stay (HOSPITAL_COMMUNITY): Payer: PPO

## 2021-10-14 ENCOUNTER — Other Ambulatory Visit: Payer: Self-pay

## 2021-10-14 ENCOUNTER — Encounter (HOSPITAL_COMMUNITY)
Admission: RE | Admit: 2021-10-14 | Discharge: 2021-10-14 | Disposition: A | Discharge status: Home / Self Care | Payer: PPO | Source: Ambulatory Visit | Attending: Hematology | Admitting: Hematology

## 2021-10-14 ENCOUNTER — Other Ambulatory Visit (HOSPITAL_COMMUNITY): Payer: PPO

## 2021-10-14 DIAGNOSIS — R918 Other nonspecific abnormal finding of lung field: Secondary | ICD-10-CM | POA: Insufficient documentation

## 2021-10-14 DIAGNOSIS — I6529 Occlusion and stenosis of unspecified carotid artery: Secondary | ICD-10-CM | POA: Diagnosis not present

## 2021-10-14 DIAGNOSIS — R591 Generalized enlarged lymph nodes: Secondary | ICD-10-CM | POA: Diagnosis not present

## 2021-10-14 DIAGNOSIS — I700 Atherosclerosis of aorta: Secondary | ICD-10-CM | POA: Diagnosis not present

## 2021-10-14 DIAGNOSIS — N281 Cyst of kidney, acquired: Secondary | ICD-10-CM | POA: Diagnosis not present

## 2021-10-14 DIAGNOSIS — R161 Splenomegaly, not elsewhere classified: Secondary | ICD-10-CM | POA: Insufficient documentation

## 2021-10-14 DIAGNOSIS — N400 Enlarged prostate without lower urinary tract symptoms: Secondary | ICD-10-CM | POA: Diagnosis not present

## 2021-10-14 LAB — CBC WITH DIFFERENTIAL/PLATELET
Abs Immature Granulocytes: 0.05 10*3/uL (ref 0.00–0.07)
Basophils Absolute: 0.2 10*3/uL — ABNORMAL HIGH (ref 0.0–0.1)
Basophils Relative: 2 %
Eosinophils Absolute: 0.6 10*3/uL — ABNORMAL HIGH (ref 0.0–0.5)
Eosinophils Relative: 6 %
HCT: 34.6 % — ABNORMAL LOW (ref 39.0–52.0)
Hemoglobin: 11.7 g/dL — ABNORMAL LOW (ref 13.0–17.0)
Immature Granulocytes: 1 %
Lymphocytes Relative: 45 %
Lymphs Abs: 4 10*3/uL (ref 0.7–4.0)
MCH: 29.5 pg (ref 26.0–34.0)
MCHC: 33.8 g/dL (ref 30.0–36.0)
MCV: 87.2 fL (ref 80.0–100.0)
Monocytes Absolute: 0.8 10*3/uL (ref 0.1–1.0)
Monocytes Relative: 9 %
Neutro Abs: 3.3 10*3/uL (ref 1.7–7.7)
Neutrophils Relative %: 37 %
Platelets: 145 10*3/uL — ABNORMAL LOW (ref 150–400)
RBC: 3.97 MIL/uL — ABNORMAL LOW (ref 4.22–5.81)
RDW: 13.6 % (ref 11.5–15.5)
WBC: 8.8 10*3/uL (ref 4.0–10.5)
nRBC: 0 % (ref 0.0–0.2)

## 2021-10-14 LAB — COMPREHENSIVE METABOLIC PANEL
ALT: 31 U/L (ref 0–44)
AST: 24 U/L (ref 15–41)
Albumin: 3.3 g/dL — ABNORMAL LOW (ref 3.5–5.0)
Alkaline Phosphatase: 58 U/L (ref 38–126)
Anion gap: 7 (ref 5–15)
BUN: 46 mg/dL — ABNORMAL HIGH (ref 8–23)
CO2: 23 mmol/L (ref 22–32)
Calcium: 8.9 mg/dL (ref 8.9–10.3)
Chloride: 108 mmol/L (ref 98–111)
Creatinine, Ser: 1.73 mg/dL — ABNORMAL HIGH (ref 0.61–1.24)
GFR, Estimated: 41 mL/min — ABNORMAL LOW (ref >60)
Glucose, Bld: 98 mg/dL (ref 70–99)
Potassium: 4.4 mmol/L (ref 3.5–5.1)
Sodium: 138 mmol/L (ref 135–145)
Total Bilirubin: 0.6 mg/dL (ref 0.3–1.2)
Total Protein: 7.5 g/dL (ref 6.5–8.1)

## 2021-10-14 LAB — IRON AND TIBC
Iron: 46 ug/dL (ref 45–182)
Saturation Ratios: 16 % — ABNORMAL LOW (ref 17.9–39.5)
TIBC: 295 ug/dL (ref 250–450)
UIBC: 249 ug/dL

## 2021-10-14 LAB — LACTATE DEHYDROGENASE: LDH: 177 U/L (ref 98–192)

## 2021-10-14 LAB — FERRITIN: Ferritin: 100 ng/mL (ref 24–336)

## 2021-10-14 MED ORDER — FLUDEOXYGLUCOSE F - 18 (FDG) INJECTION
9.4900 | Freq: Once | INTRAVENOUS | Status: AC | PRN
  Administered 2021-10-14: 9.49 via INTRAVENOUS

## 2021-10-19 ENCOUNTER — Inpatient Hospital Stay (HOSPITAL_COMMUNITY): Payer: PPO | Admitting: Hematology

## 2021-10-19 ENCOUNTER — Other Ambulatory Visit: Payer: Self-pay

## 2021-10-19 DIAGNOSIS — R591 Generalized enlarged lymph nodes: Secondary | ICD-10-CM | POA: Diagnosis not present

## 2021-10-19 NOTE — Progress Notes (Signed)
? ?[facility] ?[address] ?[place], [place] [address] ? ? ?CLINIC:  ?Medical Oncology/Hematology ? ?PCP:  ?Monico Blitz, MD ?[address] [place] [address] ?[phone] ? ? ?REASON FOR VISIT:  ?Follow-up for generalized lymphadenopathy ? ?PRIOR THERAPY: none ? ?NGS Results: not done ? ?CURRENT THERAPY: under work-up ? ?BRIEF ONCOLOGIC HISTORY:  ?Oncology History  ? No history exists.  ? ? ?CANCER STAGING: ? Cancer Staging  ?No matching staging information was found for the patient. ? ?INTERVAL HISTORY:  ?Mr. YEHUDA PRINTUP, a 76 y.o. male, returns for routine follow-up of his generalized lymphadenopathy. Edman was last seen on 10/06/2021.  ? ?Today he reports feeling good. He denies fevers, night sweats, and weight loss. He denies recent injury to his left leg.  ? ?REVIEW OF SYSTEMS:  ?Review of Systems  ?Constitutional:  Negative for appetite change, fatigue, fever and unexpected weight change.  ?Endocrine: Negative for hot flashes.  ?All other systems reviewed and are negative. ? ?PAST MEDICAL/SURGICAL HISTORY:  ?Past Medical History:  ?Diagnosis Date  ? Arthritis   ? Cataracts, bilateral 11/2016  ? Diabetes mellitus without complication ([place])   ? Gout   ? Hyperlipidemia   ? Hypertension   ? Joint ache   ? Prostate atrophy 2018  ? ?Past Surgical History:  ?Procedure Laterality Date  ? COLON RESECTION  2015  ? HERNIA REPAIR    ? LUMBAR FUSION  1990  ? OTHER SURGICAL HISTORY Left   ? Arm s/p accident  ? REPLACEMENT TOTAL KNEE BILATERAL Bilateral 1990  ? TRANSURETHRAL RESECTION OF PROSTATE N/A 07/04/2017  ? Procedure: TRANSURETHRAL RESECTION OF THE PROSTATE (TURP);  Surgeon: Irine Seal, MD;  Location: [facility];  Service: Urology;  Laterality: N/A;  ? ? ?SOCIAL HISTORY:  ?Social History  ? ?Socioeconomic History  ? Marital status: Married  ?  Spouse name: Not on file  ? Number of children: 2  ? Years of education: 12+  ? Highest education level: Not on file  ?Occupational History  ? Occupation: Retired   ?Tobacco Use  ? Smoking status: Former  ?  Types: Cigarettes  ?  Quit date: 04/28/1992  ?  Years since quitting: 29.4  ? Smokeless tobacco: Never  ?Vaping Use  ? Vaping Use: Never used  ?Substance and Sexual Activity  ? Alcohol use: Yes  ?  Comment: Occasional  ? Drug use: No  ? Sexual activity: Not on file  ?  Comment: Married  ?Other Topics Concern  ? Not on file  ?Social History Narrative  ? Lives at home w/ his wife  ? Right-handed  ? Caffeine: occasional tea  ? ?Social Determinants of Health  ? ?Financial Resource Strain: Not on file  ?Food Insecurity: Not on file  ?Transportation Needs: Not on file  ?Physical Activity: Not on file  ?Stress: Not on file  ?Social Connections: Not on file  ?Intimate Partner Violence: Not on file  ? ? ?FAMILY HISTORY:  ?Family History  ?Problem Relation Age of Onset  ? COPD Father   ? Throat cancer Father   ? Cancer Brother   ? ? ?CURRENT MEDICATIONS:  ?Current Outpatient Medications  ?Medication Sig Dispense Refill  ? acetaminophen (TYLENOL) 500 MG tablet Take by mouth.    ? albuterol (PROVENTIL HFA;VENTOLIN HFA) 108 (90 Base) MCG/ACT inhaler Inhale 2 puffs into the lungs every 6 (six) hours as needed for wheezing or shortness of breath.     ? amLODipine (NORVASC) 5 MG tablet Take  5 mg by mouth daily.    ? b complex vitamins capsule Take by mouth.    ? Blood Glucose Monitoring Suppl ([place]) w/Device KIT daily.    ? glucosamine-chondroitin 500-400 MG tablet Take 1 tablet by mouth daily.    ? Lancets (ONETOUCH DELICA PLUS [id number]) [place] See admin instructions.    ? metoprolol succinate (TOPROL-XL) 100 MG 24 hr tablet Take 100 mg by mouth daily.   1  ? ONETOUCH VERIO test strip 1 each daily.    ? rosuvastatin (CRESTOR) 5 MG tablet Take 5 mg by mouth at bedtime.    ? vitamin C (ASCORBIC ACID) 500 MG tablet Take 1,000 mg by mouth daily.    ? ?No current facility-administered medications for this visit.  ? ? ?ALLERGIES:  ?No Known Allergies ? ?PHYSICAL EXAM:   ?Performance status (ECOG): 1 - Symptomatic but completely ambulatory ? ?There were no vitals filed for this visit. ?Wt Readings from Last 3 Encounters:  ?10/06/21 180 lb 6.4 oz (81.8 kg)  ?07/07/21 188 lb 7.9 oz (85.5 kg)  ?12/22/20 189 lb 9.5 oz (86 kg)  ? ?Physical Exam ?Vitals reviewed.  ?Constitutional:   ?   Appearance: Normal appearance.  ?Cardiovascular:  ?   Rate and Rhythm: Normal rate and regular rhythm.  ?   Pulses: Normal pulses.  ?   Heart sounds: Normal heart sounds.  ?Pulmonary:  ?   Effort: Pulmonary effort is normal.  ?   Breath sounds: Normal breath sounds.  ?Neurological:  ?   General: No focal deficit present.  ?   Mental Status: He is alert and oriented to person, place, and time.  ?Psychiatric:     ?   Mood and Affect: Mood normal.     ?   Behavior: Behavior normal.  ?  ? ?LABORATORY DATA:  ?I have reviewed the labs as listed.  ?CBC Latest Ref Rng & Units 10/14/2021 07/01/2021 12/15/2020  ?WBC 4.0 - 10.5 K/uL 8.8 7.6 8.2  ?Hemoglobin 13.0 - 17.0 g/dL 11.7(L) 13.6 13.7  ?Hematocrit 39.0 - 52.0 % 34.6(L) 37.5(L) 39.8  ?Platelets 150 - 400 K/uL 145(L) 121(L) 154  ? ?CMP Latest Ref Rng & Units 10/14/2021 07/01/2021 12/15/2020  ?Glucose 70 - 99 mg/dL 98 121(H) 128(H)  ?BUN 8 - 23 mg/dL 46(H) 47(H) 37(H)  ?Creatinine 0.61 - 1.24 mg/dL 1.73(H) 1.79(H) 1.38(H)  ?Sodium 135 - 145 mmol/L 138 141 138  ?Potassium 3.5 - 5.1 mmol/L 4.4 4.4 4.2  ?Chloride 98 - 111 mmol/L 108 108 107  ?CO2 22 - 32 mmol/L 23 26 24   ?Calcium 8.9 - 10.3 mg/dL 8.9 9.0 8.8(L)  ?Total Protein 6.5 - 8.1 g/dL 7.5 7.2 7.1  ?Total Bilirubin 0.3 - 1.2 mg/dL 0.6 0.7 1.0  ?Alkaline Phos 38 - 126 U/L 58 47 50  ?AST 15 - 41 U/L 24 17 21   ?ALT 0 - 44 U/L 31 19 30   ? ? ?DIAGNOSTIC IMAGING:  ?I have independently reviewed the scans and discussed with the patient. ?NM PET Image Restag (PS) Skull Base To Thigh ? ?Result Date: 10/18/2021 ?CLINICAL DATA:  Subsequent treatment strategy for lymphadenopathy. EXAM: NUCLEAR MEDICINE PET SKULL BASE TO THIGH  TECHNIQUE: 9.49 mCi F-18 FDG was injected intravenously. Full-ring PET imaging was performed from the skull base to thigh after the radiotracer. CT data was obtained and used for attenuation correction and anatomic localization. Fasting blood glucose: 96 mg/dl COMPARISON:  PET-CT May 28, 2018, CT September 08 2021 FINDINGS: Mediastinal blood  pool activity: SUV max 2.0 Liver activity: SUV max 3.0 NECK: Prominent bilateral cervical lymph nodes measure up to 6 mm in short axis on image 50/3 with a max SUV of 2.3 previously measuring 6 mm without measurable abnormal FDG avidity. Incidental CT findings: Streak artifact from dental hardware. Carotid artery calcifications. CHEST: Low-level hypermetabolic activity in prominent/mildly enlarged mediastinal, hilar and axillary lymph nodes. For reference: -right paratracheal lymph node measures 9 mm in short axis on image 82/3 with a max SUV of 3.4 previously measuring 9 mm with a max SUV of 1.9 -Prevascular lymph node measures 1 cm in short axis with a max SUV of 3.8 previously 7 mm without significant abnormal FDG avidity. - Right axillary lymph node measures 9 mm in short axis on image 100/3 with a max SUV of 2.0 previously measuring 8 mm in short axis with a max SUV of 2.3. Incidental CT findings: Motion degraded examination of the chest. Mosaic attenuation of the lungs. Interval improvement of the segmental pattern of nodularity in the right lower lobe without abnormal FDG avidity, consistent with resolving infectious or inflammatory etiology. Scattered nodular opacities measure up to 5 mm in the right upper lobe on image 38/7, not demonstrating abnormal FDG avidity but below the resolution of PET-CT. ABDOMEN/PELVIS: Low level hypermetabolic activity associated with enlarged portacaval, retroperitoneal, mesenteric, pelvic sidewall, iliac chain and inguinal lymph nodes. For instance: -left periaortic lymph node at the level of the left kidney measures 1.8 cm in short axis  on image 174/3 with a max SUV of 3.2, not significantly changed when remeasured for consistency in comparison to most recent CT measured for consistency and previously measuring 6 mm without significant F

## 2021-10-19 NOTE — Patient Instructions (Signed)
[place] at [facility] ?Discharge Instructions ? ?You were seen and examined today by Dr. Delton Coombes. Dr. Delton Coombes reviewed your recent scan and has recommended a biopsy. You will be referred to [facility] for biopsy, please follow-up one week after biopsy with Dr. Delton Coombes. ? ? ?Thank you for choosing [place] at [facility] to provide your oncology and hematology care.  To afford each patient quality time with our provider, please arrive at least 15 minutes before your scheduled appointment time.  ? ?If you have a lab appointment with the [place] please come in thru the Main Entrance and check in at the main information desk. ? ?You need to re-schedule your appointment should you arrive 10 or more minutes late.  We strive to give you quality time with our providers, and arriving late affects you and other patients whose appointments are after yours.  Also, if you no show three or more times for appointments you may be dismissed from the clinic at the providers discretion.     ?Again, thank you for choosing [facility].  Our hope is that these requests will decrease the amount of time that you wait before being seen by our physicians.       ?_____________________________________________________________ ? ?Should you have questions after your visit to [facility], please contact our office at [phone] and follow the prompts.  Our office hours are 8:00 a.m. and 4:30 p.m. Monday - Friday.  Please note that voicemails left after 4:00 p.m. may not be returned until the following business day.  We are closed weekends and major holidays.  You do have access to a nurse 24-7, just call the main number to the clinic [phone] and do not press any options, hold on the line and a nurse will answer the phone.   ? ?For prescription refill requests, have your pharmacy contact our office and allow 72 hours.   ? ?Due  to Covid, you will need to wear a mask upon entering the hospital. If you do not have a mask, a mask will be given to you at the Main Entrance upon arrival. For doctor visits, patients may have 1 support person age 73 or older with them. For treatment visits, patients can not have anyone with them due to social distancing guidelines and our immunocompromised population.  ? ? ? ?

## 2021-10-26 ENCOUNTER — Other Ambulatory Visit: Payer: Self-pay

## 2021-10-26 ENCOUNTER — Encounter: Payer: Self-pay | Admitting: General Surgery

## 2021-10-26 ENCOUNTER — Ambulatory Visit: Payer: PPO | Admitting: General Surgery

## 2021-10-26 VITALS — BP 148/80 | HR 60 | Temp 97.8°F | Resp 16 | Ht 67.0 in | Wt 178.0 lb

## 2021-10-26 DIAGNOSIS — R591 Generalized enlarged lymph nodes: Secondary | ICD-10-CM

## 2021-10-26 NOTE — Patient Instructions (Signed)
Open Lymph Node Biopsy °An open lymph node biopsy is a procedure to remove a lymph node so that it can be checked for disease. Lymph nodes are part of the body's disease-fighting system (immune system). The immune system protects the body from infections, germs, and diseases. An open lymph node biopsy may be done to: °Look for germs or cancer cells in your lymph node. °Find out why your lymph node is swollen. °Find out more about a condition you have. °Lymph nodes are found in many locations in the body. Biopsies are often done on lymph nodes in the head, neck, armpit, or groin. °Tell a health care provider about: °Any allergies you have. °All medicines you are taking, including vitamins, herbs, eye drops, creams, and over-the-counter medicines. °Any problems you or family members have had with anesthetic medicines. °Any blood disorders you have. °Any surgeries you have had. °Any medical conditions you have or have had. °Whether you are pregnant or may be pregnant. °What are the risks? °Generally, this is a safe procedure. However, problems may occur, including: °Infection. °Bleeding. °Allergic reactions to medicines. °Damage to surrounding structures or organs, such as a nerve. °Scarring. °What happens before the procedure? °Medicines °Ask your health care provider about: °Changing or stopping your regular medicines. This is especially important if you are taking diabetes medicines or blood thinners. °Taking medicines such as aspirin and ibuprofen. These medicines can thin your blood. Do not take these medicines before the procedure unless your health care provider tells you to take them. °Taking over-the-counter medicines, vitamins, herbs, and supplements. °Surgery safety °Ask your health care provider: °How your surgery site will be marked. °What steps will be taken to help prevent infection. These steps may include: °Removing hair at the surgery site. °Washing skin with a germ-killing soap. °Receiving antibiotic  medicine. °General instructions °Follow instructions from your health care provider about eating or drinking restrictions. °You may have an exam or testing. °You may have a blood or urine sample taken. °If you will be going home right after the procedure, plan to have a responsible adult care for you for the time you are told. This is important. °What happens during the procedure? ° °An IV will be inserted into one of your veins. °You will be given one or more of the following: °A medicine to help you relax (sedative). °A medicine to numb the area (local anesthetic). °An incision will be made in the area where your lymph node is located. °Your lymph node will be removed. °Your incision will be closed with stitches (sutures). °An antibiotic ointment may be applied to your incision. °A bandage (dressing) will be placed over your incision. °The procedure may vary among health care providers and hospitals. °What happens after the procedure? °Your blood pressure, heart rate, breathing rate, and blood oxygen level will be monitored until you leave the hospital or clinic. °Do not drive for 24 hours if you were given a sedative during your procedure. °It is up to you to get the results of your procedure. Ask your health care provider, or the department that is doing the procedure, when your results will be ready. °Summary °An open lymph node biopsy is a procedure to remove a lymph node so that it can be checked for disease. °Generally, this is a safe procedure. However, problems may occur, including bleeding, infection, allergic reaction to medicines, and damage to other structures or organs. °During the procedure, an incision will be made in the area of the lymph node, the   lymph node will be removed, and the incision will be closed with sutures. °You will be monitored after the procedure. Do not drive for 24 hours if you were given a sedative during your procedure. °This information is not intended to replace advice given  to you by your health care provider. Make sure you discuss any questions you have with your health care provider. °Document Revised: 04/30/2020 Document Reviewed: 04/30/2020 °Elsevier Patient Education © 2022 Elsevier Inc. ° °

## 2021-10-26 NOTE — Progress Notes (Signed)
[facility] History and Physical ? ?Reason for Referral: Lymphadenopathy ?Referring Physician:  Dr. Delton Coombes ? ?Chief Complaint   ?New Patient (Initial Visit) ?  ? ? ?Kevin Hooper is a 76 y.o. male.  ?HPI: Kevin Hooper is a 76 yo who has had adenopathy for a while (dating back to 2010 at [facility]) but this has been monitored by Dr. Colon Flattery. He has been gradually getting worse with larger nodes and had a CT scan for his kidneys that noted the enlargement he reports. He also had some intermittent hydronephrosis related to the adenopathy and was seen by Dr. Alyson Ingles. He has no B symptoms to really report but does say he gets tachycardic every evening. They have never checked his temperature at those times. He has arthritis and take aleve.  ? ?Dr. Delton Coombes is worried about a low grade lymphoma and is requesting a left inguinal node biopsy after a recent PET showed a 2cm node in that area.  ? ?Past Medical History:  ?Diagnosis Date  ? Arthritis   ? Cataracts, bilateral 11/2016  ? Diabetes mellitus without complication ([place])   ? Gout   ? Hyperlipidemia   ? Hypertension   ? Joint ache   ? Prostate atrophy 2018  ? ? ?Past Surgical History:  ?Procedure Laterality Date  ? COLON RESECTION  2015  ? HERNIA REPAIR    ? LUMBAR FUSION  1990  ? OTHER SURGICAL HISTORY Left   ? Arm s/p accident  ? REPLACEMENT TOTAL KNEE BILATERAL Bilateral 1990  ? TRANSURETHRAL RESECTION OF PROSTATE N/A 07/04/2017  ? Procedure: TRANSURETHRAL RESECTION OF THE PROSTATE (TURP);  Surgeon: Irine Seal, MD;  Location: [facility];  Service: Urology;  Laterality: N/A;  ? ? ?Family History  ?Problem Relation Age of Onset  ? COPD Father   ? Throat cancer Father   ? Cancer Brother   ? ? ?Social History  ? ?Tobacco Use  ? Smoking status: Former  ?  Types: Cigarettes  ?  Quit date: 04/28/1992  ?  Years since quitting: 29.5  ? Smokeless tobacco: Never  ?Vaping Use  ? Vaping Use: Never used  ?Substance Use Topics  ? Alcohol use: Yes  ?  Comment:  Occasional  ? Drug use: No  ? ? ?Medications: I have reviewed the patient's current medications. ?Allergies as of 10/26/2021   ?No Known Allergies ?  ? ?  ?Medication List  ?  ? ?  ? Accurate as of October 26, 2021 11:31 AM. If you have any questions, ask your nurse or doctor.  ?  ?  ? ?  ? ?acetaminophen 500 MG tablet ?Commonly known as: TYLENOL ?Take by mouth. ?  ?albuterol 108 (90 Base) MCG/ACT inhaler ?Commonly known as: VENTOLIN HFA ?Inhale 2 puffs into the lungs every 6 (six) hours as needed for wheezing or shortness of breath. ?  ?amLODipine 5 MG tablet ?Commonly known as: NORVASC ?Take 5 mg by mouth daily. ?  ?b complex vitamins capsule ?Take by mouth. ?  ?glucosamine-chondroitin 500-400 MG tablet ?Take 1 tablet by mouth daily. ?  ?metoprolol succinate 100 MG 24 hr tablet ?Commonly known as: TOPROL-XL ?Take 100 mg by mouth daily. ?  ?OneTouch Delica Plus [id number] Misc ?See admin instructions. ?  ?[employer] w/Device Kit ?daily. ?  ?OneTouch Verio test strip ?Generic drug: glucose blood ?1 each daily. ?  ?rosuvastatin 5 MG tablet ?Commonly known as: CRESTOR ?Take 5 mg by mouth at bedtime. ?  ?vitamin C 500 MG  tablet ?Commonly known as: ASCORBIC ACID ?Take 1,000 mg by mouth daily. ?  ? ?  ? ? ? ?ROS:  ?A comprehensive review of systems was negative except for: Genitourinary: positive for frequency ?Musculoskeletal: positive for joint pain ? ?Blood pressure (!) 148/80, pulse 60, temperature 97.8 ?F (36.6 ?C), temperature source Oral, resp. rate 16, height 5' 7" (1.702 m), weight 178 lb (80.7 kg), SpO2 96 %. ?Physical Exam ?Vitals reviewed.  ?Constitutional:   ?   Appearance: Normal appearance.  ?HENT:  ?   Head: Normocephalic and atraumatic.  ?   Nose: Nose normal.  ?   Mouth/Throat:  ?   Mouth: Mucous membranes are moist.  ?Eyes:  ?   Extraocular Movements: Extraocular movements intact.  ?Cardiovascular:  ?   Rate and Rhythm: Normal rate and regular rhythm.  ?Pulmonary:  ?   Effort: Pulmonary  effort is normal.  ?   Breath sounds: Normal breath sounds.  ?Abdominal:  ?   General: There is no distension.  ?   Palpations: Abdomen is soft.  ?   Tenderness: There is no abdominal tenderness.  ?Musculoskeletal:     ?   General: Normal range of motion.  ?   Cervical back: Normal range of motion.  ?Lymphadenopathy:  ?   Head:  ?   Right side of head: No submandibular adenopathy.  ?   Left side of head: No submandibular adenopathy.  ?   Cervical:  ?   Right cervical: No superficial cervical adenopathy. ?   Left cervical: No superficial cervical adenopathy.  ?   Upper Body:  ?   Right upper body: No supraclavicular or axillary adenopathy.  ?   Left upper body: No supraclavicular or axillary adenopathy.  ?   Lower Body: No right inguinal adenopathy. Left inguinal adenopathy present.  ?   Comments: 2cm superifical node  ?Skin: ?   General: Skin is warm.  ?Neurological:  ?   General: No focal deficit present.  ?   Mental Status: He is alert and oriented to person, place, and time.  ?Psychiatric:     ?   Mood and Affect: Mood normal.     ?   Behavior: Behavior normal.  ? ? ?Results: Personally reviewed large superficial node on the left inguinal region  ?CLINICAL DATA:  Subsequent treatment strategy for lymphadenopathy. ?  ?EXAM: ?NUCLEAR MEDICINE PET SKULL BASE TO THIGH ?  ?TECHNIQUE: ?9.49 mCi F-18 FDG was injected intravenously. Full-ring PET imaging ?was performed from the skull base to thigh after the radiotracer. CT ?data was obtained and used for attenuation correction and anatomic ?localization. ?  ?Fasting blood glucose: 96 mg/dl ?  ?COMPARISON:  PET-CT May 28, 2018, CT September 08 2021 ?  ?FINDINGS: ?Mediastinal blood pool activity: SUV max 2.0 ?  ?Liver activity: SUV max 3.0 ?  ?NECK: Prominent bilateral cervical lymph nodes measure up to 6 mm in ?short axis on image 50/3 with a max SUV of 2.3 previously measuring ?6 mm without measurable abnormal FDG avidity. ?  ?Incidental CT findings: Streak artifact  from dental hardware. ?Carotid artery calcifications. ?  ?CHEST: Low-level hypermetabolic activity in prominent/mildly ?enlarged mediastinal, hilar and axillary lymph nodes. For reference: ?  ?-right paratracheal lymph node measures 9 mm in short axis on image ?82/3 with a max SUV of 3.4 previously measuring 9 mm with a max SUV ?of 1.9 ?  ?-Prevascular lymph node measures 1 cm in short axis with a max SUV ?of 3.8 previously 7  mm without significant abnormal FDG avidity. ?  ?- Right axillary lymph node measures 9 mm in short axis on image ?100/3 with a max SUV of 2.0 previously measuring 8 mm in short axis ?with a max SUV of 2.3. ?  ?Incidental CT findings: Motion degraded examination of the chest. ?Mosaic attenuation of the lungs. Interval improvement of the ?segmental pattern of nodularity in the right lower lobe without ?abnormal FDG avidity, consistent with resolving infectious or ?inflammatory etiology. Scattered nodular opacities measure up to 5 ?mm in the right upper lobe on image 38/7, not demonstrating abnormal ?FDG avidity but below the resolution of PET-CT. ?  ?ABDOMEN/PELVIS: Low level hypermetabolic activity associated with ?enlarged portacaval, retroperitoneal, mesenteric, pelvic sidewall, ?iliac chain and inguinal lymph nodes. For instance: ?  ?-left periaortic lymph node at the level of the left kidney measures ?1.8 cm in short axis on image 174/3 with a max SUV of 3.2, not ?significantly changed when remeasured for consistency in comparison ?to most recent CT measured for consistency and previously measuring ?6 mm without significant FDG avidity on PET-CT May 28, 2018. ?  ?-left external iliac lymph node measures 2.1 cm in short axis on ?image 234/3 with a max SUV of 2.64, not significantly changed in ?size in comparison to most recent CT when remeasured for consistency ?and previously measuring 2.2 cm with a max SUV of 2.7 on PET-CT ?May 28, 2018. ?  ?-left inguinal lymph node measures 2.1  cm in short axis on image ?247/3 with a max SUV of 3.8 previously measuring 9 mm without ?significant FDG avidity on PET-CT May 28, 2018. ?  ?Hazy retroperitoneal and mesenteric stranding with low-level

## 2021-10-27 NOTE — Patient Instructions (Addendum)
? ? ? ? ? ? ? ? Kevin Hooper ? 10/27/2021  ?  ? '@PREFPERIOPPHARMACY'$ @ ? ? Your procedure is scheduled on  11/01/2021. ? ? Report to [facility] at  [place].M. ? ? Call this number if you have problems the morning of surgery: ? [phone] ? ? Remember: ? Do not eat or drink after midnight. ?  ? ?Use your inhaler before you come and bring your rescue inhaler with you. ? ?DO NOT take any medications for diabetes the morning of your procedure. ?  ? ? Take these medicines the morning of surgery with A SIP OF WATER  ? ?amlodipine, metoprolol. ? ?  ? Do not wear jewelry, make-up or nail polish. ? Do not wear lotions, powders, or perfumes, or deodorant. ? Do not shave 48 hours prior to surgery.  Men may shave face and neck. ? Do not bring valuables to the hospital. ? [facility] is not responsible for any belongings or valuables. ? ?Contacts, dentures or bridgework may not be worn into surgery.  Leave your suitcase in the car.  After surgery it may be brought to your room. ? ?For patients admitted to the hospital, discharge time will be determined by your treatment team. ? ?Patients discharged the day of surgery will not be allowed to drive home and must have someone with them for 24 hours.  ? ? ?Special instructions:  DO NOT smoke tobacco or vape for 24 hours before your procedure. ? ?Please read over the following fact sheets that you were given. ?Coughing and Deep Breathing, Surgical Site Infection Prevention, Anesthesia Post-op Instructions, and Care and Recovery After Surgery ? ?  ? ? ? Open Lymph Node Biopsy, Care After ?The following information offers guidance on how to care for yourself after your procedure. Your health care provider may also give you more specific instructions. If you have problems or questions, contact your health care provider. ?What can I expect after the procedure? ?After the procedure, it is common to have: ?Bruising. ?Soreness. ?Mild swelling. ?Follow these instructions at  home: ?Medicines ?Take over-the-counter and prescription medicines only as told by your health care provider. ?If you were prescribed an antibiotic medicine, take or use it as told by your health care provider. Do not stop taking the antibiotic even if you start to feel better. ?Do not drive or use heavy machinery while taking prescription pain medicine. ?Incision care ? ?Follow instructions from your health care provider about how to take care of your incision. Make sure you: ?Wash your hands with soap and water for at least 20 seconds before and after you change your bandage (dressing). If soap and water are not available, use hand sanitizer. ?Change your dressing as told by your health care provider. ?Leave stitches (sutures), skin glue, or adhesive strips in place. These skin closures may need to stay in place for 2 weeks or longer. If adhesive strip edges start to loosen and curl up, you may trim the loose edges. Do not remove adhesive strips completely unless your health care provider tells you to do that. ?Check your incision area every day for signs of infection. Check for: ?More redness, swelling, or pain. ?Fluid or blood. ?Warmth. ?Pus or a bad smell. ?General instructions ?Do not take baths, swim, or use a hot tub until your health care provider approves. Ask your health care provider if you may take showers. You may only be allowed to take sponge baths. ?If you were given a sedative during  the procedure, it can affect you for several hours. Do not drive or operate machinery until your health care provider says that it is safe. ?Return to your normal activities as told by your health care provider. Ask your health care provider what activities are safe for you. ?Keep all follow-up visits. This is important. ?Contact a health care provider if: ?You have a fever. ?You have increased redness, swelling, or pain around your incision. ?You have fluid or blood coming from your incision. ?Your incision feels warm  to the touch. ?You have pus or a bad smell coming from your incision. ?You have pain or numbness that gets worse or lasts longer than a few days. ?Summary ?After a lymph node biopsy, it is common to have bruising, soreness, and mild swelling. ?Follow your health care provider's instructions about taking care of yourself at home. You will be told how to take medicines, take care of your incision, and check for infection. ?Return to your normal activities as told by your health care provider. Ask your health care provider what activities are safe for you. ?Contact a health care provider if you have increased redness, swelling, or pain around your incision, you have a fever, or you have worsening pain or numbness. ?This information is not intended to replace advice given to you by your health care provider. Make sure you discuss any questions you have with your health care provider. ?Document Revised: 04/30/2020 Document Reviewed: 04/30/2020 ?Elsevier Patient Education ? [place]. ?General Anesthesia, Adult, Care After ?This sheet gives you information about how to care for yourself after your procedure. Your health care provider may also give you more specific instructions. If you have problems or questions, contact your health care provider. ?What can I expect after the procedure? ?After the procedure, the following side effects are common: ?Pain or discomfort at the IV site. ?Nausea. ?Vomiting. ?Sore throat. ?Trouble concentrating. ?Feeling cold or chills. ?Feeling weak or tired. ?Sleepiness and fatigue. ?Soreness and body aches. These side effects can affect parts of the body that were not involved in surgery. ?Follow these instructions at home: ?For the time period you were told by your health care provider: ? ?Rest. ?Do not participate in activities where you could fall or become injured. ?Do not drive or use machinery. ?Do not drink alcohol. ?Do not take sleeping pills or medicines that cause  drowsiness. ?Do not make important decisions or sign legal documents. ?Do not take care of children on your own. ?Eating and drinking ?Follow any instructions from your health care provider about eating or drinking restrictions. ?When you feel hungry, start by eating small amounts of foods that are soft and easy to digest (bland), such as toast. Gradually return to your regular diet. ?Drink enough fluid to keep your urine pale yellow. ?If you vomit, rehydrate by drinking water, juice, or clear broth. ?General instructions ?If you have sleep apnea, surgery and certain medicines can increase your risk for breathing problems. Follow instructions from your health care provider about wearing your sleep device: ?Anytime you are sleeping, including during daytime naps. ?While taking prescription pain medicines, sleeping medicines, or medicines that make you drowsy. ?Have a responsible adult stay with you for the time you are told. It is important to have someone help care for you until you are awake and alert. ?Return to your normal activities as told by your health care provider. Ask your health care provider what activities are safe for you. ?Take over-the-counter and prescription medicines only as  told by your health care provider. ?If you smoke, do not smoke without supervision. ?Keep all follow-up visits as told by your health care provider. This is important. ?Contact a health care provider if: ?You have nausea or vomiting that does not get better with medicine. ?You cannot eat or drink without vomiting. ?You have pain that does not get better with medicine. ?You are unable to pass urine. ?You develop a skin rash. ?You have a fever. ?You have redness around your IV site that gets worse. ?Get help right away if: ?You have difficulty breathing. ?You have chest pain. ?You have blood in your urine or stool, or you vomit blood. ?Summary ?After the procedure, it is common to have a sore throat or nausea. It is also common  to feel tired. ?Have a responsible adult stay with you for the time you are told. It is important to have someone help care for you until you are awake and alert. ?When you feel hungry, start by eating small amounts of foods t

## 2021-10-28 ENCOUNTER — Encounter (HOSPITAL_COMMUNITY)
Admission: RE | Admit: 2021-10-28 | Discharge: 2021-10-28 | Disposition: A | Discharge status: Home / Self Care | Payer: PPO | Source: Ambulatory Visit | Attending: General Surgery | Admitting: General Surgery

## 2021-10-28 ENCOUNTER — Encounter (HOSPITAL_COMMUNITY): Payer: Self-pay

## 2021-10-28 ENCOUNTER — Other Ambulatory Visit: Payer: Self-pay

## 2021-10-28 VITALS — Ht 67.0 in | Wt 178.0 lb

## 2021-10-28 DIAGNOSIS — I10 Essential (primary) hypertension: Secondary | ICD-10-CM | POA: Diagnosis not present

## 2021-10-28 DIAGNOSIS — M1990 Unspecified osteoarthritis, unspecified site: Secondary | ICD-10-CM | POA: Diagnosis not present

## 2021-10-28 DIAGNOSIS — E119 Type 2 diabetes mellitus without complications: Secondary | ICD-10-CM

## 2021-10-28 HISTORY — DX: Chronic kidney disease, unspecified: N18.9

## 2021-10-28 NOTE — H&P (Signed)
[facility] History and Physical ?  ?Reason for Referral: Lymphadenopathy ?Referring Physician:  Dr. Delton Coombes ?  ?Chief Complaint   ?New Patient (Initial Visit) ?   ?  ?  ?Kevin Hooper is a 76 y.o. male.  ?HPI: Mr. Marschall is a 76 yo who has had adenopathy for a while (dating back to 2010 at [facility]) but this has been monitored by Dr. Colon Flattery. He has been gradually getting worse with larger nodes and had a CT scan for his kidneys that noted the enlargement he reports. He also had some intermittent hydronephrosis related to the adenopathy and was seen by Dr. Alyson Ingles. He has no B symptoms to really report but does say he gets tachycardic every evening. They have never checked his temperature at those times. He has arthritis and take aleve.  ?  ?Dr. Delton Coombes is worried about a low grade lymphoma and is requesting a left inguinal node biopsy after a recent PET showed a 2cm node in that area.  ?  ?    ?Past Medical History:  ?Diagnosis Date  ? Arthritis    ? Cataracts, bilateral 11/2016  ? Diabetes mellitus without complication ([place])    ? Gout    ? Hyperlipidemia    ? Hypertension    ? Joint ache    ? Prostate atrophy 2018  ?  ?  ?     ?Past Surgical History:  ?Procedure Laterality Date  ? COLON RESECTION   2015  ? HERNIA REPAIR      ? LUMBAR FUSION   1990  ? OTHER SURGICAL HISTORY Left    ?  Arm s/p accident  ? REPLACEMENT TOTAL KNEE BILATERAL Bilateral 1990  ? TRANSURETHRAL RESECTION OF PROSTATE N/A 07/04/2017  ?  Procedure: TRANSURETHRAL RESECTION OF THE PROSTATE (TURP);  Surgeon: Irine Seal, MD;  Location: [facility];  Service: Urology;  Laterality: N/A;  ?  ?  ?     ?Family History  ?Problem Relation Age of Onset  ? COPD Father    ? Throat cancer Father    ? Cancer Brother    ?  ?  ?Social History  ?  ?     ?Tobacco Use  ? Smoking status: Former  ?    Types: Cigarettes  ?    Quit date: 04/28/1992  ?    Years since quitting: 29.5  ? Smokeless tobacco: Never  ?Vaping Use  ? Vaping Use: Never  used  ?Substance Use Topics  ? Alcohol use: Yes  ?    Comment: Occasional  ? Drug use: No  ?  ?  ?Medications: I have reviewed the patient's current medications. ?Allergies as of 10/26/2021   ?No Known Allergies ?   ?  ?   ?Medication List  ?   ?  ?   ? Accurate as of October 26, 2021 11:31 AM. If you have any questions, ask your nurse or doctor.  ?  ?   ?  ?   ?  ?acetaminophen 500 MG tablet ?Commonly known as: TYLENOL ?Take by mouth. ?   ?albuterol 108 (90 Base) MCG/ACT inhaler ?Commonly known as: VENTOLIN HFA ?Inhale 2 puffs into the lungs every 6 (six) hours as needed for wheezing or shortness of breath. ?   ?amLODipine 5 MG tablet ?Commonly known as: NORVASC ?Take 5 mg by mouth daily. ?   ?b complex vitamins capsule ?Take by mouth. ?   ?glucosamine-chondroitin 500-400 MG tablet ?Take 1 tablet by mouth daily. ?   ?  metoprolol succinate 100 MG 24 hr tablet ?Commonly known as: TOPROL-XL ?Take 100 mg by mouth daily. ?   ?OneTouch Delica Plus [id number] Misc ?See admin instructions. ?   ?[employer] w/Device Kit ?daily. ?   ?OneTouch Verio test strip ?Generic drug: glucose blood ?1 each daily. ?   ?rosuvastatin 5 MG tablet ?Commonly known as: CRESTOR ?Take 5 mg by mouth at bedtime. ?   ?vitamin C 500 MG tablet ?Commonly known as: ASCORBIC ACID ?Take 1,000 mg by mouth daily. ?   ?  ?   ?  ?  ?  ?ROS:  ?A comprehensive review of systems was negative except for: Genitourinary: positive for frequency ?Musculoskeletal: positive for joint pain ?  ?Blood pressure (!) 148/80, pulse 60, temperature 97.8 ?F (36.6 ?C), temperature source Oral, resp. rate 16, height 5' 7"  (1.702 m), weight 178 lb (80.7 kg), SpO2 96 %. ?Physical Exam ?Vitals reviewed.  ?Constitutional:   ?   Appearance: Normal appearance.  ?HENT:  ?   Head: Normocephalic and atraumatic.  ?   Nose: Nose normal.  ?   Mouth/Throat:  ?   Mouth: Mucous membranes are moist.  ?Eyes:  ?   Extraocular Movements: Extraocular movements intact.  ?Cardiovascular:   ?   Rate and Rhythm: Normal rate and regular rhythm.  ?Pulmonary:  ?   Effort: Pulmonary effort is normal.  ?   Breath sounds: Normal breath sounds.  ?Abdominal:  ?   General: There is no distension.  ?   Palpations: Abdomen is soft.  ?   Tenderness: There is no abdominal tenderness.  ?Musculoskeletal:     ?   General: Normal range of motion.  ?   Cervical back: Normal range of motion.  ?Lymphadenopathy:  ?   Head:  ?   Right side of head: No submandibular adenopathy.  ?   Left side of head: No submandibular adenopathy.  ?   Cervical:  ?   Right cervical: No superficial cervical adenopathy. ?   Left cervical: No superficial cervical adenopathy.  ?   Upper Body:  ?   Right upper body: No supraclavicular or axillary adenopathy.  ?   Left upper body: No supraclavicular or axillary adenopathy.  ?   Lower Body: No right inguinal adenopathy. Left inguinal adenopathy present.  ?   Comments: 2cm superifical node  ?Skin: ?   General: Skin is warm.  ?Neurological:  ?   General: No focal deficit present.  ?   Mental Status: He is alert and oriented to person, place, and time.  ?Psychiatric:     ?   Mood and Affect: Mood normal.     ?   Behavior: Behavior normal.  ?  ?  ?Results: Personally reviewed large superficial node on the left inguinal region  ?CLINICAL DATA:  Subsequent treatment strategy for lymphadenopathy. ?  ?EXAM: ?NUCLEAR MEDICINE PET SKULL BASE TO THIGH ?  ?TECHNIQUE: ?9.49 mCi F-18 FDG was injected intravenously. Full-ring PET imaging ?was performed from the skull base to thigh after the radiotracer. CT ?data was obtained and used for attenuation correction and anatomic ?localization. ?  ?Fasting blood glucose: 96 mg/dl ?  ?COMPARISON:  PET-CT May 28, 2018, CT September 08 2021 ?  ?FINDINGS: ?Mediastinal blood pool activity: SUV max 2.0 ?  ?Liver activity: SUV max 3.0 ?  ?NECK: Prominent bilateral cervical lymph nodes measure up to 6 mm in ?short axis on image 50/3 with a max SUV of 2.3 previously  measuring ?6  mm without measurable abnormal FDG avidity. ?  ?Incidental CT findings: Streak artifact from dental hardware. ?Carotid artery calcifications. ?  ?CHEST: Low-level hypermetabolic activity in prominent/mildly ?enlarged mediastinal, hilar and axillary lymph nodes. For reference: ?  ?-right paratracheal lymph node measures 9 mm in short axis on image ?82/3 with a max SUV of 3.4 previously measuring 9 mm with a max SUV ?of 1.9 ?  ?-Prevascular lymph node measures 1 cm in short axis with a max SUV ?of 3.8 previously 7 mm without significant abnormal FDG avidity. ?  ?- Right axillary lymph node measures 9 mm in short axis on image ?100/3 with a max SUV of 2.0 previously measuring 8 mm in short axis ?with a max SUV of 2.3. ?  ?Incidental CT findings: Motion degraded examination of the chest. ?Mosaic attenuation of the lungs. Interval improvement of the ?segmental pattern of nodularity in the right lower lobe without ?abnormal FDG avidity, consistent with resolving infectious or ?inflammatory etiology. Scattered nodular opacities measure up to 5 ?mm in the right upper lobe on image 38/7, not demonstrating abnormal ?FDG avidity but below the resolution of PET-CT. ?  ?ABDOMEN/PELVIS: Low level hypermetabolic activity associated with ?enlarged portacaval, retroperitoneal, mesenteric, pelvic sidewall, ?iliac chain and inguinal lymph nodes. For instance: ?  ?-left periaortic lymph node at the level of the left kidney measures ?1.8 cm in short axis on image 174/3 with a max SUV of 3.2, not ?significantly changed when remeasured for consistency in comparison ?to most recent CT measured for consistency and previously measuring ?6 mm without significant FDG avidity on PET-CT May 28, 2018. ?  ?-left external iliac lymph node measures 2.1 cm in short axis on ?image 234/3 with a max SUV of 2.64, not significantly changed in ?size in comparison to most recent CT when remeasured for consistency ?and previously measuring  2.2 cm with a max SUV of 2.7 on PET-CT ?May 28, 2018. ?  ?-left inguinal lymph node measures 2.1 cm in short axis on image ?247/3 with a max SUV of 3.8 previously measuring 9 mm without ?significant FDG avidity

## 2021-11-01 ENCOUNTER — Ambulatory Visit (HOSPITAL_BASED_OUTPATIENT_CLINIC_OR_DEPARTMENT_OTHER): Payer: PPO | Admitting: Anesthesiology

## 2021-11-01 ENCOUNTER — Encounter (HOSPITAL_COMMUNITY)
Admission: RE | Disposition: A | Discharge status: Home / Self Care | Payer: Self-pay | Source: Home / Self Care | Attending: General Surgery

## 2021-11-01 ENCOUNTER — Ambulatory Visit (HOSPITAL_COMMUNITY): Payer: PPO | Admitting: Anesthesiology

## 2021-11-01 ENCOUNTER — Ambulatory Visit (HOSPITAL_COMMUNITY)
Admission: RE | Admit: 2021-11-01 | Discharge: 2021-11-01 | Disposition: A | Discharge status: Home / Self Care | Payer: PPO | Attending: General Surgery | Admitting: General Surgery

## 2021-11-01 ENCOUNTER — Other Ambulatory Visit: Payer: Self-pay

## 2021-11-01 DIAGNOSIS — E1136 Type 2 diabetes mellitus with diabetic cataract: Secondary | ICD-10-CM | POA: Insufficient documentation

## 2021-11-01 DIAGNOSIS — R590 Localized enlarged lymph nodes: Secondary | ICD-10-CM

## 2021-11-01 DIAGNOSIS — I10 Essential (primary) hypertension: Secondary | ICD-10-CM | POA: Insufficient documentation

## 2021-11-01 DIAGNOSIS — M1990 Unspecified osteoarthritis, unspecified site: Secondary | ICD-10-CM | POA: Diagnosis not present

## 2021-11-01 DIAGNOSIS — R591 Generalized enlarged lymph nodes: Secondary | ICD-10-CM | POA: Diagnosis not present

## 2021-11-01 DIAGNOSIS — L918 Other hypertrophic disorders of the skin: Secondary | ICD-10-CM | POA: Insufficient documentation

## 2021-11-01 DIAGNOSIS — Z87891 Personal history of nicotine dependence: Secondary | ICD-10-CM | POA: Diagnosis not present

## 2021-11-01 DIAGNOSIS — C8590 Non-Hodgkin lymphoma, unspecified, unspecified site: Secondary | ICD-10-CM | POA: Insufficient documentation

## 2021-11-01 DIAGNOSIS — C8305 Small cell B-cell lymphoma, lymph nodes of inguinal region and lower limb: Secondary | ICD-10-CM | POA: Diagnosis not present

## 2021-11-01 DIAGNOSIS — N289 Disorder of kidney and ureter, unspecified: Secondary | ICD-10-CM | POA: Insufficient documentation

## 2021-11-01 HISTORY — PX: INGUINAL LYMPH NODE BIOPSY: SHX5865

## 2021-11-01 HISTORY — PX: EXCISION OF SKIN TAG: SHX6270

## 2021-11-01 LAB — GLUCOSE, CAPILLARY: Glucose-Capillary: 134 mg/dL — ABNORMAL HIGH (ref 70–99)

## 2021-11-01 SURGERY — BIOPSY, LYMPH NODE, INGUINAL, OPEN
Anesthesia: General | Site: Thigh | Laterality: Left

## 2021-11-01 MED ORDER — GLYCOPYRROLATE PF 0.2 MG/ML IJ SOSY
PREFILLED_SYRINGE | INTRAMUSCULAR | Status: AC
  Filled 2021-11-01: qty 1

## 2021-11-01 MED ORDER — 0.9 % SODIUM CHLORIDE (POUR BTL) OPTIME
TOPICAL | Status: DC | PRN
  Administered 2021-11-01: 1000 mL

## 2021-11-01 MED ORDER — CHLORHEXIDINE GLUCONATE CLOTH 2 % EX PADS: 6.0000 | MEDICATED_PAD | Freq: Once | CUTANEOUS | Status: DC

## 2021-11-01 MED ORDER — PROPOFOL 10 MG/ML IV BOLUS
INTRAVENOUS | Status: AC
  Filled 2021-11-01: qty 20

## 2021-11-01 MED ORDER — MIDAZOLAM HCL 2 MG/2ML IJ SOLN
INTRAMUSCULAR | Status: DC | PRN
  Administered 2021-11-01: 1 mg via INTRAVENOUS

## 2021-11-01 MED ORDER — CEFAZOLIN SODIUM-DEXTROSE 2-4 GM/100ML-% IV SOLN
2.0000 g | INTRAVENOUS | Status: AC
  Administered 2021-11-01: 2 g via INTRAVENOUS

## 2021-11-01 MED ORDER — PROPOFOL 10 MG/ML IV BOLUS
INTRAVENOUS | Status: DC | PRN
  Administered 2021-11-01: 30 mg via INTRAVENOUS
  Administered 2021-11-01: 170 mg via INTRAVENOUS

## 2021-11-01 MED ORDER — SEVOFLURANE IN SOLN
RESPIRATORY_TRACT | Status: AC
  Filled 2021-11-01: qty 500

## 2021-11-01 MED ORDER — FENTANYL CITRATE (PF) 100 MCG/2ML IJ SOLN
INTRAMUSCULAR | Status: DC | PRN
Start: 2021-11-01 — End: 2021-11-01
  Administered 2021-11-01: 25 ug via INTRAVENOUS
  Administered 2021-11-01: 50 ug via INTRAVENOUS
  Administered 2021-11-01: 25 ug via INTRAVENOUS

## 2021-11-01 MED ORDER — GLYCOPYRROLATE PF 0.2 MG/ML IJ SOSY
PREFILLED_SYRINGE | INTRAMUSCULAR | Status: DC | PRN
  Administered 2021-11-01: .1 mg via INTRAVENOUS

## 2021-11-01 MED ORDER — CEFAZOLIN SODIUM-DEXTROSE 2-4 GM/100ML-% IV SOLN
INTRAVENOUS | Status: AC
  Filled 2021-11-01: qty 100

## 2021-11-01 MED ORDER — OXYCODONE HCL 5 MG PO TABS: 5.0000 mg | ORAL_TABLET | ORAL | 0 refills | Status: DC | PRN

## 2021-11-01 MED ORDER — ONDANSETRON HCL 4 MG/2ML IJ SOLN: 4.0000 mg | Freq: Once | INTRAMUSCULAR | Status: DC | PRN

## 2021-11-01 MED ORDER — MIDAZOLAM HCL 2 MG/2ML IJ SOLN
INTRAMUSCULAR | Status: AC
  Filled 2021-11-01: qty 2

## 2021-11-01 MED ORDER — CHLORHEXIDINE GLUCONATE 0.12 % MT SOLN
15.0000 mL | Freq: Once | OROMUCOSAL | Status: AC
  Administered 2021-11-01: 15 mL via OROMUCOSAL

## 2021-11-01 MED ORDER — LIDOCAINE HCL (CARDIAC) PF 50 MG/5ML IV SOSY
PREFILLED_SYRINGE | INTRAVENOUS | Status: DC | PRN
  Administered 2021-11-01: 80 mg via INTRAVENOUS

## 2021-11-01 MED ORDER — LACTATED RINGERS IV SOLN: INTRAVENOUS | Status: DC

## 2021-11-01 MED ORDER — ORAL CARE MOUTH RINSE: 15.0000 mL | Freq: Once | OROMUCOSAL | Status: AC

## 2021-11-01 MED ORDER — BUPIVACAINE HCL (PF) 0.5 % IJ SOLN
INTRAMUSCULAR | Status: DC | PRN
  Administered 2021-11-01: 10 mL

## 2021-11-01 MED ORDER — ONDANSETRON HCL 4 MG PO TABS: 4.0000 mg | ORAL_TABLET | Freq: Three times a day (TID) | ORAL | 1 refills | Status: DC | PRN

## 2021-11-01 MED ORDER — FENTANYL CITRATE PF 50 MCG/ML IJ SOSY
25.0000 ug | PREFILLED_SYRINGE | INTRAMUSCULAR | Status: DC | PRN
  Administered 2021-11-01: 50 ug via INTRAVENOUS
  Filled 2021-11-01: qty 1

## 2021-11-01 MED ORDER — BUPIVACAINE HCL (PF) 0.5 % IJ SOLN
INTRAMUSCULAR | Status: AC
  Filled 2021-11-01: qty 30

## 2021-11-01 MED ORDER — FENTANYL CITRATE (PF) 100 MCG/2ML IJ SOLN
INTRAMUSCULAR | Status: AC
Start: 2021-11-01 — End: ?
  Filled 2021-11-01: qty 2

## 2021-11-01 SURGICAL SUPPLY — 36 items
ADH SKN CLS APL DERMABOND .7 (GAUZE/BANDAGES/DRESSINGS) ×2
APPLIER CLIP 9.375 SM OPEN (CLIP)
APR CLP SM 9.3 20 MLT OPN (CLIP)
BANDAID FLEXIBLE 1X3 (GAUZE/BANDAGES/DRESSINGS) ×1 IMPLANT
CLIP APPLIE 9.375 SM OPEN (CLIP) IMPLANT
CLOTH BEACON ORANGE TIMEOUT ST (SAFETY) ×4 IMPLANT
CNTNR URN SCR LID CUP LEK RST (MISCELLANEOUS) ×3 IMPLANT
CONT SPEC 4OZ STRL OR WHT (MISCELLANEOUS) ×3
COVER LIGHT HANDLE STERIS (MISCELLANEOUS) ×8 IMPLANT
DECANTER SPIKE VIAL GLASS SM (MISCELLANEOUS) ×4 IMPLANT
DERMABOND ADVANCED (GAUZE/BANDAGES/DRESSINGS) ×1
DERMABOND ADVANCED .7 DNX12 (GAUZE/BANDAGES/DRESSINGS) ×3 IMPLANT
ELECT REM PT RETURN 9FT ADLT (ELECTROSURGICAL) ×3
ELECTRODE REM PT RTRN 9FT ADLT (ELECTROSURGICAL) ×3 IMPLANT
GAUZE 4X4 16PLY ~~LOC~~+RFID DBL (SPONGE) ×1 IMPLANT
GAUZE SPONGE 4X4 12PLY STRL (GAUZE/BANDAGES/DRESSINGS) ×1 IMPLANT
GLOVE SURG ENC MOIS LTX SZ6.5 (GLOVE) ×4 IMPLANT
GLOVE SURG UNDER POLY LF SZ7 (GLOVE) ×13 IMPLANT
GOWN STRL REUS W/TWL LRG LVL3 (GOWN DISPOSABLE) ×8 IMPLANT
KIT TURNOVER KIT A (KITS) ×4 IMPLANT
MANIFOLD NEPTUNE II (INSTRUMENTS) ×4 IMPLANT
NDL HYPO 25X1 1.5 SAFETY (NEEDLE) ×3 IMPLANT
NEEDLE HYPO 25X1 1.5 SAFETY (NEEDLE) ×3 IMPLANT
NS IRRIG 1000ML POUR BTL (IV SOLUTION) ×4 IMPLANT
PACK MINOR (CUSTOM PROCEDURE TRAY) ×4 IMPLANT
PAD ARMBOARD 7.5X6 YLW CONV (MISCELLANEOUS) ×4 IMPLANT
SET BASIN LINEN APH (SET/KITS/TRAYS/PACK) ×4 IMPLANT
SHEARS HARMONIC 9CM CVD (BLADE) IMPLANT
SPONGE INTESTINAL PEANUT (DISPOSABLE) IMPLANT
SUT MNCRL AB 4-0 PS2 18 (SUTURE) ×4 IMPLANT
SUT SILK 2 0 (SUTURE)
SUT SILK 2-0 18XBRD TIE 12 (SUTURE) IMPLANT
SUT VIC AB 3-0 SH 27 (SUTURE) ×3
SUT VIC AB 3-0 SH 27X BRD (SUTURE) ×3 IMPLANT
SUT VICRYL AB 3 0 TIES (SUTURE) ×4 IMPLANT
SYR CONTROL 10ML LL (SYRINGE) ×4 IMPLANT

## 2021-11-01 NOTE — Discharge Instructions (Addendum)
Discharge Instructions: ? ?Common Complaints: ?Pain at the incision site is common.  ?Some nausea is common and poor appetite after anesthesia. The main goal is to stay hydrated the first few days after surgery.  ? ?Diet/ Activity: ?Diet as tolerated. You may not have an appetite, but it is important to stay hydrated. Drink 64 ounces of water a day.  ?Your appetite will return with time.  ?Shower per your regular routine daily.  Do not take hot showers. Take warm showers that are less than 10 minutes. ?Walk everyday for at least 15-20 minutes. Deep cough and move around every 1-2 hours in the first few days after surgery.  ?Limit excessive movement, lifting > 10 lbs, stretching with the limb if there is an incision on your arm/armpit or leg.   ?Limit stretching, pulling on your incision if it is located on other parts of your body.  ?Do not pick at the dermabond glue on your incision sites. This glue film will remain in place for 1-2 weeks and will start to peel off.  ?Remove bandaid from skin tag on 4/5 and replace as needed.  ?Do not place lotions or balms on your incision unless instructed to specifically by Dr. Constance Haw.  ?NO Submerging in the pool until healed, 4 weeks.  ? ?Medication: ?Take tylenol and ibuprofen as needed for pain control, alternating every 4-6 hours.  ?Example:  ?Tylenol '1000mg'$  @ 6am, 12noon, 6pm, 65mdnight (Do not exceed '4000mg'$  of tylenol a day). Ibuprofen '800mg'$  @ 9am, 3pm, 9pm, 3am (Do not exceed '3600mg'$  of ibuprofen a day).  ?Take Roxicodone for breakthrough pain every 4 hours.  ?Take Colace for constipation related to narcotic pain medication. If you do not have a bowel movement in 2 days, take Miralax over the counter.  ?Drink plenty of water to also prevent constipation.  ? ?Contact Information: ?If you have questions or concerns, please call our office, [phone] Monday- Thursday 8AM-5PM and Friday 8AM-12Noon.  ?If it is after hours or on the weekend, please call [facility] Main [facility],  [phone] and ask to speak to the surgeon on call for Dr. [facility]stance [facility]  ? ?

## 2021-11-01 NOTE — Anesthesia Procedure Notes (Signed)
Procedure Name: LMA Insertion ?Date/Time: 11/01/2021 9:03 AM ?Performed by: Vista Deck, CRNA ?Pre-anesthesia Checklist: Patient identified, Patient being monitored, Emergency Drugs available, Timeout performed and Suction available ?Patient Re-evaluated:Patient Re-evaluated prior to induction ?Oxygen Delivery Method: Circle System Utilized ?Preoxygenation: Pre-oxygenation with 100% oxygen ?Induction Type: IV induction ?Ventilation: Mask ventilation without difficulty ?LMA: LMA inserted ?LMA Size: 4.0 ?Number of attempts: 1 ?Placement Confirmation: positive ETCO2 and breath sounds checked- equal and bilateral ?Tube secured with: Tape ?Dental Injury: Teeth and Oropharynx as per pre-operative assessment  ?Comments: LMA inserted by Vania Rea CRNA ? ? ? ? ?

## 2021-11-01 NOTE — Transfer of Care (Signed)
Immediate Anesthesia Transfer of Care Note ? ?Patient: Kevin Hooper ? ?Procedure(s) Performed: INGUINAL LYMPH NODE BIOPSY- LEFT (Left: Inguinal) ?EXCISION OF SKIN TAG (Left: Thigh) ? ?Patient Location: PACU ? ?Anesthesia Type:General ? ?Level of Consciousness: awake and patient cooperative ? ?Airway & Oxygen Therapy: Patient Spontanous Breathing ? ?Post-op Assessment: Report given to RN and Post -op Vital signs reviewed and stable ? ?Post vital signs: Reviewed and stable ? ?Last Vitals:  ?Vitals Value Taken Time  ?BP 144/84 0954  ?Temp 98.1 0954  ?Pulse 71 0954  ?Resp 12 11/01/21 0953  ?SpO2 98 0954  ?Vitals shown include unvalidated device data. ? ?Last Pain:  ?Vitals:  ? 11/01/21 0800  ?PainSc: 0-No pain  ?   ? ?  ? ?Complications: No notable events documented. ?

## 2021-11-01 NOTE — Anesthesia Preprocedure Evaluation (Signed)
Anesthesia Evaluation  ?Patient identified by MRN, date of birth, ID band ?Patient awake ? ? ? ?Reviewed: ?Allergy & Precautions, NPO status , Patient's Chart, lab work & pertinent test results, reviewed documented beta blocker date and time  ? ?Airway ?Mallampati: III ? ?TM Distance: >3 FB ?Neck ROM: Full ? ? ? Dental ? ?(+) Dental Advisory Given, Missing ?  ?Pulmonary ?former smoker,  ?  ?Pulmonary exam normal ?breath sounds clear to auscultation ? ? ? ? ? ? Cardiovascular ?hypertension, Pt. on medications and Pt. on home beta blockers ?Normal cardiovascular exam ?Rhythm:Regular Rate:Normal ? ? ?  ?Neuro/Psych ?negative neurological ROS ? negative psych ROS  ? GI/Hepatic ?negative GI ROS, Neg liver ROS,   ?Endo/Other  ?negative endocrine ROSdiabetes, Well Controlled, Type 2, Oral Hypoglycemic Agents ? Renal/GU ?Renal InsufficiencyRenal disease  ?negative genitourinary ?  ?Musculoskeletal ? ?(+) Arthritis , Osteoarthritis,   ? Abdominal ?  ?Peds ?negative pediatric ROS ?(+)  Hematology ?negative hematology ROS ?(+)   ?Anesthesia Other Findings ? ? Reproductive/Obstetrics ?negative OB ROS ? ?  ? ? ? ? ? ? ? ? ? ? ? ? ? ?  ?  ? ? ? ? ? ? ?Anesthesia Physical ?Anesthesia Plan ? ?ASA: 2 ? ?Anesthesia Plan: General  ? ?Post-op Pain Management: Dilaudid IV  ? ?Induction: Intravenous ? ?PONV Risk Score and Plan: 3 and Ondansetron and Dexamethasone ? ?Airway Management Planned: LMA ? ?Additional Equipment:  ? ?Intra-op Plan:  ? ?Post-operative Plan: Extubation in OR ? ?Informed Consent: I have reviewed the patients History and Physical, chart, labs and discussed the procedure including the risks, benefits and alternatives for the proposed anesthesia with the patient or authorized representative who has indicated his/her understanding and acceptance.  ? ? ? ?Dental advisory given ? ?Plan Discussed with: CRNA and Surgeon ? ?Anesthesia Plan Comments:   ? ? ? ? ?Anesthesia Quick Evaluation ? ?

## 2021-11-01 NOTE — Interval H&P Note (Signed)
History and Physical Interval Note: ? ?11/01/2021 ?8:47 AM ? ?Kevin Hooper  has presented today for surgery, with the diagnosis of LYMPHADENOPATHY- LEFT INGUINAL.  The various methods of treatment have been discussed with the patient and family. After consideration of risks, benefits and other options for treatment, the patient has consented to  Procedure(s): ?INGUINAL LYMPH NODE BIOPSY- LEFT (Left) as a surgical intervention.  The patient's history has been reviewed, patient examined, no change in status, stable for surgery.  I have reviewed the patient's chart and labs.  Questions were answered to the patient's satisfaction.   ? ?Marked. Also wants the tag removed.  ? ?Virl Cagey ? ? ?

## 2021-11-01 NOTE — Progress Notes (Signed)
Pt condition discussed with Dr Charna Elizabeth. Order for EKG and HgbA1c d/c'd.  ?

## 2021-11-01 NOTE — Op Note (Signed)
[facility] ?Operative Note ? ?11/01/21 ? ?Preoperative Diagnosis: Lymphadenopathy, skin tag  ?  ?Postoperative Diagnosis: Same ?  ?Procedure(s) Performed:  Excisional biopsy of left inguinal lymph node, skin tag removal on left thigh  ?  ?Surgeon: Lanell Matar. Constance Haw, MD ?  ?Assistants: No qualified resident was available  ?  ?Anesthesia: General endotracheal ?  ?Anesthesiologist: Dr. Charna Elizabeth ?  ?Specimens: Left inguinal lymph node  ?  ?Estimated Blood Loss: Minimal ?  ?Blood Replacement: None  ?  ?Complications: None  ? ?Wound Class: Clean  ?  ?Operative Indications: Mr. Pellegrin is a 76 yo with lymphadenopathy for years that is getting worse. We were asked to excise a lymph node to get a better idea of his diagnosis with presumption of lymphoma. We discussed risk of bleeding, infection, lymph leak and non-diagnostic results. He also requested to remove a skin tag and discussed additional risk of bleeding.  ? ?Findings: 2cm left inguinal node, skin tag left thigh ?  ?Procedure: The patient was taken to the operating room and placed supine. General endotracheal anesthesia was induced. Intravenous antibiotics were  administered per protocol. The left inguinal and pelvic region were prepared and draped in the usual sterile fashion.  ? ?A vertical incision on the left upper thigh was made over the palpable node. This was carried down into the subcutaneous tissue and the lymph node was excised in its entirety using the harmonic scalpel. The specimen was passed off. The cavity was made hemostatic. The cavity was closed with 3-0 Vicryl interrupted and the skin was closed with 4-0 Monocryl subcuticular and dermabond.  ? ?The skin tag was prepped with betadine and removed with cautery. This was covered with a bandaid.  ? ?Final inspection revealed acceptable hemostasis. All counts were correct at the end of the case. The patient was awakened from anesthesia and extubated without complication.  The patient  went to the PACU in stable condition. ?  ?Curlene Labrum, MD ?[facility] ?[place], [place] [address] ?[phone] (office) ?  ?

## 2021-11-01 NOTE — Anesthesia Postprocedure Evaluation (Signed)
Anesthesia Post Note ? ?Patient: Kevin Hooper ? ?Procedure(s) Performed: INGUINAL LYMPH NODE BIOPSY- LEFT (Left: Inguinal) ?EXCISION OF SKIN TAG (Left: Thigh) ? ?Patient location during evaluation: Phase II ?Anesthesia Type: General ?Level of consciousness: awake and alert and oriented ?Pain management: pain level controlled ?Vital Signs Assessment: post-procedure vital signs reviewed and stable ?Respiratory status: spontaneous breathing, nonlabored ventilation and respiratory function stable ?Cardiovascular status: blood pressure returned to baseline and stable ?Postop Assessment: no apparent nausea or vomiting ?Anesthetic complications: no ? ? ?No notable events documented. ? ? ?Last Vitals:  ?Vitals:  ? 11/01/21 1015 11/01/21 1027  ?BP: (!) 143/92 (!) 160/97  ?Pulse: 62 65  ?Resp: 15 17  ?Temp:  36.7 ?C  ?SpO2: 97% 99%  ?  ?Last Pain:  ?Vitals:  ? 11/01/21 1027  ?PainSc: 5   ? ? ?  ?  ?  ?  ?  ?  ? ?Tatijana Bierly C Violette Morneault ? ? ? ? ?

## 2021-11-01 NOTE — Progress Notes (Signed)
[facility] ? ?Discussed excision with wife and patient. Can shower. No submerging until healed. Light exercising is ok, do not stretch the incision.  ? ?Roxi and zofran to [facility] [address].  ? ?Will see in a few weeks and will call with results. ? ?Curlene Labrum, MD ?[facility] ?[place], [place] [address] ?[phone] (office) ? ?

## 2021-11-03 ENCOUNTER — Encounter (HOSPITAL_COMMUNITY): Payer: Self-pay | Admitting: General Surgery

## 2021-11-03 LAB — SURGICAL PATHOLOGY

## 2021-11-04 ENCOUNTER — Telehealth (INDEPENDENT_AMBULATORY_CARE_PROVIDER_SITE_OTHER): Payer: PPO | Admitting: General Surgery

## 2021-11-04 DIAGNOSIS — R591 Generalized enlarged lymph nodes: Secondary | ICD-10-CM

## 2021-11-04 MED ORDER — OXYCODONE HCL 5 MG PO TABS: 5.0000 mg | ORAL_TABLET | ORAL | 0 refills | Status: DC | PRN

## 2021-11-04 NOTE — Telephone Encounter (Signed)
[facility] ? ?Updated wife that the the inguinal node came back with lymph.  ? ?FINAL MICROSCOPIC DIAGNOSIS:  ? ?A. LYMPH NODE, INGUINAL, LEFT, EXCISION:  ?-Small lymphocytic lymphoma  ?-See comment  ? ?Updated Dr. Delton Coombes. ?Refilled Roxicodone. ?Will see 4/20  ? ?Curlene Labrum, MD ?[facility] ?[place], [place] [address] ?[phone] (office) ? ?

## 2021-11-09 DIAGNOSIS — R809 Proteinuria, unspecified: Secondary | ICD-10-CM | POA: Diagnosis not present

## 2021-11-09 DIAGNOSIS — D638 Anemia in other chronic diseases classified elsewhere: Secondary | ICD-10-CM | POA: Diagnosis not present

## 2021-11-09 DIAGNOSIS — E871 Hypo-osmolality and hyponatremia: Secondary | ICD-10-CM | POA: Diagnosis not present

## 2021-11-09 DIAGNOSIS — N1832 Chronic kidney disease, stage 3b: Secondary | ICD-10-CM | POA: Diagnosis not present

## 2021-11-09 DIAGNOSIS — Z6827 Body mass index (BMI) 27.0-27.9, adult: Secondary | ICD-10-CM | POA: Diagnosis not present

## 2021-11-10 ENCOUNTER — Inpatient Hospital Stay (HOSPITAL_COMMUNITY): Payer: PPO | Attending: Hematology | Admitting: Hematology

## 2021-11-10 ENCOUNTER — Inpatient Hospital Stay (HOSPITAL_COMMUNITY): Payer: PPO

## 2021-11-10 VITALS — BP 137/79 | HR 70 | Temp 97.5°F | Resp 18 | Ht 67.0 in | Wt 184.1 lb

## 2021-11-10 DIAGNOSIS — R591 Generalized enlarged lymph nodes: Secondary | ICD-10-CM | POA: Diagnosis not present

## 2021-11-10 DIAGNOSIS — N189 Chronic kidney disease, unspecified: Secondary | ICD-10-CM | POA: Insufficient documentation

## 2021-11-10 DIAGNOSIS — Z87891 Personal history of nicotine dependence: Secondary | ICD-10-CM | POA: Diagnosis not present

## 2021-11-10 DIAGNOSIS — Z808 Family history of malignant neoplasm of other organs or systems: Secondary | ICD-10-CM | POA: Insufficient documentation

## 2021-11-10 DIAGNOSIS — D631 Anemia in chronic kidney disease: Secondary | ICD-10-CM | POA: Insufficient documentation

## 2021-11-10 DIAGNOSIS — C8300 Small cell B-cell lymphoma, unspecified site: Secondary | ICD-10-CM | POA: Diagnosis not present

## 2021-11-10 LAB — COMPREHENSIVE METABOLIC PANEL
ALT: 23 U/L (ref 0–44)
AST: 21 U/L (ref 15–41)
Albumin: 3.2 g/dL — ABNORMAL LOW (ref 3.5–5.0)
Alkaline Phosphatase: 57 U/L (ref 38–126)
Anion gap: 4 — ABNORMAL LOW (ref 5–15)
BUN: 31 mg/dL — ABNORMAL HIGH (ref 8–23)
CO2: 23 mmol/L (ref 22–32)
Calcium: 8.5 mg/dL — ABNORMAL LOW (ref 8.9–10.3)
Chloride: 109 mmol/L (ref 98–111)
Creatinine, Ser: 1.6 mg/dL — ABNORMAL HIGH (ref 0.61–1.24)
GFR, Estimated: 45 mL/min — ABNORMAL LOW (ref >60)
Glucose, Bld: 135 mg/dL — ABNORMAL HIGH (ref 70–99)
Potassium: 4.2 mmol/L (ref 3.5–5.1)
Sodium: 136 mmol/L (ref 135–145)
Total Bilirubin: 0.7 mg/dL (ref 0.3–1.2)
Total Protein: 7 g/dL (ref 6.5–8.1)

## 2021-11-10 LAB — HEPATITIS B CORE ANTIBODY, TOTAL: Hep B Core Total Ab: NONREACTIVE

## 2021-11-10 LAB — IRON AND TIBC
Iron: 85 ug/dL (ref 45–182)
Saturation Ratios: 31 % (ref 17.9–39.5)
TIBC: 278 ug/dL (ref 250–450)
UIBC: 193 ug/dL

## 2021-11-10 LAB — FERRITIN: Ferritin: 72 ng/mL (ref 24–336)

## 2021-11-10 LAB — HEPATITIS C ANTIBODY: HCV Ab: NONREACTIVE

## 2021-11-10 LAB — HEPATITIS B SURFACE ANTIBODY,QUALITATIVE: Hep B S Ab: REACTIVE — AB

## 2021-11-10 LAB — HEPATITIS B SURFACE ANTIGEN: Hepatitis B Surface Ag: NONREACTIVE

## 2021-11-10 LAB — URIC ACID: Uric Acid, Serum: 8.3 mg/dL (ref 3.7–8.6)

## 2021-11-10 LAB — LACTATE DEHYDROGENASE: LDH: 147 U/L (ref 98–192)

## 2021-11-10 MED ORDER — ZANUBRUTINIB 80 MG PO CAPS
160.0000 mg | ORAL_CAPSULE | Freq: Two times a day (BID) | ORAL | 1 refills | Status: DC
  Filled 2021-11-10: qty 120, fill #0
  Filled 2021-11-16: qty 120, 30d supply, fill #0
  Filled 2021-12-06: qty 120, 30d supply, fill #1

## 2021-11-10 NOTE — Progress Notes (Signed)
? ?[facility] ?[address] ?[place], [place] [address] ? ? ?CLINIC:  ?Medical Oncology/Hematology ? ?PCP:  ?Monico Blitz, MD ?[address] [place] [address] ?[phone] ? ? ?REASON FOR VISIT:  ?Follow-up for small lymphocytic lymphoma ? ?PRIOR THERAPY: none ? ?NGS Results: not done ? ?CURRENT THERAPY: under work-up ? ?BRIEF ONCOLOGIC HISTORY:  ?Oncology History  ? No history exists.  ? ? ?CANCER STAGING: ? Cancer Staging  ?No matching staging information was found for the patient. ? ?INTERVAL HISTORY:  ?Kevin Hooper, a 76 y.o. male, returns for routine follow-up of his non-Hodgkin's lymphoma. Kevin Hooper was last seen on 10/19/2021.  ? ?Today he reports feeling good. He reports soreness in his leg at the biopsy site with movement.  ? ?REVIEW OF SYSTEMS:  ?Review of Systems  ?Constitutional:  Negative for appetite change and fatigue.  ?Musculoskeletal:  Positive for arthralgias (leg).  ?All other systems reviewed and are negative. ? ?PAST MEDICAL/SURGICAL HISTORY:  ?Past Medical History:  ?Diagnosis Date  ? Arthritis   ? Cataracts, bilateral 11/2016  ? Chronic kidney disease   ? sTAGE 3  ? Diabetes mellitus without complication ([place])   ? Gout   ? Hyperlipidemia   ? Hypertension   ? Joint ache   ? Prostate atrophy 2018  ? ?Past Surgical History:  ?Procedure Laterality Date  ? COLON RESECTION  2015  ? EXCISION OF SKIN TAG Left 11/01/2021  ? Procedure: EXCISION OF SKIN TAG;  Surgeon: Virl Cagey, MD;  Location: [facility];  Service: General;  Laterality: Left;  ? HERNIA REPAIR    ? UMBILICAL  ? INGUINAL LYMPH NODE BIOPSY Left 11/01/2021  ? Procedure: INGUINAL LYMPH NODE BIOPSY- LEFT;  Surgeon: Virl Cagey, MD;  Location: [facility];  Service: General;  Laterality: Left;  ? [place]  ? OTHER SURGICAL HISTORY Left   ? Arm s/p accident  ? REPLACEMENT TOTAL KNEE BILATERAL Bilateral 1990  ? TRANSURETHRAL RESECTION OF PROSTATE N/A 07/04/2017  ? Procedure: TRANSURETHRAL RESECTION OF THE PROSTATE  (TURP);  Surgeon: Irine Seal, MD;  Location: [facility];  Service: Urology;  Laterality: N/A;  ? ? ?SOCIAL HISTORY:  ?Social History  ? ?Socioeconomic History  ? Marital status: Married  ?  Spouse name: Not on file  ? Number of children: 2  ? Years of education: 12+  ? Highest education level: Not on file  ?Occupational History  ? Occupation: Retired  ?Tobacco Use  ? Smoking status: Former  ?  Packs/day: 2.00  ?  Years: 25.00  ?  Pack years: 50.00  ?  Types: Cigarettes  ?  Quit date: 04/28/1992  ?  Years since quitting: 29.5  ? Smokeless tobacco: Never  ?Vaping Use  ? Vaping Use: Never used  ?Substance and Sexual Activity  ? Alcohol use: Yes  ?  Comment: Occasional  ? Drug use: No  ? Sexual activity: Not on file  ?  Comment: Married  ?Other Topics Concern  ? Not on file  ?Social History Narrative  ? Lives at home w/ his wife  ? Right-handed  ? Caffeine: occasional tea  ? ?Social Determinants of Health  ? ?Financial Resource Strain: Not on file  ?Food Insecurity: Not on file  ?Transportation Needs: Not on file  ?Physical Activity: Not on file  ?Stress: Not on file  ?Social Connections: Not on file  ?Intimate Partner Violence: Not on file  ? ? ?FAMILY HISTORY:  ?Family History  ?Problem  Relation Age of Onset  ? COPD Father   ? Throat cancer Father   ? Cancer Brother   ? ? ?CURRENT MEDICATIONS:  ?Current Outpatient Medications  ?Medication Sig Dispense Refill  ? acetaminophen (TYLENOL) 650 MG CR tablet Take 650 mg by mouth every 8 (eight) hours as needed (Arthritis).    ? albuterol (PROVENTIL HFA;VENTOLIN HFA) 108 (90 Base) MCG/ACT inhaler Inhale 2 puffs into the lungs every 6 (six) hours as needed for wheezing or shortness of breath.     ? amLODipine (NORVASC) 5 MG tablet Take 5 mg by mouth daily.    ? b complex vitamins capsule Take 1 capsule by mouth daily. Folic acid    ? Blood Glucose Monitoring Suppl ([place] FLEX SYSTEM) w/Device KIT daily.    ? glucosamine-chondroitin 500-400 MG tablet Take 1 tablet by mouth  daily.    ? Lancets (ONETOUCH DELICA PLUS [id number]) [place] See admin instructions.    ? metoprolol succinate (TOPROL-XL) 100 MG 24 hr tablet Take 100 mg by mouth daily.   1  ? Multiple Vitamins-Minerals (MULTIVITAMIN WITH MINERALS) tablet Take 1 tablet by mouth daily.    ? ONETOUCH VERIO test strip 1 each daily.    ? rosuvastatin (CRESTOR) 5 MG tablet Take 5 mg by mouth at bedtime.    ? vitamin C (ASCORBIC ACID) 500 MG tablet Take 1,000 mg by mouth daily.    ? ?No current facility-administered medications for this visit.  ? ? ?ALLERGIES:  ?No Known Allergies ? ?PHYSICAL EXAM:  ?Performance status (ECOG): 1 - Symptomatic but completely ambulatory ? ?Vitals:  ? 11/10/21 1135  ?BP: 137/79  ?Pulse: 70  ?Resp: 18  ?Temp: (!) 97.5 ?F (36.4 ?C)  ?SpO2: 97%  ? ?Wt Readings from Last 3 Encounters:  ?11/10/21 184 lb 1.4 oz (83.5 kg)  ?10/28/21 178 lb (80.7 kg)  ?10/26/21 178 lb (80.7 kg)  ? ?Physical Exam ?Vitals reviewed.  ?Constitutional:   ?   Appearance: Normal appearance.  ?Cardiovascular:  ?   Rate and Rhythm: Normal rate and regular rhythm.  ?   Pulses: Normal pulses.  ?   Heart sounds: Normal heart sounds.  ?Pulmonary:  ?   Effort: Pulmonary effort is normal.  ?   Breath sounds: Normal breath sounds.  ?Neurological:  ?   General: No focal deficit present.  ?   Mental Status: He is alert and oriented to person, place, and time.  ?Psychiatric:     ?   Mood and Affect: Mood normal.     ?   Behavior: Behavior normal.  ?  ? ?LABORATORY DATA:  ?I have reviewed the labs as listed.  ? ?  Latest Ref Rng & Units 10/14/2021  ?  1:48 PM 07/01/2021  ?  1:19 PM 12/15/2020  ? 12:11 PM  ?CBC  ?WBC 4.0 - 10.5 K/uL 8.8   7.6   8.2    ?Hemoglobin 13.0 - 17.0 g/dL 11.7   13.6   13.7    ?Hematocrit 39.0 - 52.0 % 34.6   37.5   39.8    ?Platelets 150 - 400 K/uL 145   121   154    ? ? ?  Latest Ref Rng & Units 10/14/2021  ?  1:48 PM 07/01/2021  ?  1:19 PM 12/15/2020  ? 12:11 PM  ?CMP  ?Glucose 70 - 99 mg/dL 98   121   128    ?BUN 8 - 23 mg/dL 46    47  37    ?Creatinine 0.61 - 1.24 mg/dL 1.73   1.79   1.38    ?Sodium 135 - 145 mmol/L 138   141   138    ?Potassium 3.5 - 5.1 mmol/L 4.4   4.4   4.2    ?Chloride 98 - 111 mmol/L 108   108   107    ?CO2 22 - 32 mmol/L 23   26   24     ?Calcium 8.9 - 10.3 mg/dL 8.9   9.0   8.8    ?Total Protein 6.5 - 8.1 g/dL 7.5   7.2   7.1    ?Total Bilirubin 0.3 - 1.2 mg/dL 0.6   0.7   1.0    ?Alkaline Phos 38 - 126 U/L 58   47   50    ?AST 15 - 41 U/L 24   17   21     ?ALT 0 - 44 U/L 31   19   30     ? ? ?DIAGNOSTIC IMAGING:  ?I have independently reviewed the scans and discussed with the patient. ?NM PET Image Restag (PS) Skull Base To Thigh ? ?Result Date: 10/18/2021 ?CLINICAL DATA:  Subsequent treatment strategy for lymphadenopathy. EXAM: NUCLEAR MEDICINE PET SKULL BASE TO THIGH TECHNIQUE: 9.49 mCi F-18 FDG was injected intravenously. Full-ring PET imaging was performed from the skull base to thigh after the radiotracer. CT data was obtained and used for attenuation correction and anatomic localization. Fasting blood glucose: 96 mg/dl COMPARISON:  PET-CT May 28, 2018, CT September 08 2021 FINDINGS: Mediastinal blood pool activity: SUV max 2.0 Liver activity: SUV max 3.0 NECK: Prominent bilateral cervical lymph nodes measure up to 6 mm in short axis on image 50/3 with a max SUV of 2.3 previously measuring 6 mm without measurable abnormal FDG avidity. Incidental CT findings: Streak artifact from dental hardware. Carotid artery calcifications. CHEST: Low-level hypermetabolic activity in prominent/mildly enlarged mediastinal, hilar and axillary lymph nodes. For reference: -right paratracheal lymph node measures 9 mm in short axis on image 82/3 with a max SUV of 3.4 previously measuring 9 mm with a max SUV of 1.9 -Prevascular lymph node measures 1 cm in short axis with a max SUV of 3.8 previously 7 mm without significant abnormal FDG avidity. - Right axillary lymph node measures 9 mm in short axis on image 100/3 with a max SUV of  2.0 previously measuring 8 mm in short axis with a max SUV of 2.3. Incidental CT findings: Motion degraded examination of the chest. Mosaic attenuation of the lungs. Interval improvement of the segment

## 2021-11-10 NOTE — Patient Instructions (Addendum)
[place] at [facility] ?Discharge Instructions ? ? ?You were seen and examined today by Dr. Delton Coombes. ? ?He reviewed the results of your biopsy which shows small lymphocytic lymphoma. We treat this condition if it starts causing trouble to any of your organs. Dr. Raliegh Ip thinks this condition is contributing to your kidney damage. Therefore we should start treatment.  ? ?Treatment is in the form of a pill.  He discussed 2 different pills for treatment. He will send prescription for one of these medications later today. These pills come from a specialty pharmacy and will be delivered to your home.  Someone will call you to set up delivery of this drug. Someone has to be present at home to sign for these drugs. He has also requested additional testing on your biopsy. ? ?Return as scheduled.  ? ? ?Thank you for choosing [place] at [facility] to provide your oncology and hematology care.  To afford each patient quality time with our provider, please arrive at least 15 minutes before your scheduled appointment time.  ? ?If you have a lab appointment with the [facility] please come in thru the Main Entrance and check in at the main information desk. ? ?You need to re-schedule your appointment should you arrive 10 or more minutes late.  We strive to give you quality time with our providers, and arriving late affects you and other patients whose appointments are after yours.  Also, if you no show three or more times for appointments you may be dismissed from the clinic at the providers discretion.     ?Again, thank you for choosing [facility].  Our hope is that these requests will decrease the amount of time that you wait before being seen by our physicians.       ?_____________________________________________________________ ? ?Should you have questions after your visit to [facility], please contact our office at [phone] and follow the  prompts.  Our office hours are 8:00 a.m. and 4:30 p.m. Monday - Friday.  Please note that voicemails left after 4:00 p.m. may not be returned until the following business day.  We are closed weekends and major holidays.  You do have access to a nurse 24-7, just call the main number to the clinic [phone] and do not press any options, hold on the line and a nurse will answer the phone.   ? ?For prescription refill requests, have your pharmacy contact our office and allow 72 hours.   ? ?Due to Covid, you will need to wear a mask upon entering the hospital. If you do not have a mask, a mask will be given to you at the Main Entrance upon arrival. For doctor visits, patients may have 1 support person age 77 or older with them. For treatment visits, patients can not have anyone with them due to social distancing guidelines and our immunocompromised population.  ? ?   ?

## 2021-11-11 ENCOUNTER — Telehealth (HOSPITAL_COMMUNITY): Payer: Self-pay | Admitting: Pharmacy Technician

## 2021-11-11 ENCOUNTER — Other Ambulatory Visit (HOSPITAL_COMMUNITY): Payer: Self-pay

## 2021-11-11 ENCOUNTER — Telehealth: Payer: Self-pay | Admitting: Pharmacist

## 2021-11-11 DIAGNOSIS — Z299 Encounter for prophylactic measures, unspecified: Secondary | ICD-10-CM | POA: Diagnosis not present

## 2021-11-11 DIAGNOSIS — Z0000 Encounter for general adult medical examination without abnormal findings: Secondary | ICD-10-CM | POA: Diagnosis not present

## 2021-11-11 DIAGNOSIS — I10 Essential (primary) hypertension: Secondary | ICD-10-CM | POA: Diagnosis not present

## 2021-11-11 DIAGNOSIS — Z1331 Encounter for screening for depression: Secondary | ICD-10-CM | POA: Diagnosis not present

## 2021-11-11 DIAGNOSIS — Z1339 Encounter for screening examination for other mental health and behavioral disorders: Secondary | ICD-10-CM | POA: Diagnosis not present

## 2021-11-11 DIAGNOSIS — R591 Generalized enlarged lymph nodes: Secondary | ICD-10-CM

## 2021-11-11 DIAGNOSIS — C8590 Non-Hodgkin lymphoma, unspecified, unspecified site: Secondary | ICD-10-CM | POA: Diagnosis not present

## 2021-11-11 DIAGNOSIS — Z7189 Other specified counseling: Secondary | ICD-10-CM | POA: Diagnosis not present

## 2021-11-11 DIAGNOSIS — Z6829 Body mass index (BMI) 29.0-29.9, adult: Secondary | ICD-10-CM | POA: Diagnosis not present

## 2021-11-11 DIAGNOSIS — C8300 Small cell B-cell lymphoma, unspecified site: Secondary | ICD-10-CM

## 2021-11-11 NOTE — Telephone Encounter (Signed)
Oral Oncology Pharmacy Student Encounter ? ?Received new prescription for zanubrutinib (Brukinsa) for the treatment of Stage III small lymphocytic lymphoma, planned duration until disease progression or unacceptable toxicity. ? ?CBC from 10/14/21 assessed, baseline anemia and thrombocytopenia were noted. CMP from 11/10/21 assessed, elevated SCr with CrCl of 42.5 mL/min. Will continue to monitor relevant labs. Prescription dose and frequency assessed.  ? ?Current medication list in Epic reviewed, no current DDI identified. ? ?Evaluated chart and no patient barriers to medication adherence identified.  ? ?Prescription has been e-scribed to the [facility] for benefits analysis and approval. ? ?Oral Oncology Clinic will continue to follow for insurance authorization, copayment issues, initial counseling and start date. ? ?Patient agreed to treatment on 11/10/21 per MD documentation. ? ?Adele Dan, PharmD Candidate ?Class of 2023 ?/DB/[facility] Oral Chemotherapy Navigation Clinic ?[phone] ? ?11/11/2021 2:04 PM  ?

## 2021-11-11 NOTE — Telephone Encounter (Signed)
Oral Oncology Patient Advocate Encounter ?  ?Received notification from [employer] that prior authorization for Brukinsa is required. ?  ?PA submitted on CoverMyMeds ?Key [id number] ?Status is pending ?  ?Oral Oncology Clinic will continue to follow. ? ?Dennison Nancy CPHT ?Specialty Pharmacy Patient Advocate ?[place] ?Phone [phone] ?Fax [phone] ?11/11/2021 1:12 PM ? ?

## 2021-11-12 ENCOUNTER — Telehealth (HOSPITAL_COMMUNITY): Payer: Self-pay | Admitting: Pharmacy Technician

## 2021-11-12 ENCOUNTER — Other Ambulatory Visit (HOSPITAL_COMMUNITY): Payer: Self-pay

## 2021-11-12 NOTE — Telephone Encounter (Signed)
Oral Oncology Patient Advocate Encounter ?  ?Was successful in securing patient an $61 grant from Patient [facility]) to provide copayment coverage for Brukinsa.  This will keep the out of pocket expense at $0.   ? ?The billing information is as follows and has been shared with [place].  ? ?Member ID: [id number] ?Group ID: [id number] ?RxBin: [id number] ?Dates of Eligibility: 08/14/21 through 11/12/22 ? ?Fund:  CLL/SLL ? ?Dennison Nancy CPHT ?Specialty Pharmacy Patient Advocate ?[place] ?Phone [phone] ?Fax [phone] ?11/12/2021 10:45 AM ? ? ? ?

## 2021-11-12 NOTE — Telephone Encounter (Signed)
Oral Oncology Patient Advocate Encounter ? ?Prior Authorization for Brukinsa has been approved.   ? ?PA# [id number] ?Effective dates: 11/11/21 through 11/12/22 ? ?Patients co-pay is $3226.23 ? ?Oral Oncology Clinic will continue to follow.  ? ?Dennison Nancy CPHT ?Specialty Pharmacy Patient Advocate ?[place] ?Phone [phone] ?Fax [phone] ?11/12/2021 3:04 PM ? ?

## 2021-11-16 ENCOUNTER — Other Ambulatory Visit (HOSPITAL_COMMUNITY): Payer: Self-pay

## 2021-11-16 NOTE — Telephone Encounter (Signed)
Oral Chemotherapy Pharmacy Student Encounter ?[facility] will deliver [place] to patient on Thursday, 11/18/21. ? ?Patient Education ?I spoke with patient and his wife for overview of new oral chemotherapy medication: zanubrutinib (Brukinsa) for the treatment of Stage III SLL, planned duration until disease progression or unacceptable toxicity.  ? ?Pt is doing well. Counseled patient on administration, dosing, side effects, monitoring, drug-food interactions, safe handling, storage, and disposal. ?Patient will take 2 tablets (160 mg) by mouth twice daily. ? ?Patient reported taking MegaRed Omega-3 Krill Oil daily. Medication list is updated and no drug-drug interaction is identified. ? ?Side effects include but not limited to: decreased WBC, decreased PLT, decreased Hgb, infection, rash or itchy skin, muscle or joint pain or weakness, and fatigue.   ?Respiratory tract infection: Patient's wife asked for advice if anyone around him is feeling sick. Advised to take extra precautions and avoid being around family or friends who are feeling sick. ?Rash or itchy skin: Patient's wife mentioned that Mr. Krotz sometimes swims in an outside pool. Advised to wear a waterproof sunscreen with at least SPF 30, and use an inside pool for the first month after Brukinsa initiation. Also advised to wear long-sleeve clothing, use hats, and wear sunscreen if he goes outside. Encouraged to keep skin moisturized with lotion. ?Muscle or joint pain or weakness: Patient has baseline arthritis and uses Tylenol at arthritis strength (650 mg every 8 hours as needed) for pain relief. Advised patient to contact office if joint pain gets worse with Brukinsa. ? ?Reviewed with patient importance of keeping a medication schedule and plan for any missed doses. ? ?After discussion with patient no patient barrier to medication adherence identified.  ? ?Mr. Duddy voiced understanding and appreciation. All questions answered.  Medication handout provided. ? ?Provided patient with [facility] phone number. Patient knows to call the office with questions or concerns. Oral Chemotherapy Navigation Clinic will continue to follow. ? ?Adele Dan, PharmD Candidate ?Class of 2023 ?[place]/DB/[facility] Oral Chemotherapy Navigation Clinic ?[phone] ? ?11/16/2021 10:43 AM  ?

## 2021-11-17 ENCOUNTER — Other Ambulatory Visit (HOSPITAL_COMMUNITY): Payer: Self-pay

## 2021-11-17 DIAGNOSIS — R809 Proteinuria, unspecified: Secondary | ICD-10-CM | POA: Diagnosis not present

## 2021-11-17 DIAGNOSIS — D696 Thrombocytopenia, unspecified: Secondary | ICD-10-CM | POA: Diagnosis not present

## 2021-11-17 DIAGNOSIS — N1832 Chronic kidney disease, stage 3b: Secondary | ICD-10-CM | POA: Diagnosis not present

## 2021-11-17 DIAGNOSIS — C8300 Small cell B-cell lymphoma, unspecified site: Secondary | ICD-10-CM | POA: Diagnosis not present

## 2021-11-17 DIAGNOSIS — D638 Anemia in other chronic diseases classified elsewhere: Secondary | ICD-10-CM | POA: Diagnosis not present

## 2021-11-17 DIAGNOSIS — N170 Acute kidney failure with tubular necrosis: Secondary | ICD-10-CM | POA: Diagnosis not present

## 2021-11-17 DIAGNOSIS — D472 Monoclonal gammopathy: Secondary | ICD-10-CM | POA: Diagnosis not present

## 2021-11-18 ENCOUNTER — Other Ambulatory Visit: Payer: Self-pay

## 2021-11-18 ENCOUNTER — Encounter: Payer: Self-pay | Admitting: General Surgery

## 2021-11-18 ENCOUNTER — Encounter (HOSPITAL_COMMUNITY): Payer: Self-pay | Admitting: Hematology

## 2021-11-18 ENCOUNTER — Ambulatory Visit (INDEPENDENT_AMBULATORY_CARE_PROVIDER_SITE_OTHER): Payer: PPO | Admitting: General Surgery

## 2021-11-18 VITALS — BP 145/67 | HR 66 | Temp 98.4°F | Resp 16 | Ht 68.0 in | Wt 181.0 lb

## 2021-11-18 DIAGNOSIS — R591 Generalized enlarged lymph nodes: Secondary | ICD-10-CM

## 2021-11-18 NOTE — Patient Instructions (Signed)
Activity as tolerated as long as not pulling on the incision. ?Ok to get in pool after Monday April 24.  ? ? ?

## 2021-11-19 ENCOUNTER — Other Ambulatory Visit (HOSPITAL_COMMUNITY): Payer: Self-pay

## 2021-11-19 NOTE — Telephone Encounter (Signed)
Oral Oncology Patient Advocate Encounter ? ?I spoke with Mr Decou to set up delivery of Brukinsa.  Address verified for shipment.  Brukinsa was filled at [facility] and shipped on 11/17/21 for delivery 11/18/21.   ? ?[facility] will call 7-10 days before next refill is due to complete adherence call and set up delivery of medication.    ? ?Dennison Nancy CPHT ?Specialty Pharmacy Patient Advocate ?[place] ?Phone [phone] ?Fax [phone] ?11/19/2021 10:51 AM ? ? ?

## 2021-11-19 NOTE — Progress Notes (Signed)
[facility] ? ?Doing well and no issues with his incision.  ? ?BP (!) 145/67   Pulse 66   Temp 98.4 ?F (36.9 ?C) (Oral)   Resp 16   Ht '5\' 8"'$  (1.727 m)   Wt 181 lb (82.1 kg)   SpO2 95%   BMI 27.52 kg/m?  ? ?Left  groin healing, no erythema or drainage, induration at the area ? ?Patient s/p lymph node excision of the left inguinal region. ? ?Activity as tolerated as long as not pulling on the incision. ?Ok to get in pool after Monday April 24.  ? ?Curlene Labrum, MD ?[facility] ?[place], [place] [address] ?[phone] (office) ? ?

## 2021-11-23 ENCOUNTER — Other Ambulatory Visit: Payer: Self-pay | Admitting: *Deleted

## 2021-11-23 MED ORDER — ONDANSETRON HCL 4 MG PO TABS: 4.0000 mg | ORAL_TABLET | Freq: Three times a day (TID) | ORAL | 1 refills | Status: DC | PRN

## 2021-11-23 NOTE — Telephone Encounter (Signed)
Received fax from pharmacy in regards to Zofran prescription.  ? ?Call placed to pharmacy to inquire. Reports that no dispense quantity was noted in prescription. Prescription resent to pharmacy.  ?

## 2021-11-26 ENCOUNTER — Other Ambulatory Visit (HOSPITAL_COMMUNITY): Payer: Self-pay

## 2021-11-28 DIAGNOSIS — I10 Essential (primary) hypertension: Secondary | ICD-10-CM | POA: Diagnosis not present

## 2021-11-28 DIAGNOSIS — M1990 Unspecified osteoarthritis, unspecified site: Secondary | ICD-10-CM | POA: Diagnosis not present

## 2021-11-29 ENCOUNTER — Other Ambulatory Visit (HOSPITAL_COMMUNITY): Payer: Self-pay

## 2021-12-01 DIAGNOSIS — C8300 Small cell B-cell lymphoma, unspecified site: Secondary | ICD-10-CM | POA: Diagnosis not present

## 2021-12-01 DIAGNOSIS — D472 Monoclonal gammopathy: Secondary | ICD-10-CM | POA: Diagnosis not present

## 2021-12-01 DIAGNOSIS — D638 Anemia in other chronic diseases classified elsewhere: Secondary | ICD-10-CM | POA: Diagnosis not present

## 2021-12-01 DIAGNOSIS — N1832 Chronic kidney disease, stage 3b: Secondary | ICD-10-CM | POA: Diagnosis not present

## 2021-12-01 DIAGNOSIS — D696 Thrombocytopenia, unspecified: Secondary | ICD-10-CM | POA: Diagnosis not present

## 2021-12-01 DIAGNOSIS — R809 Proteinuria, unspecified: Secondary | ICD-10-CM | POA: Diagnosis not present

## 2021-12-02 ENCOUNTER — Inpatient Hospital Stay (HOSPITAL_COMMUNITY): Payer: PPO

## 2021-12-02 ENCOUNTER — Inpatient Hospital Stay (HOSPITAL_COMMUNITY): Payer: PPO | Attending: Hematology | Admitting: Hematology

## 2021-12-02 VITALS — BP 141/72 | HR 63 | Temp 96.3°F | Resp 20 | Ht 66.14 in | Wt 182.2 lb

## 2021-12-02 DIAGNOSIS — Z87891 Personal history of nicotine dependence: Secondary | ICD-10-CM | POA: Insufficient documentation

## 2021-12-02 DIAGNOSIS — Z809 Family history of malignant neoplasm, unspecified: Secondary | ICD-10-CM | POA: Insufficient documentation

## 2021-12-02 DIAGNOSIS — D631 Anemia in chronic kidney disease: Secondary | ICD-10-CM | POA: Insufficient documentation

## 2021-12-02 DIAGNOSIS — C8300 Small cell B-cell lymphoma, unspecified site: Secondary | ICD-10-CM | POA: Insufficient documentation

## 2021-12-02 DIAGNOSIS — N189 Chronic kidney disease, unspecified: Secondary | ICD-10-CM | POA: Insufficient documentation

## 2021-12-02 DIAGNOSIS — Z79899 Other long term (current) drug therapy: Secondary | ICD-10-CM | POA: Insufficient documentation

## 2021-12-02 DIAGNOSIS — Z808 Family history of malignant neoplasm of other organs or systems: Secondary | ICD-10-CM | POA: Diagnosis not present

## 2021-12-02 LAB — COMPREHENSIVE METABOLIC PANEL
ALT: 13 U/L (ref 0–44)
AST: 15 U/L (ref 15–41)
Albumin: 3.6 g/dL (ref 3.5–5.0)
Alkaline Phosphatase: 56 U/L (ref 38–126)
Anion gap: 5 (ref 5–15)
BUN: 43 mg/dL — ABNORMAL HIGH (ref 8–23)
CO2: 25 mmol/L (ref 22–32)
Calcium: 8.8 mg/dL — ABNORMAL LOW (ref 8.9–10.3)
Chloride: 109 mmol/L (ref 98–111)
Creatinine, Ser: 2 mg/dL — ABNORMAL HIGH (ref 0.61–1.24)
GFR, Estimated: 35 mL/min — ABNORMAL LOW (ref >60)
Glucose, Bld: 149 mg/dL — ABNORMAL HIGH (ref 70–99)
Potassium: 4.4 mmol/L (ref 3.5–5.1)
Sodium: 139 mmol/L (ref 135–145)
Total Bilirubin: 0.8 mg/dL (ref 0.3–1.2)
Total Protein: 7.7 g/dL (ref 6.5–8.1)

## 2021-12-02 LAB — CBC WITH DIFFERENTIAL/PLATELET
Basophils Absolute: 0 10*3/uL (ref 0.0–0.1)
Basophils Relative: 0 %
Eosinophils Absolute: 0.2 10*3/uL (ref 0.0–0.5)
Eosinophils Relative: 2 %
HCT: 35.7 % — ABNORMAL LOW (ref 39.0–52.0)
Hemoglobin: 11.9 g/dL — ABNORMAL LOW (ref 13.0–17.0)
Lymphocytes Relative: 72 %
Lymphs Abs: 8.7 10*3/uL — ABNORMAL HIGH (ref 0.7–4.0)
MCH: 29.2 pg (ref 26.0–34.0)
MCHC: 33.3 g/dL (ref 30.0–36.0)
MCV: 87.7 fL (ref 80.0–100.0)
Monocytes Absolute: 0.4 10*3/uL (ref 0.1–1.0)
Monocytes Relative: 3 %
Neutro Abs: 2.8 10*3/uL (ref 1.7–7.7)
Neutrophils Relative %: 23 %
Platelets: 286 10*3/uL (ref 150–400)
RBC: 4.07 MIL/uL — ABNORMAL LOW (ref 4.22–5.81)
RDW: 14 % (ref 11.5–15.5)
WBC: 12.1 10*3/uL — ABNORMAL HIGH (ref 4.0–10.5)
nRBC: 0 % (ref 0.0–0.2)

## 2021-12-02 LAB — URIC ACID: Uric Acid, Serum: 8.8 mg/dL — ABNORMAL HIGH (ref 3.7–8.6)

## 2021-12-02 LAB — LACTATE DEHYDROGENASE: LDH: 140 U/L (ref 98–192)

## 2021-12-02 LAB — MAGNESIUM: Magnesium: 2 mg/dL (ref 1.7–2.4)

## 2021-12-02 MED ORDER — ALLOPURINOL 300 MG PO TABS: 300.0000 mg | ORAL_TABLET | Freq: Every day | ORAL | 3 refills | Status: DC

## 2021-12-02 NOTE — Progress Notes (Signed)
? ?[facility] ?[address] ?[place], [place] [address] ? ? ?CLINIC:  ?Medical Oncology/Hematology ? ?PCP:  ?Monico Blitz, MD ?[address] [place] [address] ?[phone] ? ? ?REASON FOR VISIT:  ?Follow-up for stage III small lymphocytic lymphoma ? ?PRIOR THERAPY: none ? ?NGS Results: not done ? ?CURRENT THERAPY: under work-up ? ?BRIEF ONCOLOGIC HISTORY:  ?Oncology History  ? No history exists.  ? ? ?CANCER STAGING: ? Cancer Staging  ?Small lymphocytic lymphoma ([place]) ?Staging form: Hodgkin and Non-Hodgkin Lymphoma, AJCC 8th Edition ?- Clinical stage from 11/10/2021: Stage III (Small lymphocytic leukemia) - Unsigned ? ? ?INTERVAL HISTORY:  ?[name], a 76 y.o. male, returns for routine follow-up of his stage III small lymphocytic lymphoma. Kevin Hooper was last seen on 11/10/2021.  ? ?Today he reports feeling good. He denies recent fevers, night sweats, and weight loss. He started Zanubrutinib on 4/20. He denies n/v/d/c and fatigue. He started telmisartan on 4/19. He denies fatigue. He is active at home and continues to do yard work.  ? ?REVIEW OF SYSTEMS:  ?Review of Systems  ?Constitutional:  Negative for appetite change, fatigue, fever and unexpected weight change.  ?Gastrointestinal:  Negative for constipation, diarrhea, nausea and vomiting.  ?Endocrine: Negative for hot flashes.  ?Musculoskeletal:  Positive for arthralgias (5/10).  ?Neurological:  Positive for numbness (feet).  ?All other systems reviewed and are negative. ? ?PAST MEDICAL/SURGICAL HISTORY:  ?Past Medical History:  ?Diagnosis Date  ? Arthritis   ? Cataracts, bilateral 11/2016  ? Chronic kidney disease   ? sTAGE 5  ? Diabetes mellitus without complication ([place])   ? Gout   ? Hyperlipidemia   ? Hypertension   ? Joint ache   ? Prostate atrophy 2018  ? ?Past Surgical History:  ?Procedure Laterality Date  ? COLON RESECTION  2015  ? EXCISION OF SKIN TAG Left 11/01/2021  ? Procedure: EXCISION OF SKIN TAG;  Surgeon: Virl Cagey, MD;   Location: [facility];  Service: General;  Laterality: Left;  ? HERNIA REPAIR    ? UMBILICAL  ? INGUINAL LYMPH NODE BIOPSY Left 11/01/2021  ? Procedure: INGUINAL LYMPH NODE BIOPSY- LEFT;  Surgeon: Virl Cagey, MD;  Location: [facility];  Service: General;  Laterality: Left;  ? [place]  ? OTHER SURGICAL HISTORY Left   ? Arm s/p accident  ? REPLACEMENT TOTAL KNEE BILATERAL Bilateral 1990  ? TRANSURETHRAL RESECTION OF PROSTATE N/A 07/04/2017  ? Procedure: TRANSURETHRAL RESECTION OF THE PROSTATE (TURP);  Surgeon: Irine Seal, MD;  Location: [facility];  Service: Urology;  Laterality: N/A;  ? ? ?SOCIAL HISTORY:  ?Social History  ? ?Socioeconomic History  ? Marital status: Married  ?  Spouse name: Not on file  ? Number of children: 2  ? Years of education: 12+  ? Highest education level: Not on file  ?Occupational History  ? Occupation: Retired  ?Tobacco Use  ? Smoking status: Former  ?  Packs/day: 2.00  ?  Years: 25.00  ?  Pack years: 50.00  ?  Types: Cigarettes  ?  Quit date: 04/28/1992  ?  Years since quitting: 29.6  ? Smokeless tobacco: Never  ?Vaping Use  ? Vaping Use: Never used  ?Substance and Sexual Activity  ? Alcohol use: Yes  ?  Comment: Occasional  ? Drug use: No  ? Sexual activity: Not on file  ?  Comment: Married  ?Other Topics Concern  ? Not on file  ?Social History Narrative  ?  Lives at home w/ his wife  ? Right-handed  ? Caffeine: occasional tea  ? ?Social Determinants of Health  ? ?Financial Resource Strain: Not on file  ?Food Insecurity: Not on file  ?Transportation Needs: Not on file  ?Physical Activity: Not on file  ?Stress: Not on file  ?Social Connections: Not on file  ?Intimate Partner Violence: Not on file  ? ? ?FAMILY HISTORY:  ?Family History  ?Problem Relation Age of Onset  ? COPD Father   ? Throat cancer Father   ? Cancer Brother   ? ? ?CURRENT MEDICATIONS:  ?Current Outpatient Medications  ?Medication Sig Dispense Refill  ? acetaminophen (TYLENOL) 650 MG CR tablet Take 650 mg by mouth  every 8 (eight) hours as needed (Arthritis).    ? albuterol (PROVENTIL HFA;VENTOLIN HFA) 108 (90 Base) MCG/ACT inhaler Inhale 2 puffs into the lungs every 6 (six) hours as needed for wheezing or shortness of breath.     ? amLODipine (NORVASC) 5 MG tablet Take 5 mg by mouth daily.    ? b complex vitamins capsule Take 1 capsule by mouth daily. Folic acid    ? Blood Glucose Monitoring Suppl ([place] FLEX SYSTEM) w/Device KIT daily.    ? glucosamine-chondroitin 500-400 MG tablet Take 1 tablet by mouth daily.    ? Lancets (ONETOUCH DELICA PLUS [id number]) [place] See admin instructions.    ? MEGARED OMEGA-3 KRILL OIL PO Take by mouth daily.    ? metoprolol succinate (TOPROL-XL) 100 MG 24 hr tablet Take 100 mg by mouth daily.   1  ? Multiple Vitamins-Minerals (MULTIVITAMIN WITH MINERALS) tablet Take 1 tablet by mouth daily.    ? ONETOUCH VERIO test strip 1 each daily.    ? rosuvastatin (CRESTOR) 5 MG tablet Take 5 mg by mouth at bedtime.    ? telmisartan (MICARDIS) 20 MG tablet Take by mouth.    ? vitamin C (ASCORBIC ACID) 500 MG tablet Take 1,000 mg by mouth daily.    ? Zanubrutinib 80 MG CAPS Take 2 tablets (160 mg) by mouth 2 (two) times daily. 120 capsule 1  ? ?No current facility-administered medications for this visit.  ? ? ?ALLERGIES:  ?No Known Allergies ? ?PHYSICAL EXAM:  ?Performance status (ECOG): 1 - Symptomatic but completely ambulatory ? ?Vitals:  ? 12/02/21 1100  ?BP: (!) 141/72  ?Pulse: 63  ?Resp: 20  ?Temp: (!) 96.3 ?F (35.7 ?C)  ?SpO2: 99%  ? ?Wt Readings from Last 3 Encounters:  ?12/02/21 182 lb 3.2 oz (82.6 kg)  ?11/18/21 181 lb (82.1 kg)  ?11/10/21 184 lb 1.4 oz (83.5 kg)  ? ?Physical Exam ?Vitals reviewed.  ?Constitutional:   ?   Appearance: Normal appearance.  ?Cardiovascular:  ?   Rate and Rhythm: Normal rate and regular rhythm.  ?   Pulses: Normal pulses.  ?   Heart sounds: Normal heart sounds.  ?Pulmonary:  ?   Effort: Pulmonary effort is normal.  ?   Breath sounds: Normal breath sounds.   ?Musculoskeletal:  ?   Right lower leg: No edema.  ?   Left lower leg: No edema.  ?Neurological:  ?   General: No focal deficit present.  ?   Mental Status: He is alert and oriented to person, place, and time.  ?Psychiatric:     ?   Mood and Affect: Mood normal.     ?   Behavior: Behavior normal.  ?  ? ?LABORATORY DATA:  ?I have reviewed the labs as listed.  ? ?  Latest Ref Rng & Units 12/02/2021  ?  9:44 AM 10/14/2021  ?  1:48 PM 07/01/2021  ?  1:19 PM  ?CBC  ?WBC 4.0 - 10.5 K/uL 12.1   8.8   7.6    ?Hemoglobin 13.0 - 17.0 g/dL 11.9   11.7   13.6    ?Hematocrit 39.0 - 52.0 % 35.7   34.6   37.5    ?Platelets 150 - 400 K/uL 286   145   121    ? ? ?  Latest Ref Rng & Units 12/02/2021  ?  9:44 AM 11/10/2021  ? 12:03 PM 10/14/2021  ?  1:48 PM  ?CMP  ?Glucose 70 - 99 mg/dL 149   135   98    ?BUN 8 - 23 mg/dL 43   31   46    ?Creatinine 0.61 - 1.24 mg/dL 2.00   1.60   1.73    ?Sodium 135 - 145 mmol/L 139   136   138    ?Potassium 3.5 - 5.1 mmol/L 4.4   4.2   4.4    ?Chloride 98 - 111 mmol/L 109   109   108    ?CO2 22 - 32 mmol/L _0 ?Calcium 8.9 - 10.3 mg/dL 8.8   8.5   8.9    ?Total Protein 6.5 - 8.1 g/dL 7.7   7.0   7.5    ?Total Bilirubin 0.3 - 1.2 mg/dL 0.8   0.7   0.6    ?Alkaline Phos 38 - 126 U/L 56   57   58    ?AST 15 - 41 U/L _1 ?ALT 0 - 44 U/L _2 ? ? ?DIAGNOSTIC IMAGING:  ?I have independently reviewed the scans and discussed with the patient. ?No results found.  ? ?ASSESSMENT:  ?1.  Stage III small lymphocytic lymphoma: ?- Recent CT for hematuria work-up on 04/05/2018 showed several retroperitoneal lymph nodes measuring 1.2 cm and pelvic lymph nodes bilaterally, largest in the left obturator region measuring 2.2 cm. ?-Patient does not have any B symptoms including fevers, night sweats or weight loss. ?- CT scan from 2010 done at [facility] also showed lymphadenopathy and splenomegaly.  ?- PET/CT scan on 05/28/2018 showed very low metabolic activity associated  with enlarged pelvic and retroperitoneal and axillary nodes.  Spleen and bone marrow are normal.  We discussed the normal progression of low-grade lymphomas in detail. ?- Ultrasound of the abdomen from 10/30/2

## 2021-12-02 NOTE — Progress Notes (Signed)
Patient is taking Brukinsa as prescribed.  He has not missed any doses and reports no side effects at this time.   ? ?Pt notified of elevated uric acid of 8.8 and that a prescription for allopurinol had been sent to his pharmacy. Instructed on how to take the pill. He verbalizes understanding.  ?

## 2021-12-02 NOTE — Patient Instructions (Signed)
[place] at [facility] ?Discharge Instructions ? ? ?You were seen and examined today by Dr. Delton Coombes. ? ?He reviewed your lab work which is stable.  ? ?Continue Brukinsa as prescribed.  ? ?We will see you back in 4 weeks and repeat your lab work at that time.  ? ? ?Thank you for choosing [place] at [facility] to provide your oncology and hematology care.  To afford each patient quality time with our provider, please arrive at least 15 minutes before your scheduled appointment time.  ? ?If you have a lab appointment with the [facility] please come in thru the Main Entrance and check in at the main information desk. ? ?You need to re-schedule your appointment should you arrive 10 or more minutes late.  We strive to give you quality time with our providers, and arriving late affects you and other patients whose appointments are after yours.  Also, if you no show three or more times for appointments you may be dismissed from the clinic at the providers discretion.     ?Again, thank you for choosing [facility].  Our hope is that these requests will decrease the amount of time that you wait before being seen by our physicians.       ?_____________________________________________________________ ? ?Should you have questions after your visit to [facility], please contact our office at [phone] and follow the prompts.  Our office hours are 8:00 a.m. and 4:30 p.m. Monday - Friday.  Please note that voicemails left after 4:00 p.m. may not be returned until the following business day.  We are closed weekends and major holidays.  You do have access to a nurse 24-7, just call the main number to the clinic [phone] and do not press any options, hold on the line and a nurse will answer the phone.   ? ?For prescription refill requests, have your pharmacy contact our office and allow 72 hours.   ? ?Due to Covid, you will need to wear a  mask upon entering the hospital. If you do not have a mask, a mask will be given to you at the Main Entrance upon arrival. For doctor visits, patients may have 1 support person age 54 or older with them. For treatment visits, patients can not have anyone with them due to social distancing guidelines and our immunocompromised population.  ? ?   ?

## 2021-12-03 LAB — PATHOLOGIST SMEAR REVIEW

## 2021-12-06 ENCOUNTER — Other Ambulatory Visit (HOSPITAL_COMMUNITY): Payer: Self-pay

## 2021-12-10 ENCOUNTER — Other Ambulatory Visit (HOSPITAL_COMMUNITY): Payer: Self-pay

## 2021-12-13 ENCOUNTER — Other Ambulatory Visit (HOSPITAL_COMMUNITY): Payer: Self-pay

## 2021-12-21 DIAGNOSIS — E7800 Pure hypercholesterolemia, unspecified: Secondary | ICD-10-CM | POA: Diagnosis not present

## 2021-12-21 DIAGNOSIS — I10 Essential (primary) hypertension: Secondary | ICD-10-CM | POA: Diagnosis not present

## 2021-12-21 DIAGNOSIS — Z299 Encounter for prophylactic measures, unspecified: Secondary | ICD-10-CM | POA: Diagnosis not present

## 2021-12-21 DIAGNOSIS — Z6830 Body mass index (BMI) 30.0-30.9, adult: Secondary | ICD-10-CM | POA: Diagnosis not present

## 2021-12-21 DIAGNOSIS — Z0000 Encounter for general adult medical examination without abnormal findings: Secondary | ICD-10-CM | POA: Diagnosis not present

## 2021-12-30 ENCOUNTER — Other Ambulatory Visit (HOSPITAL_COMMUNITY): Payer: Self-pay | Admitting: *Deleted

## 2021-12-30 ENCOUNTER — Other Ambulatory Visit (HOSPITAL_COMMUNITY)
Admission: RE | Admit: 2021-12-30 | Discharge: 2021-12-30 | Disposition: A | Discharge status: Home / Self Care | Payer: PPO | Source: Ambulatory Visit | Attending: Nephrology | Admitting: Nephrology

## 2021-12-30 ENCOUNTER — Other Ambulatory Visit (HOSPITAL_COMMUNITY): Payer: Self-pay | Admitting: Hematology

## 2021-12-30 ENCOUNTER — Other Ambulatory Visit (HOSPITAL_COMMUNITY): Payer: Self-pay

## 2021-12-30 DIAGNOSIS — R809 Proteinuria, unspecified: Secondary | ICD-10-CM | POA: Insufficient documentation

## 2021-12-30 DIAGNOSIS — N1832 Chronic kidney disease, stage 3b: Secondary | ICD-10-CM | POA: Diagnosis not present

## 2021-12-30 DIAGNOSIS — C8300 Small cell B-cell lymphoma, unspecified site: Secondary | ICD-10-CM | POA: Insufficient documentation

## 2021-12-30 DIAGNOSIS — D638 Anemia in other chronic diseases classified elsewhere: Secondary | ICD-10-CM | POA: Diagnosis not present

## 2021-12-30 DIAGNOSIS — N170 Acute kidney failure with tubular necrosis: Secondary | ICD-10-CM | POA: Diagnosis not present

## 2021-12-30 LAB — RENAL FUNCTION PANEL
Albumin: 3.4 g/dL — ABNORMAL LOW (ref 3.5–5.0)
Anion gap: 4 — ABNORMAL LOW (ref 5–15)
BUN: 39 mg/dL — ABNORMAL HIGH (ref 8–23)
CO2: 26 mmol/L (ref 22–32)
Calcium: 8.5 mg/dL — ABNORMAL LOW (ref 8.9–10.3)
Chloride: 107 mmol/L (ref 98–111)
Creatinine, Ser: 1.89 mg/dL — ABNORMAL HIGH (ref 0.61–1.24)
GFR, Estimated: 37 mL/min — ABNORMAL LOW (ref >60)
Glucose, Bld: 121 mg/dL — ABNORMAL HIGH (ref 70–99)
Phosphorus: 3.5 mg/dL (ref 2.5–4.6)
Potassium: 4.5 mmol/L (ref 3.5–5.1)
Sodium: 137 mmol/L (ref 135–145)

## 2021-12-30 MED ORDER — BRUKINSA 80 MG PO CAPS
160.0000 mg | ORAL_CAPSULE | Freq: Two times a day (BID) | ORAL | 1 refills | Status: DC
  Filled 2022-01-11: qty 120, 30d supply, fill #0
  Filled 2022-01-27: qty 120, 30d supply, fill #1

## 2021-12-30 NOTE — Telephone Encounter (Signed)
Refilled zanubrutinib per request.  Per last ovn, patient is tolerating and is to continue therapy.

## 2022-01-03 ENCOUNTER — Other Ambulatory Visit (HOSPITAL_COMMUNITY): Payer: PPO

## 2022-01-04 ENCOUNTER — Inpatient Hospital Stay (HOSPITAL_COMMUNITY): Payer: PPO | Attending: Hematology | Admitting: Hematology

## 2022-01-04 ENCOUNTER — Inpatient Hospital Stay (HOSPITAL_COMMUNITY): Payer: PPO

## 2022-01-04 VITALS — BP 151/77 | HR 72 | Temp 98.1°F | Resp 16 | Ht 66.4 in | Wt 185.0 lb

## 2022-01-04 DIAGNOSIS — N183 Chronic kidney disease, stage 3 unspecified: Secondary | ICD-10-CM | POA: Insufficient documentation

## 2022-01-04 DIAGNOSIS — D509 Iron deficiency anemia, unspecified: Secondary | ICD-10-CM | POA: Insufficient documentation

## 2022-01-04 DIAGNOSIS — M25569 Pain in unspecified knee: Secondary | ICD-10-CM | POA: Insufficient documentation

## 2022-01-04 DIAGNOSIS — C8300 Small cell B-cell lymphoma, unspecified site: Secondary | ICD-10-CM | POA: Insufficient documentation

## 2022-01-04 DIAGNOSIS — Z87891 Personal history of nicotine dependence: Secondary | ICD-10-CM | POA: Insufficient documentation

## 2022-01-04 DIAGNOSIS — Z809 Family history of malignant neoplasm, unspecified: Secondary | ICD-10-CM | POA: Insufficient documentation

## 2022-01-04 DIAGNOSIS — Z808 Family history of malignant neoplasm of other organs or systems: Secondary | ICD-10-CM | POA: Diagnosis not present

## 2022-01-04 DIAGNOSIS — D631 Anemia in chronic kidney disease: Secondary | ICD-10-CM | POA: Diagnosis not present

## 2022-01-04 DIAGNOSIS — Z79899 Other long term (current) drug therapy: Secondary | ICD-10-CM | POA: Diagnosis not present

## 2022-01-04 LAB — COMPREHENSIVE METABOLIC PANEL
ALT: 15 U/L (ref 0–44)
AST: 13 U/L — ABNORMAL LOW (ref 15–41)
Albumin: 3.3 g/dL — ABNORMAL LOW (ref 3.5–5.0)
Alkaline Phosphatase: 55 U/L (ref 38–126)
Anion gap: 2 — ABNORMAL LOW (ref 5–15)
BUN: 34 mg/dL — ABNORMAL HIGH (ref 8–23)
CO2: 25 mmol/L (ref 22–32)
Calcium: 7.9 mg/dL — ABNORMAL LOW (ref 8.9–10.3)
Chloride: 109 mmol/L (ref 98–111)
Creatinine, Ser: 1.76 mg/dL — ABNORMAL HIGH (ref 0.61–1.24)
GFR, Estimated: 40 mL/min — ABNORMAL LOW (ref >60)
Glucose, Bld: 149 mg/dL — ABNORMAL HIGH (ref 70–99)
Potassium: 4 mmol/L (ref 3.5–5.1)
Sodium: 136 mmol/L (ref 135–145)
Total Bilirubin: 0.7 mg/dL (ref 0.3–1.2)
Total Protein: 6.9 g/dL (ref 6.5–8.1)

## 2022-01-04 LAB — CBC WITH DIFFERENTIAL/PLATELET
Abs Immature Granulocytes: 0.03 10*3/uL (ref 0.00–0.07)
Basophils Absolute: 0.1 10*3/uL (ref 0.0–0.1)
Basophils Relative: 1 %
Eosinophils Absolute: 0.4 10*3/uL (ref 0.0–0.5)
Eosinophils Relative: 4 %
HCT: 35.4 % — ABNORMAL LOW (ref 39.0–52.0)
Hemoglobin: 11.8 g/dL — ABNORMAL LOW (ref 13.0–17.0)
Immature Granulocytes: 0 %
Lymphocytes Relative: 59 %
Lymphs Abs: 6.2 10*3/uL — ABNORMAL HIGH (ref 0.7–4.0)
MCH: 29.9 pg (ref 26.0–34.0)
MCHC: 33.3 g/dL (ref 30.0–36.0)
MCV: 89.6 fL (ref 80.0–100.0)
Monocytes Absolute: 0.5 10*3/uL (ref 0.1–1.0)
Monocytes Relative: 5 %
Neutro Abs: 3.3 10*3/uL (ref 1.7–7.7)
Neutrophils Relative %: 31 %
Platelets: 227 10*3/uL (ref 150–400)
RBC: 3.95 MIL/uL — ABNORMAL LOW (ref 4.22–5.81)
RDW: 14.6 % (ref 11.5–15.5)
WBC: 10.6 10*3/uL — ABNORMAL HIGH (ref 4.0–10.5)
nRBC: 0 % (ref 0.0–0.2)

## 2022-01-04 LAB — MAGNESIUM: Magnesium: 2.2 mg/dL (ref 1.7–2.4)

## 2022-01-04 LAB — LACTATE DEHYDROGENASE: LDH: 123 U/L (ref 98–192)

## 2022-01-04 LAB — URIC ACID: Uric Acid, Serum: 3.8 mg/dL (ref 3.7–8.6)

## 2022-01-04 NOTE — Patient Instructions (Addendum)
[place] at [facility] Discharge Instructions  You were seen and examined today by Dr. Delton Coombes.  Dr. Delton Coombes discussed your most recent lab work and your labs look good. Continue taking the Zanubrutinib as prescribed.  Follow-up as scheduled in 6 weeks.    Thank you for choosing [place] at [facility] to provide your oncology and hematology care.  To afford each patient quality time with our provider, please arrive at least 15 minutes before your scheduled appointment time.   If you have a lab appointment with the [place] please come in thru the Main Entrance and check in at the main information desk.  You need to re-schedule your appointment should you arrive 10 or more minutes late.  We strive to give you quality time with our providers, and arriving late affects you and other patients whose appointments are after yours.  Also, if you no show three or more times for appointments you may be dismissed from the clinic at the providers discretion.     Again, thank you for choosing [facility].  Our hope is that these requests will decrease the amount of time that you wait before being seen by our physicians.       _____________________________________________________________  Should you have questions after your visit to [facility], please contact our office at [phone] and follow the prompts.  Our office hours are 8:00 a.m. and 4:30 p.m. Monday - Friday.  Please note that voicemails left after 4:00 p.m. may not be returned until the following business day.  We are closed weekends and major holidays.  You do have access to a nurse 24-7, just call the main number to the clinic [phone] and do not press any options, hold on the line and a nurse will answer the phone.    For prescription refill requests, have your pharmacy contact our office and allow 72 hours.    Due to Covid, you will need to wear  a mask upon entering the hospital. If you do not have a mask, a mask will be given to you at the Main Entrance upon arrival. For doctor visits, patients may have 1 support person age 2 or older with them. For treatment visits, patients can not have anyone with them due to social distancing guidelines and our immunocompromised population.

## 2022-01-04 NOTE — Progress Notes (Signed)
Kevin Hooper, [place] [address]   CLINIC:  Medical Oncology/Hematology  PCP:  Monico Blitz, MD [address] [place] [address] [phone]   REASON FOR VISIT:  Follow-up for stage III small lymphocytic lymphoma  PRIOR THERAPY: none  NGS Results: not done  CURRENT THERAPY: Zanubrutinib 160 mg twice daily.  BRIEF ONCOLOGIC HISTORY:  Oncology History   No history exists.    CANCER STAGING: Cancer Staging  Small lymphocytic lymphoma ([place]) Staging form: Hodgkin and Non-Hodgkin Lymphoma, AJCC 8th Edition - Clinical stage from 11/10/2021: Stage III (Small lymphocytic leukemia) - Unsigned   INTERVAL HISTORY:  [name], a 76 y.o. male, returns for routine follow-up of his stage III small lymphocytic lymphoma. Kevin Hooper was last seen on 12/02/2021.   Today Kevin Hooper reports feeling good. His weight is stable. Kevin Hooper denies infections, fevers, and night sweats. Kevin Hooper monitors his BP at home and reports at home it is usually stable at 150/84; at its lowest it is 120/60, and at its highest it is 178/80. Kevin Hooper continues to take Zanubrutinib BID and reports tolerating it well. Kevin Hooper reports knee pain. Kevin Hooper denies headaches. Kevin Hooper has a knot above his left elbow which appeared 2 weeks ago. Kevin Hooper reports his energy levels are fair, but his activity is limited due to the knee pain. Kevin Hooper denies mouth sores and n/v/d.   REVIEW OF SYSTEMS:  Review of Systems  Constitutional:  Negative for appetite change, fatigue, fever and unexpected weight change.  Gastrointestinal:  Negative for diarrhea, nausea and vomiting.  Endocrine: Negative for hot flashes.  Musculoskeletal:  Positive for arthralgias (9/10 knee).  Neurological:  Negative for headaches.  Psychiatric/Behavioral:  Positive for depression.   All other systems reviewed and are negative.  PAST MEDICAL/SURGICAL HISTORY:  Past Medical History:  Diagnosis Date   Arthritis    Cataracts, bilateral 11/2016   Chronic kidney  disease    sTAGE 3   Diabetes mellitus without complication ([place])    Gout    Hyperlipidemia    Hypertension    Joint ache    Prostate atrophy 2018   Past Surgical History:  Procedure Laterality Date   COLON RESECTION  2015   EXCISION OF SKIN TAG Left 11/01/2021   Procedure: EXCISION OF SKIN TAG;  Surgeon: Virl Cagey, MD;  Location: [facility];  Service: General;  Laterality: Left;   HERNIA REPAIR     UMBILICAL   INGUINAL LYMPH NODE BIOPSY Left 11/01/2021   Procedure: INGUINAL LYMPH NODE BIOPSY- LEFT;  Surgeon: Virl Cagey, MD;  Location: [facility];  Service: General;  Laterality: Left;   [place] Left    Arm s/p accident   REPLACEMENT TOTAL KNEE BILATERAL Bilateral 1990   TRANSURETHRAL RESECTION OF PROSTATE N/A 07/04/2017   Procedure: TRANSURETHRAL RESECTION OF THE PROSTATE (TURP);  Surgeon: Irine Seal, MD;  Location: [facility];  Service: Urology;  Laterality: N/A;    SOCIAL HISTORY:  Social History   Socioeconomic History   Marital status: Married    Spouse name: Not on file   Number of children: 2   Years of education: 12+   Highest education level: Not on file  Occupational History   Occupation: Retired  Tobacco Use   Smoking status: Former    Packs/day: 2.00    Years: 25.00    Pack years: 50.00    Types: Cigarettes    Quit date: 04/28/1992  Years since quitting: 29.7   Smokeless tobacco: Never  Vaping Use   Vaping Use: Never used  Substance and Sexual Activity   Alcohol use: Yes    Comment: Occasional   Drug use: No   Sexual activity: Not on file    Comment: Married  Other Topics Concern   Not on file  Social History Narrative   Lives at home w/ his wife   Right-handed   Caffeine: occasional tea   Social Determinants of Radio broadcast assistant Strain: Not on file  Food Insecurity: Not on file  Transportation Needs: Not on file  Physical Activity: Not on file  Stress: Not on file  Social Connections: Not  on file  Intimate Partner Violence: Not on file    FAMILY HISTORY:  Family History  Problem Relation Age of Onset   COPD Father    Throat cancer Father    Cancer Brother     CURRENT MEDICATIONS:  Current Outpatient Medications  Medication Sig Dispense Refill   acetaminophen (TYLENOL) 650 MG CR tablet Take 650 mg by mouth every 8 (eight) hours as needed (Arthritis).     albuterol (PROVENTIL HFA;VENTOLIN HFA) 108 (90 Base) MCG/ACT inhaler Inhale 2 puffs into the lungs every 6 (six) hours as needed for wheezing or shortness of breath.      allopurinol (ZYLOPRIM) 300 MG tablet Take 1 tablet (300 mg total) by mouth daily. 30 tablet 3   amLODipine (NORVASC) 5 MG tablet Take 5 mg by mouth daily.     b complex vitamins capsule Take 1 capsule by mouth daily. Folic acid     Blood Glucose Monitoring Suppl (ONETOUCH VERIO FLEX SYSTEM) w/Device KIT daily.     glucosamine-chondroitin 500-400 MG tablet Take 1 tablet by mouth daily.     Lancets (ONETOUCH DELICA PLUS [id number]) [place] See admin instructions.     MEGARED OMEGA-3 KRILL OIL PO Take by mouth daily.     metoprolol succinate (TOPROL-XL) 100 MG 24 hr tablet Take 100 mg by mouth daily.   1   Multiple Vitamins-Minerals (MULTIVITAMIN WITH MINERALS) tablet Take 1 tablet by mouth daily.     ONETOUCH VERIO test strip 1 each daily.     rosuvastatin (CRESTOR) 5 MG tablet Take 5 mg by mouth at bedtime.     telmisartan (MICARDIS) 20 MG tablet Take by mouth.     vitamin C (ASCORBIC ACID) 500 MG tablet Take 1,000 mg by mouth daily.     zanubrutinib (BRUKINSA) 80 MG CAPS Take 2 tablets (160 mg) by mouth 2 (two) times daily. 120 capsule 1   No current facility-administered medications for this visit.    ALLERGIES:  No Known Allergies  PHYSICAL EXAM:  Performance status (ECOG): 1 - Symptomatic but completely ambulatory  There were no vitals filed for this visit. Wt Readings from Last 3 Encounters:  12/02/21 182 lb 3.2 oz (82.6 kg)  11/18/21 181  lb (82.1 kg)  11/10/21 184 lb 1.4 oz (83.5 kg)   Physical Exam Vitals reviewed.  Constitutional:      Appearance: Normal appearance.  Cardiovascular:     Rate and Rhythm: Normal rate and regular rhythm.     Pulses: Normal pulses.     Heart sounds: Normal heart sounds.  Pulmonary:     Effort: Pulmonary effort is normal.     Breath sounds: Normal breath sounds.  Neurological:     General: No focal deficit present.     Mental Status: Kevin Hooper is alert and  oriented to person, place, and time.  Psychiatric:        Mood and Affect: Mood normal.        Behavior: Behavior normal.     LABORATORY DATA:  I have reviewed the labs as listed.     Latest Ref Rng & Units 01/04/2022    1:42 PM 12/02/2021    9:44 AM 10/14/2021    1:48 PM  CBC  WBC 4.0 - 10.5 K/uL 10.6   12.1   8.8    Hemoglobin 13.0 - 17.0 g/dL 11.8   11.9   11.7    Hematocrit 39.0 - 52.0 % 35.4   35.7   34.6    Platelets 150 - 400 K/uL 227   286   145        Latest Ref Rng & Units 12/30/2021   11:30 AM 12/02/2021    9:44 AM 11/10/2021   12:03 PM  CMP  Glucose 70 - 99 mg/dL 121   149   135    BUN 8 - 23 mg/dL 39   43   31    Creatinine 0.61 - 1.24 mg/dL 1.89   2.00   1.60    Sodium 135 - 145 mmol/L 137   139   136    Potassium 3.5 - 5.1 mmol/L 4.5   4.4   4.2    Chloride 98 - 111 mmol/L 107   109   109    CO2 22 - 32 mmol/L 26   25   23     Calcium 8.9 - 10.3 mg/dL 8.5   8.8   8.5    Total Protein 6.5 - 8.1 g/dL  7.7   7.0    Total Bilirubin 0.3 - 1.2 mg/dL  0.8   0.7    Alkaline Phos 38 - 126 U/L  56   57    AST 15 - 41 U/L  15   21    ALT 0 - 44 U/L  13   23      DIAGNOSTIC IMAGING:  I have independently reviewed the scans and discussed with the patient. No results found.   ASSESSMENT:  1.  Stage III small lymphocytic lymphoma: - Recent CT for hematuria work-up on 04/05/2018 showed several retroperitoneal lymph nodes measuring 1.2 cm and pelvic lymph nodes bilaterally, largest in the left obturator region measuring 2.2  cm. -Patient does not have any B symptoms including fevers, night sweats or weight loss. - CT scan from 2010 done at [facility] also showed lymphadenopathy and splenomegaly.  - PET/CT scan on 05/28/2018 showed very low metabolic activity associated with enlarged pelvic and retroperitoneal and axillary nodes.  Spleen and bone marrow are normal.  We discussed the normal progression of low-grade lymphomas in detail. - Ultrasound of the abdomen from 05/30/2020 from [facility] showed normal liver.  Splenomegaly measuring 13.4 x 7.8 x 15.4 cm. - Kevin Hooper has history of lymphadenopathy in the retroperitoneal region dating back to 2010.  Kevin Hooper does not have any B symptoms or cytopenias. - His renal function has been worsening lately.  Renal ultrasound on 08/09/2021 showed bilateral cortical irregularity consistent with scarring with normal renal echogenicity.  Mild left hydronephrosis. - CT renal study on 09/08/2021: Splenomegaly.  New para-aortic lymphadenopathy.  Hazy retroperitoneum surrounding the kidneys and aortic adenopathy.  Lymph node left of the aorta at the level of the left kidney measures 15 mm enlarged from prior PET scan.  Multiple periaortic lymph nodes  similar in size.  Adenopathy extends along the iliac chains with bulky external iliac nodes. - LDH level is normal.  The creatinine is 1.73. - Reviewed PET scan from 10/18/2021.  Mildly metabolic adenopathy above and below the diaphragm with mild splenomegaly, worsened since PET scan from 2019.  SUV of the lymph nodes is less than 5.  Left periaortic lymph node at the level of the left kidney measures 1.8 cm in short axis with SUV 3.2, previously measuring 6 mm on PET scan from 2019.  Left inguinal lymph node measures 2.1 cm with SUV 3.8.  Hazy retroperitoneal and mesenteric stranding with low-level associated metabolic activity in the left infrarenal retroperitoneum with SUV 3.1. - There is a question of whether this  retroperitoneal/mesenteric stranding is contributing to his hydronephrosis causing worsening renal function. - Left inguinal lymph node biopsy (11/01/2021): Small lymphocytic lymphoma - CLL FISH panel: Trisomy 12 (neutral prognosis), deletion 13 q. 14 - Indication for treatment: Threatened endorgan function - Zanubrutinib 160 mg twice daily started on 11/18/2021.   PLAN:  1.  Stage III small lymphocytic lymphoma: - Kevin Hooper is taking zanubrutinib  160 mg twice daily. - Denies any headaches or joint pains.  Blood pressure today is 150/77 in the office.  Kevin Hooper reports that his blood pressure is usually 130-150 when it is well controlled at home. - Kevin Hooper reported left knee pain since last visit.  Kevin Hooper had left TKR in 1990s.  No swelling or redness in the joint. - Reviewed labs today which showed normal LFTs with albumin 3.3.  CBC shows white count is 10.6 with hemoglobin 11.8.  Absolute lymphocyte count is 6.2 and improved from 8.7 previously.  LDH was normal. - I have recommended close monitoring of hypertension which is side effect of zanubrutinib.  If the blood pressures remain high, Kevin Hooper will call Dr. Manuella Ghazi who is primarily managing his blood pressure.  Kevin Hooper does not have any GI symptoms.  No musculoskeletal pains other than the left knee pain.  No evidence of atrial fibrillation. - Recommend orthopedic evaluation for left knee pain. - Continues on Ibrutinib 160 mg twice daily.  RTC 6 weeks for follow-up with repeat labs including tumor lysis labs.     2.  Mild normocytic anemia: - Combination anemia from CKD, relative iron deficiency and CLL. - Hemoglobin is 11.8.  Will check ferritin and iron panel at next visit.  3.  CKD: - Kevin Hooper was started on telmisartan 10 mg daily around 11/17/2021. - His creatinine has gone up to 2.0 on 12/02/2021, improved to 1.76 today.  Continue follow-up with Dr. Theador Hawthorne.  4.  TLS prophylaxis: - Uric acid is 3.8.  Continue allopurinol 300 mg daily.   Orders placed this encounter:  No  orders of the defined types were placed in this encounter.    Derek Jack, MD [place] [phone]   I, Thana Ates, am acting as a scribe for Dr. Derek Jack.  I, Derek Jack MD, have reviewed the above documentation for accuracy and completeness, and I agree with the above.

## 2022-01-10 ENCOUNTER — Ambulatory Visit (HOSPITAL_COMMUNITY): Payer: PPO | Admitting: Hematology

## 2022-01-11 ENCOUNTER — Other Ambulatory Visit (HOSPITAL_COMMUNITY): Payer: Self-pay

## 2022-01-12 ENCOUNTER — Other Ambulatory Visit (HOSPITAL_COMMUNITY): Payer: Self-pay

## 2022-01-12 DIAGNOSIS — Z299 Encounter for prophylactic measures, unspecified: Secondary | ICD-10-CM | POA: Diagnosis not present

## 2022-01-12 DIAGNOSIS — I10 Essential (primary) hypertension: Secondary | ICD-10-CM | POA: Diagnosis not present

## 2022-01-12 DIAGNOSIS — E1165 Type 2 diabetes mellitus with hyperglycemia: Secondary | ICD-10-CM | POA: Diagnosis not present

## 2022-01-12 DIAGNOSIS — E1122 Type 2 diabetes mellitus with diabetic chronic kidney disease: Secondary | ICD-10-CM | POA: Diagnosis not present

## 2022-01-12 DIAGNOSIS — Z87891 Personal history of nicotine dependence: Secondary | ICD-10-CM | POA: Diagnosis not present

## 2022-01-12 DIAGNOSIS — M1710 Unilateral primary osteoarthritis, unspecified knee: Secondary | ICD-10-CM | POA: Diagnosis not present

## 2022-01-12 DIAGNOSIS — Z6829 Body mass index (BMI) 29.0-29.9, adult: Secondary | ICD-10-CM | POA: Diagnosis not present

## 2022-01-27 ENCOUNTER — Other Ambulatory Visit (HOSPITAL_COMMUNITY): Payer: Self-pay

## 2022-01-28 DIAGNOSIS — I10 Essential (primary) hypertension: Secondary | ICD-10-CM | POA: Diagnosis not present

## 2022-01-28 DIAGNOSIS — M1990 Unspecified osteoarthritis, unspecified site: Secondary | ICD-10-CM | POA: Diagnosis not present

## 2022-02-09 DIAGNOSIS — N1832 Chronic kidney disease, stage 3b: Secondary | ICD-10-CM | POA: Diagnosis not present

## 2022-02-09 DIAGNOSIS — N170 Acute kidney failure with tubular necrosis: Secondary | ICD-10-CM | POA: Diagnosis not present

## 2022-02-09 DIAGNOSIS — R809 Proteinuria, unspecified: Secondary | ICD-10-CM | POA: Diagnosis not present

## 2022-02-09 DIAGNOSIS — D638 Anemia in other chronic diseases classified elsewhere: Secondary | ICD-10-CM | POA: Diagnosis not present

## 2022-02-09 DIAGNOSIS — C8300 Small cell B-cell lymphoma, unspecified site: Secondary | ICD-10-CM | POA: Diagnosis not present

## 2022-02-10 ENCOUNTER — Other Ambulatory Visit (HOSPITAL_COMMUNITY): Payer: Self-pay

## 2022-02-16 ENCOUNTER — Inpatient Hospital Stay (HOSPITAL_COMMUNITY): Payer: PPO

## 2022-02-16 ENCOUNTER — Inpatient Hospital Stay (HOSPITAL_COMMUNITY): Payer: PPO | Attending: Hematology | Admitting: Hematology

## 2022-02-16 ENCOUNTER — Encounter (HOSPITAL_COMMUNITY): Payer: Self-pay | Admitting: Hematology

## 2022-02-16 VITALS — BP 161/78 | HR 66 | Temp 98.7°F | Resp 18 | Wt 184.6 lb

## 2022-02-16 DIAGNOSIS — Z87891 Personal history of nicotine dependence: Secondary | ICD-10-CM | POA: Insufficient documentation

## 2022-02-16 DIAGNOSIS — D631 Anemia in chronic kidney disease: Secondary | ICD-10-CM | POA: Insufficient documentation

## 2022-02-16 DIAGNOSIS — Z79899 Other long term (current) drug therapy: Secondary | ICD-10-CM | POA: Diagnosis not present

## 2022-02-16 DIAGNOSIS — R809 Proteinuria, unspecified: Secondary | ICD-10-CM | POA: Diagnosis not present

## 2022-02-16 DIAGNOSIS — N189 Chronic kidney disease, unspecified: Secondary | ICD-10-CM | POA: Diagnosis not present

## 2022-02-16 DIAGNOSIS — D638 Anemia in other chronic diseases classified elsewhere: Secondary | ICD-10-CM | POA: Diagnosis not present

## 2022-02-16 DIAGNOSIS — C8300 Small cell B-cell lymphoma, unspecified site: Secondary | ICD-10-CM | POA: Insufficient documentation

## 2022-02-16 DIAGNOSIS — Z808 Family history of malignant neoplasm of other organs or systems: Secondary | ICD-10-CM | POA: Insufficient documentation

## 2022-02-16 DIAGNOSIS — N1832 Chronic kidney disease, stage 3b: Secondary | ICD-10-CM | POA: Diagnosis not present

## 2022-02-16 DIAGNOSIS — Z6827 Body mass index (BMI) 27.0-27.9, adult: Secondary | ICD-10-CM | POA: Diagnosis not present

## 2022-02-16 DIAGNOSIS — N170 Acute kidney failure with tubular necrosis: Secondary | ICD-10-CM | POA: Diagnosis not present

## 2022-02-16 LAB — CBC WITH DIFFERENTIAL/PLATELET
Abs Immature Granulocytes: 0.03 10*3/uL (ref 0.00–0.07)
Basophils Absolute: 0.1 10*3/uL (ref 0.0–0.1)
Basophils Relative: 1 %
Eosinophils Absolute: 0.5 10*3/uL (ref 0.0–0.5)
Eosinophils Relative: 4 %
HCT: 32.5 % — ABNORMAL LOW (ref 39.0–52.0)
Hemoglobin: 11.3 g/dL — ABNORMAL LOW (ref 13.0–17.0)
Immature Granulocytes: 0 %
Lymphocytes Relative: 56 %
Lymphs Abs: 5.7 10*3/uL — ABNORMAL HIGH (ref 0.7–4.0)
MCH: 31.2 pg (ref 26.0–34.0)
MCHC: 34.8 g/dL (ref 30.0–36.0)
MCV: 89.8 fL (ref 80.0–100.0)
Monocytes Absolute: 0.5 10*3/uL (ref 0.1–1.0)
Monocytes Relative: 4 %
Neutro Abs: 3.7 10*3/uL (ref 1.7–7.7)
Neutrophils Relative %: 35 %
Platelets: 228 10*3/uL (ref 150–400)
RBC: 3.62 MIL/uL — ABNORMAL LOW (ref 4.22–5.81)
RDW: 14.3 % (ref 11.5–15.5)
Smear Review: NORMAL
WBC: 10.4 10*3/uL (ref 4.0–10.5)
nRBC: 0 % (ref 0.0–0.2)

## 2022-02-16 LAB — LACTATE DEHYDROGENASE: LDH: 132 U/L (ref 98–192)

## 2022-02-16 LAB — FERRITIN: Ferritin: 69 ng/mL (ref 24–336)

## 2022-02-16 LAB — COMPREHENSIVE METABOLIC PANEL
ALT: 19 U/L (ref 0–44)
AST: 15 U/L (ref 15–41)
Albumin: 3.4 g/dL — ABNORMAL LOW (ref 3.5–5.0)
Alkaline Phosphatase: 53 U/L (ref 38–126)
Anion gap: 5 (ref 5–15)
BUN: 53 mg/dL — ABNORMAL HIGH (ref 8–23)
CO2: 20 mmol/L — ABNORMAL LOW (ref 22–32)
Calcium: 8.2 mg/dL — ABNORMAL LOW (ref 8.9–10.3)
Chloride: 112 mmol/L — ABNORMAL HIGH (ref 98–111)
Creatinine, Ser: 2.42 mg/dL — ABNORMAL HIGH (ref 0.61–1.24)
GFR, Estimated: 28 mL/min — ABNORMAL LOW (ref >60)
Glucose, Bld: 156 mg/dL — ABNORMAL HIGH (ref 70–99)
Potassium: 4.4 mmol/L (ref 3.5–5.1)
Sodium: 137 mmol/L (ref 135–145)
Total Bilirubin: 0.8 mg/dL (ref 0.3–1.2)
Total Protein: 6.9 g/dL (ref 6.5–8.1)

## 2022-02-16 LAB — IRON AND TIBC
Iron: 60 ug/dL (ref 45–182)
Saturation Ratios: 21 % (ref 17.9–39.5)
TIBC: 283 ug/dL (ref 250–450)
UIBC: 223 ug/dL

## 2022-02-16 LAB — MAGNESIUM: Magnesium: 2.1 mg/dL (ref 1.7–2.4)

## 2022-02-16 LAB — PHOSPHORUS: Phosphorus: 3.7 mg/dL (ref 2.5–4.6)

## 2022-02-16 LAB — URIC ACID: Uric Acid, Serum: 5.1 mg/dL (ref 3.7–8.6)

## 2022-02-16 NOTE — Progress Notes (Signed)
Hooper Hooper, Hooper Hooper   CLINIC:  Medical Oncology/Hematology  PCP:  Hooper Blitz, MD [address] [place] [address] [phone]   REASON FOR VISIT:  Follow-up for stage III small lymphocytic lymphoma  PRIOR THERAPY: none  NGS Results: not done  CURRENT THERAPY: Zanubrutinib 160 mg twice daily.  BRIEF ONCOLOGIC HISTORY:  Oncology History   No history exists.    CANCER STAGING: Cancer Staging  Small lymphocytic lymphoma ([place]) Staging form: Hodgkin and Non-Hodgkin Lymphoma, AJCC 8th Edition - Clinical stage from 11/10/2021: Stage III (Small lymphocytic leukemia) - Unsigned   INTERVAL HISTORY:  Mr. Hooper Hooper, a 76 y.o. male, returns for routine follow-up of his stage III small lymphocytic lymphoma. Kay was last seen on 01/04/2022.   Today he reports feeling good. He denies starting any new medications. He continues to take zanubrutinib, 2 tablets BID. His energy levels are good. He denies n/v/d and infections.   REVIEW OF SYSTEMS:  Review of Systems  Constitutional:  Negative for appetite change and fatigue.  Gastrointestinal:  Negative for diarrhea, nausea and vomiting.  All other systems reviewed and are negative.   PAST MEDICAL/SURGICAL HISTORY:  Past Medical History:  Diagnosis Date   Arthritis    Cataracts, bilateral 11/2016   Chronic kidney disease    sTAGE 3   Diabetes mellitus without complication ([place])    Gout    Hyperlipidemia    Hypertension    Joint ache    Prostate atrophy 2018   Past Surgical History:  Procedure Laterality Date   COLON RESECTION  2015   EXCISION OF SKIN TAG Left 11/01/2021   Procedure: EXCISION OF SKIN TAG;  Surgeon: Virl Cagey, MD;  Location: [facility];  Service: General;  Laterality: Left;   HERNIA REPAIR     UMBILICAL   INGUINAL LYMPH NODE BIOPSY Left 11/01/2021   Procedure: INGUINAL LYMPH NODE BIOPSY- LEFT;  Surgeon: Virl Cagey, MD;  Location: [facility];   Service: General;  Laterality: Left;   [place] Left    Arm s/p accident   REPLACEMENT TOTAL KNEE BILATERAL Bilateral 1990   TRANSURETHRAL RESECTION OF PROSTATE N/A 07/04/2017   Procedure: TRANSURETHRAL RESECTION OF THE PROSTATE (TURP);  Surgeon: Irine Seal, MD;  Location: [facility];  Service: Urology;  Laterality: N/A;    SOCIAL HISTORY:  Social History   Socioeconomic History   Marital status: Married    Spouse name: Not on file   Number of children: 2   Years of education: 12+   Highest education level: Not on file  Occupational History   Occupation: Retired  Tobacco Use   Smoking status: Former    Packs/day: 2.00    Years: 25.00    Total pack years: 50.00    Types: Cigarettes    Quit date: 04/28/1992    Years since quitting: 29.8   Smokeless tobacco: Never  Vaping Use   Vaping Use: Never used  Substance and Sexual Activity   Alcohol use: Yes    Comment: Occasional   Drug use: No   Sexual activity: Not on file    Comment: Married  Other Topics Concern   Not on file  Social History Narrative   Lives at home w/ his wife   Right-handed   Caffeine: occasional tea   Social Determinants of Health   Financial Resource Strain: Low Risk  (06/16/2020)   Overall Financial Resource  Strain (CARDIA)    Difficulty of Paying Living Expenses: Not hard at all  Food Insecurity: No Food Insecurity (06/16/2020)   Hunger Vital Sign    Worried About Running Out of Food in the Last Year: Never true    Ran Out of Food in the Last Year: Never true  Transportation Needs: No Transportation Needs (06/16/2020)   PRAPARE - Hydrologist (Medical): No    Lack of Transportation (Non-Medical): No  Physical Activity: Insufficiently Active (06/16/2020)   Exercise Vital Sign    Days of Exercise per Week: 7 days    Minutes of Exercise per Session: 20 min  Stress: No Stress Concern Present (06/16/2020)   [place]    Feeling of Stress : Not at all  Social Connections: Moderately Integrated (06/16/2020)   Social Connection and Isolation Panel [NHANES]    Frequency of Communication with Friends and Family: More than three times a week    Frequency of Social Gatherings with Friends and Family: Twice a week    Attends Religious Services: More than 4 times per year    Active Member of [employer] or Organizations: No    Attends Archivist Meetings: Never    Marital Status: Married  Human resources officer Violence: Not At Risk (06/16/2020)   Humiliation, Afraid, Rape, and Kick questionnaire    Fear of Current or Ex-Partner: No    Emotionally Abused: No    Physically Abused: No    Sexually Abused: No    FAMILY HISTORY:  Family History  Problem Relation Age of Onset   COPD Father    Throat cancer Father    Cancer Brother     CURRENT MEDICATIONS:  Current Outpatient Medications  Medication Sig Dispense Refill   acetaminophen (TYLENOL) 650 MG CR tablet Take 650 mg by mouth every 8 (eight) hours as needed (Arthritis).     albuterol (PROVENTIL HFA;VENTOLIN HFA) 108 (90 Base) MCG/ACT inhaler Inhale 2 puffs into the lungs every 6 (six) hours as needed for wheezing or shortness of breath.      allopurinol (ZYLOPRIM) 300 MG tablet Take 1 tablet (300 mg total) by mouth daily. 30 tablet 3   amLODipine (NORVASC) 10 MG tablet Take 10 mg by mouth daily.     b complex vitamins capsule Take 1 capsule by mouth daily. Folic acid     Blood Glucose Monitoring Suppl (ONETOUCH VERIO FLEX SYSTEM) w/Device KIT daily.     glucosamine-chondroitin 500-400 MG tablet Take 1 tablet by mouth daily.     Lancets (ONETOUCH DELICA PLUS [id number]) [place] See admin instructions.     MEGARED OMEGA-3 KRILL OIL PO Take by mouth daily.     metoprolol succinate (TOPROL-XL) 100 MG 24 hr tablet Take 100 mg by mouth daily.   1   Multiple Vitamins-Minerals (MULTIVITAMIN WITH MINERALS)  tablet Take 1 tablet by mouth daily.     ONETOUCH VERIO test strip 1 each daily.     rosuvastatin (CRESTOR) 5 MG tablet Take 5 mg by mouth at bedtime.     telmisartan (MICARDIS) 20 MG tablet Take by mouth.     vitamin C (ASCORBIC ACID) 500 MG tablet Take 1,000 mg by mouth daily.     zanubrutinib (BRUKINSA) 80 MG capsule Take 2 tablets (160 mg) by mouth 2 (two) times daily. 120 capsule 1   No current facility-administered medications for this visit.    ALLERGIES:  No Known Allergies  PHYSICAL EXAM:  Performance status (ECOG): 1 - Symptomatic but completely ambulatory  There were no vitals filed for this visit. Wt Readings from Last 3 Encounters:  01/04/22 184 lb 15.5 oz (83.9 kg)  12/02/21 182 lb 3.2 oz (82.6 kg)  11/18/21 181 lb (82.1 kg)   Physical Exam Vitals reviewed.  Constitutional:      Appearance: Normal appearance.  Cardiovascular:     Rate and Rhythm: Normal rate and regular rhythm.     Pulses: Normal pulses.     Heart sounds: Normal heart sounds.  Pulmonary:     Effort: Pulmonary effort is normal.     Breath sounds: Normal breath sounds.  Musculoskeletal:     Right lower leg: No edema.     Left lower leg: No edema.  Neurological:     General: No focal deficit present.     Mental Status: He is alert and oriented to person, place, and time.  Psychiatric:        Mood and Affect: Mood normal.        Behavior: Behavior normal.      LABORATORY DATA:  I have reviewed the labs as listed.     Latest Ref Rng & Units 01/04/2022    1:42 PM 12/02/2021    9:44 AM 10/14/2021    1:48 PM  CBC  WBC 4.0 - 10.5 K/uL 10.6  12.1  8.8   Hemoglobin 13.0 - 17.0 g/dL 11.8  11.9  11.7   Hematocrit 39.0 - 52.0 % 35.4  35.7  34.6   Platelets 150 - 400 K/uL 227  286  145       Latest Ref Rng & Units 01/04/2022    1:42 PM 12/30/2021   11:30 AM 12/02/2021    9:44 AM  CMP  Glucose 70 - 99 mg/dL 149  121  149   BUN 8 - 23 mg/dL 34  39  43   Creatinine 0.61 - 1.24 mg/dL 1.76  1.89  2.00    Sodium 135 - 145 mmol/L 136  137  139   Potassium 3.5 - 5.1 mmol/L 4.0  4.5  4.4   Chloride 98 - 111 mmol/L 109  107  109   CO2 22 - 32 mmol/L _0 Calcium 8.9 - 10.3 mg/dL 7.9  8.5  8.8   Total Protein 6.5 - 8.1 g/dL 6.9   7.7   Total Bilirubin 0.3 - 1.2 mg/dL 0.7   0.8   Alkaline Phos 38 - 126 U/L 55   56   AST 15 - 41 U/L 13   15   ALT 0 - 44 U/L 15   13     DIAGNOSTIC IMAGING:  I have independently reviewed the scans and discussed with the patient. No results found.   ASSESSMENT:  1.  Stage III small lymphocytic lymphoma: - Recent CT for hematuria work-up on 04/05/2018 showed several retroperitoneal lymph nodes measuring 1.2 cm and pelvic lymph nodes bilaterally, largest in the left obturator region measuring 2.2 cm. -Patient does not have any B symptoms including fevers, night sweats or weight loss. - CT scan from [facility] at [facility] also showed lymphadenopathy and splenomegaly.  - PET/CT scan on 05/28/2018 showed very low metabolic activity associated with enlarged pelvic and retroperitoneal and axillary nodes.  Spleen and bone marrow are normal.  We discussed the normal progression of low-grade lymphomas in detail. - Ultrasound of the abdomen from 05/30/2020 from [facility]  [place] showed normal liver.  Splenomegaly measuring 13.4 x 7.8 x 15.4 cm. - he has history of lymphadenopathy in the retroperitoneal region dating back to 2010.  He does not have any B symptoms or cytopenias. - His renal function has been worsening lately.  Renal ultrasound on 08/09/2021 showed bilateral cortical irregularity consistent with scarring with normal renal echogenicity.  Mild left hydronephrosis. - CT renal study on 09/08/2021: Splenomegaly.  New para-aortic lymphadenopathy.  Hazy retroperitoneum surrounding the kidneys and aortic adenopathy.  Lymph node left of the aorta at the level of the left kidney measures 15 mm enlarged from prior PET scan.  Multiple periaortic lymph  nodes similar in size.  Adenopathy extends along the iliac chains with bulky external iliac nodes. - LDH level is normal.  The creatinine is 1.73. - PET scan (10/18/2021): Mildly metabolic adenopathy above and below the diaphragm with mild splenomegaly, worsened since PET scan from 2019.  SUV of the lymph nodes is less than 5.  Left periaortic lymph node at the level of the left kidney measures 1.8 cm in short axis with SUV 3.2, previously measuring 6 mm on PET scan from 2019.  Left inguinal lymph node measures 2.1 cm with SUV 3.8.  Hazy retroperitoneal and mesenteric stranding with low-level associated metabolic activity in the left infrarenal retroperitoneum with SUV 3.1. - There is a question of whether this retroperitoneal/mesenteric stranding is contributing to his hydronephrosis causing worsening renal function. - Left inguinal lymph node biopsy (11/01/2021): Small lymphocytic lymphoma - CLL FISH panel: Trisomy 12 (neutral prognosis), deletion 13 q. 14 - Indication for treatment: Threatened endorgan function - Zanubrutinib 160 mg twice daily started on 11/18/2021.   PLAN:  1.  Stage III small lymphocytic lymphoma: - He is tolerating zanubrutinib 160 mg twice daily reasonably well. - Blood pressure is 160/78.  No GI side effects mentioned. - Reviewed labs today which showed worsening of creatinine, likely from zanubrutinib.  However he has retroperitoneal mesenteric stranding which could also contribute with renal function worsening. - Hence have recommended a PET CT scan to evaluate response.  RTC 4 weeks for follow-up.     2.  Mild normocytic anemia: - Combination anemia from CKD, relative iron deficiency. - Ferritin is 69 and percent saturation 21 with hemoglobin 11.3. - Recommend starting iron tablet daily with stool softener.  We will repeat labs in 4 weeks.  3.  CKD: - He was started on telmisartan 10 mg daily around 11/17/2021. - His creatinine has been consistently going up.  Today  it is 2.42. - Zanubrutinib can increase his creatinine.  I have recommended PET scan.  Continue follow-up with Dr. Theador Hawthorne and Dr Alyson Ingles.  4.  TLS prophylaxis: - Continue allopurinol 300 mg daily.  Uric acid is 5.1.   Orders placed this encounter:  No orders of the defined types were placed in this encounter.    Derek Jack, MD [place] [phone]   I, Thana Ates, am acting as a scribe for Dr. Derek Jack.  I, Derek Jack MD, have reviewed the above documentation for accuracy and completeness, and I agree with the above.

## 2022-02-16 NOTE — Patient Instructions (Addendum)
[place] at [facility] Discharge Instructions  You were seen and examined today by Dr. Delton Coombes.  Dr. Delton Coombes discussed your most recent lab work and your iron is low. Start taking over the counter Iron 325 mg everyday. If it causes constipation take a stool softener. Your kidney functions are elevated but this could be coming from the cancer medication. We will check a PET scan to let you know if the medication is still helping.  Follow-up as scheduled in 4 weeks.    Thank you for choosing [place] at [facility] to provide your oncology and hematology care.  To afford each patient quality time with our provider, please arrive at least 15 minutes before your scheduled appointment time.   If you have a lab appointment with the [facility] please come in thru the Main Entrance and check in at the main information desk.  You need to re-schedule your appointment should you arrive 10 or more minutes late.  We strive to give you quality time with our providers, and arriving late affects you and other patients whose appointments are after yours.  Also, if you no show three or more times for appointments you may be dismissed from the clinic at the providers discretion.     Again, thank you for choosing [facility].  Our hope is that these requests will decrease the amount of time that you wait before being seen by our physicians.       _____________________________________________________________  Should you have questions after your visit to [facility], please contact our office at [phone] and follow the prompts.  Our office hours are 8:00 a.m. and 4:30 p.m. Monday - Friday.  Please note that voicemails left after 4:00 p.m. may not be returned until the following business day.  We are closed weekends and major holidays.  You do have access to a nurse 24-7, just call the main number to the clinic [phone] and  do not press any options, hold on the line and a nurse will answer the phone.    For prescription refill requests, have your pharmacy contact our office and allow 72 hours.    Due to Covid, you will need to wear a mask upon entering the hospital. If you do not have a mask, a mask will be given to you at the Main Entrance upon arrival. For doctor visits, patients may have 1 support person age 72 or older with them. For treatment visits, patients can not have anyone with them due to social distancing guidelines and our immunocompromised population.

## 2022-02-18 ENCOUNTER — Ambulatory Visit (HOSPITAL_COMMUNITY)
Admission: RE | Admit: 2022-02-18 | Discharge: 2022-02-18 | Disposition: A | Discharge status: Home / Self Care | Payer: PPO | Source: Ambulatory Visit | Attending: Urology | Admitting: Urology

## 2022-02-18 ENCOUNTER — Ambulatory Visit (INDEPENDENT_AMBULATORY_CARE_PROVIDER_SITE_OTHER): Payer: PPO | Admitting: Urology

## 2022-02-18 ENCOUNTER — Encounter (HOSPITAL_COMMUNITY): Payer: Self-pay

## 2022-02-18 ENCOUNTER — Encounter: Payer: Self-pay | Admitting: Urology

## 2022-02-18 VITALS — BP 143/70 | HR 69

## 2022-02-18 DIAGNOSIS — Z8572 Personal history of non-Hodgkin lymphomas: Secondary | ICD-10-CM | POA: Diagnosis not present

## 2022-02-18 DIAGNOSIS — N401 Enlarged prostate with lower urinary tract symptoms: Secondary | ICD-10-CM

## 2022-02-18 DIAGNOSIS — Z8546 Personal history of malignant neoplasm of prostate: Secondary | ICD-10-CM | POA: Diagnosis not present

## 2022-02-18 DIAGNOSIS — N1330 Unspecified hydronephrosis: Secondary | ICD-10-CM

## 2022-02-18 DIAGNOSIS — N138 Other obstructive and reflux uropathy: Secondary | ICD-10-CM | POA: Diagnosis not present

## 2022-02-18 DIAGNOSIS — N400 Enlarged prostate without lower urinary tract symptoms: Secondary | ICD-10-CM | POA: Diagnosis not present

## 2022-02-18 DIAGNOSIS — N132 Hydronephrosis with renal and ureteral calculous obstruction: Secondary | ICD-10-CM | POA: Diagnosis not present

## 2022-02-18 DIAGNOSIS — N200 Calculus of kidney: Secondary | ICD-10-CM

## 2022-02-18 LAB — URINALYSIS, ROUTINE W REFLEX MICROSCOPIC
Bilirubin, UA: NEGATIVE
Glucose, UA: NEGATIVE
Ketones, UA: NEGATIVE
Leukocytes,UA: NEGATIVE
Nitrite, UA: NEGATIVE
Specific Gravity, UA: <1.005 — ABNORMAL LOW (ref 1.005–1.030)
Urobilinogen, Ur: 0.2 mg/dL (ref 0.2–1.0)
pH, UA: 5 (ref 5.0–7.5)

## 2022-02-18 LAB — MICROSCOPIC EXAMINATION
Epithelial Cells (non renal): NONE SEEN /hpf (ref 0–10)
Renal Epithel, UA: NONE SEEN /hpf
WBC, UA: NONE SEEN /hpf (ref 0–5)

## 2022-02-18 NOTE — Progress Notes (Addendum)
02/18/2022 10:10 AM   Kevin Hooper 04-13-1946 [id number]  Referring provider: Monico Blitz, MD [address] [place],  [place] [address]  Followup hydronephrosis   HPI: Kevin Hooper is a 76yo here for followup for left hydronephrosis. He had been doing well until this month when his creatinine increased to 2.4. He has lymphoma and currently under the care of Dr. Delton Coombes. CT from 09/2021 shows very mild left hydronephrosis. He has chronic left flank pain. No significant LUTS.    PMH: Past Medical History:  Diagnosis Date   Arthritis    Cataracts, bilateral 11/2016   Chronic kidney disease    sTAGE 3   Diabetes mellitus without complication ([place])    Gout    Hyperlipidemia    Hypertension    Joint ache    Prostate atrophy 2018    Surgical History: Past Surgical History:  Procedure Laterality Date   COLON RESECTION  2015   EXCISION OF SKIN TAG Left 11/01/2021   Procedure: EXCISION OF SKIN TAG;  Surgeon: Virl Cagey, MD;  Location: [facility];  Service: General;  Laterality: Left;   HERNIA REPAIR     UMBILICAL   INGUINAL LYMPH NODE BIOPSY Left 11/01/2021   Procedure: INGUINAL LYMPH NODE BIOPSY- LEFT;  Surgeon: Virl Cagey, MD;  Location: [facility];  Service: General;  Laterality: Left;   [place] HISTORY Left    Arm s/p accident   REPLACEMENT TOTAL KNEE BILATERAL Bilateral 1990   TRANSURETHRAL RESECTION OF PROSTATE N/A 07/04/2017   Procedure: TRANSURETHRAL RESECTION OF THE PROSTATE (TURP);  Surgeon: Irine Seal, MD;  Location: [facility];  Service: Urology;  Laterality: N/A;    Home Medications:  Allergies as of 02/18/2022   No Known Allergies      Medication List        Accurate as of February 18, 2022 10:10 AM. If you have any questions, ask your nurse or doctor.          acetaminophen 650 MG CR tablet Commonly known as: TYLENOL Take 650 mg by mouth every 8 (eight) hours as needed (Arthritis).   albuterol 108 (90 Base) MCG/ACT  inhaler Commonly known as: VENTOLIN HFA Inhale 2 puffs into the lungs every 6 (six) hours as needed for wheezing or shortness of breath.   allopurinol 300 MG tablet Commonly known as: ZYLOPRIM Take 1 tablet (300 mg total) by mouth daily.   amLODipine 10 MG tablet Commonly known as: NORVASC Take 10 mg by mouth daily.   b complex vitamins capsule Take 1 capsule by mouth daily. Folic acid   Brukinsa 80 MG capsule Generic drug: zanubrutinib Take 2 tablets (160 mg) by mouth 2 (two) times daily.   glucosamine-chondroitin 500-400 MG tablet Take 1 tablet by mouth daily.   MEGARED OMEGA-3 KRILL OIL PO Take by mouth daily.   metoprolol succinate 100 MG 24 hr tablet Commonly known as: TOPROL-XL Take 100 mg by mouth daily.   multivitamin with minerals tablet Take 1 tablet by mouth daily.   OneTouch Delica Plus [id number] Misc See admin instructions.   OneTouch Verio Flex System w/Device Kit daily.   OneTouch Verio test strip Generic drug: glucose blood 1 each daily.   rosuvastatin 5 MG tablet Commonly known as: CRESTOR Take 5 mg by mouth at bedtime.   telmisartan 20 MG tablet Commonly known as: MICARDIS Take by mouth.   vitamin C 500 MG tablet Commonly known as: ASCORBIC ACID Take 1,000 mg by  mouth daily.        Allergies: No Known Allergies  Family History: Family History  Problem Relation Age of Onset   COPD Father    Throat cancer Father    Cancer Brother     Social History:  reports that he quit smoking about 29 years ago. His smoking use included cigarettes. He has a 50.00 pack-year smoking history. He has never used smokeless tobacco. He reports current alcohol use. He reports that he does not use drugs.  ROS: All other review of systems were reviewed and are negative except what is noted above in HPI  Physical Exam: BP (!) 143/70   Pulse 69   Constitutional:  Alert and oriented, No acute distress. HEENT: [place] AT, moist mucus membranes.  Trachea  midline, no masses. Cardiovascular: No clubbing, cyanosis, or edema. Respiratory: Normal respiratory effort, no increased work of breathing. GI: Abdomen is soft, nontender, nondistended, no abdominal masses GU: No CVA tenderness.  Lymph: No cervical or inguinal lymphadenopathy. Skin: No rashes, bruises or suspicious lesions. Neurologic: Grossly intact, no focal deficits, moving all 4 extremities. Psychiatric: Normal mood and affect.  Laboratory Data: Lab Results  Component Value Date   WBC 10.4 02/16/2022   HGB 11.3 (L) 02/16/2022   HCT 32.5 (L) 02/16/2022   MCV 89.8 02/16/2022   PLT 228 02/16/2022    Lab Results  Component Value Date   CREATININE 2.42 (H) 02/16/2022    No results found for: "PSA"  No results found for: "TESTOSTERONE"  No results found for: "HGBA1C"  Urinalysis    Component Value Date/Time   COLORURINE YELLOW 03/09/2018 [place] 09/15/2021 1455   LABSPEC 1.017 03/09/2018 1244   PHURINE 5.0 03/09/2018 1244   GLUCOSEU Negative 09/15/2021 1455   HGBUR LARGE (A) 03/09/2018 1244   BILIRUBINUR Negative 09/15/2021 1455   KETONESUR NEGATIVE 03/09/2018 1244   PROTEINUR 3+ (A) 09/15/2021 1455   PROTEINUR 30 (A) 03/09/2018 1244   UROBILINOGEN 0.2 10/01/2008 0926   NITRITE Negative 09/15/2021 1455   NITRITE NEGATIVE 03/09/2018 1244   LEUKOCYTESUR Negative 09/15/2021 1455    Lab Results  Component Value Date   LABMICR See below: 09/15/2021   WBCUA None seen 09/15/2021   LABEPIT None seen 09/15/2021   MUCUS Present 09/15/2021   BACTERIA None seen 09/15/2021    Pertinent Imaging:  No results found for this or any previous visit.  No results found for this or any previous visit.  No results found for this or any previous visit.  No results found for this or any previous visit.  Results for orders placed during the hospital encounter of 08/09/21  US RENAL  Narrative CLINICAL DATA:  Chronic renal disease.  EXAM: RENAL /  URINARY TRACT ULTRASOUND COMPLETE  COMPARISON:  PET-CT 05/28/2018.  CT abdomen 04/05/2018.  FINDINGS: Right Kidney:  Renal measurements: 13.6 x 5.3 x 5.5 cm = volume: 206 mL. Cortical irregularity consistent scarring. Echogenicity within normal limits. Simple cysts with the largest measuring 1.4 cm. Probable 3 mm nonobstructing calyceal stone. No hydronephrosis visualized.  Left Kidney:  Renal measurements: 12.5 x 5.5 x 4.3 cm = volume: 159 mL. Cortical irregularity consistent with scarring. Echogenicity within normal limits. 7 mm simple cyst cannot be excluded. Mild left hydronephrosis noted.  Bladder:  Appears normal for degree of bladder distention. Prevoid volume 241 cc. Postvoid volume 34 cc.  Other:  None.  IMPRESSION: 1. Bilateral cortical irregularity consistent with scarring. Renal echogenicity normal. Bilateral simple renal cysts noted.  2.  Probable 3 mm nonobstructing right renal calyceal stone.  3.  Mild left hydronephrosis noted.  Bladder is nondistended.   Electronically Signed By: Marcello Moores  Register M.D. On: 08/10/2021 04:32  No results found for this or any previous visit.  No results found for this or any previous visit.  Results for orders placed in visit on 09/08/21  CT RENAL STONE STUDY  Narrative CLINICAL DATA:  Flank pain.  EXAM: CT ABDOMEN AND PELVIS WITHOUT CONTRAST  TECHNIQUE: Multidetector CT imaging of the abdomen and pelvis was performed following the standard protocol without IV contrast.  RADIATION DOSE REDUCTION: This exam was performed according to the departmental dose-optimization program which includes automated exposure control, adjustment of the mA and/or kV according to patient size and/or use of iterative reconstruction technique.  COMPARISON:  PET-CT scan 05/20/2018 on CT scan 04/05/2018  FINDINGS: Lower chest: Segmental pattern of new nodularity in the RIGHT lower lobe. Approximately 10 nodules with peripheral  ground-glass density. Example nodule measures 11 mm (image 5/series 4)  Hepatobiliary: No focal hepatic lesion. No biliary duct dilatation. Common bile duct is normal.  Pancreas: Pancreas is normal. No ductal dilatation. No pancreatic inflammation.  Spleen: Enlarged spleen measures 13 cm 15.5 cm 7.9 cm (volume = 830 cm^3).  Adrenals/urinary tract: Adrenal glands normal. Small nonobstructing calculus in the LEFT kidney. No ureterolithiasis or obstructive uropathy bladder normal  Stomach/Bowel: Stomach normal. Small bowel normal. Post RIGHT hemicolectomy. LEFT colon normal.  Vascular/Lymphatic: Intimal calcification of the aorta which is nonaneurysmal.  There is new periaortic lymphadenopathy. There is hazy retroperitoneum surrounding the kidneys and aortic adenopathy. Example lymph node LEFT of the aorta at the level of the LEFT kidney measures 15 mm short axis (image 35/2) this is enlarged from comparison PET-CT scan.  There are multiple periaortic lymph nodes.  Similar size.  Adenopathy extends the pelvis. LEFT external iliac node measures 12 mm (image 63/2). LEFT inguinal node is prominent at 15 mm.  Large LEFT external iliac node anterior the operator space measures 20 mm short axis (72/2). This node may be assessable for biopsy.  Reproductive: Prostate gland is enlarged. TURP defect centrally in the gland. Gland measures 5.3 4.7 by 4.7 cm (volume = 61 cm^3).  Other: Small amount free fluid the pelvis.  Musculoskeletal: No aggressive osseous lesion.  IMPRESSION: 1. New bulky periaortic retroperitoneal adenopathy. Adenopathy extends along the iliac chains with bulky external iliac nodes. Findings concerning for lymphoma versus metastatic prostate carcinoma. With splenomegaly, favor lymphoproliferative disease. 2. Periaortic retroperitoneal stranding around the aorta and kidneys favored related adenopathy. 3. Splenomegaly. 4. New segmental nodular pattern in the  RIGHT lower lobe. Differential includes pulmonary infection versus neoplasm. Favor infection.   Electronically Signed By: Suzy Bouchard M.D. On: 09/08/2021 15:53   Assessment & Plan:    1. Hydronephrosis, unspecified hydronephrosis type CT shows resolution of hydronephrosis   No follow-ups on file.  Nicolette Bang, MD  [facility] [place]

## 2022-02-18 NOTE — Patient Instructions (Signed)
Hydronephrosis  Hydronephrosis is the swelling of one or both kidneys due to a blockage that stops urine from flowing out of the body. Kidneys filter waste from the blood and produce urine. This condition can lead to kidney failure and may become life-threatening if not treated promptly. What are the causes? In infants and children, common causes include problems that occur when a baby is developing in the womb. These can include problems in the kidneys or in the tubes that drain urine into the bladder (ureters). In adults, common causes include: Kidney stones. Pregnancy. A tumor or cyst in the abdomen or pelvis. An enlarged prostate gland. Other causes include: Bladder infection. Scar tissue from a previous surgery or injury. A blood clot. Cancer of the prostate, bladder, uterus, ovary, or colon. What are the signs or symptoms? Symptoms of this condition include: Pain or discomfort in your side (flank) or abdomen. Swelling in your abdomen. Nausea and vomiting. Fever. Pain when passing urine. Feelings of urgency when you need to urinate. Urinating more often than normal. In some cases, you may not have any symptoms. How is this diagnosed? This condition may be diagnosed based on: Your symptoms and medical history. A physical exam. Blood and urine tests. Imaging tests, such as an ultrasound, CT scan, or MRI. A procedure to look at your urinary tract and bladder by inserting a scope into the urethra (cystoscopy). How is this treated? Treatment for this condition depends on where the blockage is, how long it has been there, and what caused it. The goal of treatment is to remove the blockage. Treatment may include: Antibiotic medicines to treat or prevent infection. A procedure to place a small, thin tube (stent) into a blocked ureter. The stent will keep the ureter open so that urine can drain through it. A nonsurgical procedure that crushes kidney stones with shock waves  (extracorporeal shock wave lithotripsy). If kidney failure occurs, treatment may include dialysis or a kidney transplant. Follow these instructions at home:  Take over-the-counter and prescription medicines only as told by your health care provider. If you were prescribed an antibiotic medicine, take it exactly as told by your health care provider. Do not stop taking the antibiotic even if you start to feel better. Rest and return to your normal activities as told by your health care provider. Ask your health care provider what activities are safe for you. Drink enough fluid to keep your urine pale yellow. Keep all follow-up visits. This is important. Contact a health care provider if: You continue to have symptoms after treatment. You develop new symptoms. Your urine becomes cloudy or bloody. You have a fever. Get help right away if: You have severe flank or abdominal pain. You cannot drink fluids without vomiting. Summary Hydronephrosis is the swelling of one or both kidneys due to a blockage that stops urine from flowing out of the body. Hydronephrosis can lead to kidney failure and may become life-threatening if not treated promptly. The goal of treatment is to remove the blockage. It may include a procedure to insert a stent into a blocked ureter, a procedure to break up kidney stones, or taking antibiotic medicines. Follow your health care provider's instructions for taking care of yourself at home, including instructions about drinking fluids, taking medicines, and limiting activities. This information is not intended to replace advice given to you by your health care provider. Make sure you discuss any questions you have with your health care provider. Document Revised: 11/05/2019 Document Reviewed: 11/05/2019 Elsevier   Patient Education  2023 [employer]  

## 2022-02-24 ENCOUNTER — Ambulatory Visit (HOSPITAL_COMMUNITY)
Admission: RE | Admit: 2022-02-24 | Discharge: 2022-02-24 | Disposition: A | Discharge status: Home / Self Care | Payer: PPO | Source: Ambulatory Visit | Attending: Hematology | Admitting: Hematology

## 2022-02-24 DIAGNOSIS — C8300 Small cell B-cell lymphoma, unspecified site: Secondary | ICD-10-CM | POA: Insufficient documentation

## 2022-02-24 MED ORDER — FLUDEOXYGLUCOSE F - 18 (FDG) INJECTION
9.5500 | Freq: Once | INTRAVENOUS | Status: AC | PRN
  Administered 2022-02-24: 9.55 via INTRAVENOUS

## 2022-03-07 ENCOUNTER — Other Ambulatory Visit (HOSPITAL_COMMUNITY): Payer: Self-pay | Admitting: Hematology

## 2022-03-07 ENCOUNTER — Other Ambulatory Visit (HOSPITAL_COMMUNITY): Payer: Self-pay

## 2022-03-07 DIAGNOSIS — C8300 Small cell B-cell lymphoma, unspecified site: Secondary | ICD-10-CM

## 2022-03-07 MED ORDER — BRUKINSA 80 MG PO CAPS
160.0000 mg | ORAL_CAPSULE | Freq: Two times a day (BID) | ORAL | 1 refills | Status: DC
  Filled 2022-03-07: qty 120, 30d supply, fill #0
  Filled 2022-04-05: qty 120, 30d supply, fill #1

## 2022-03-10 ENCOUNTER — Other Ambulatory Visit (HOSPITAL_COMMUNITY): Payer: Self-pay

## 2022-03-14 ENCOUNTER — Other Ambulatory Visit (HOSPITAL_COMMUNITY): Payer: Self-pay | Admitting: Hematology

## 2022-03-17 ENCOUNTER — Other Ambulatory Visit (HOSPITAL_COMMUNITY): Payer: Self-pay | Admitting: *Deleted

## 2022-03-17 ENCOUNTER — Inpatient Hospital Stay: Payer: PPO | Admitting: Hematology

## 2022-03-17 ENCOUNTER — Inpatient Hospital Stay: Payer: PPO | Attending: Hematology

## 2022-03-17 VITALS — BP 160/75 | HR 77 | Temp 98.0°F | Resp 16 | Wt 181.9 lb

## 2022-03-17 DIAGNOSIS — C8300 Small cell B-cell lymphoma, unspecified site: Secondary | ICD-10-CM | POA: Insufficient documentation

## 2022-03-17 DIAGNOSIS — Z808 Family history of malignant neoplasm of other organs or systems: Secondary | ICD-10-CM | POA: Diagnosis not present

## 2022-03-17 DIAGNOSIS — Z79899 Other long term (current) drug therapy: Secondary | ICD-10-CM | POA: Insufficient documentation

## 2022-03-17 DIAGNOSIS — N189 Chronic kidney disease, unspecified: Secondary | ICD-10-CM | POA: Insufficient documentation

## 2022-03-17 DIAGNOSIS — D631 Anemia in chronic kidney disease: Secondary | ICD-10-CM | POA: Insufficient documentation

## 2022-03-17 DIAGNOSIS — Z87891 Personal history of nicotine dependence: Secondary | ICD-10-CM | POA: Diagnosis not present

## 2022-03-17 LAB — CBC WITH DIFFERENTIAL/PLATELET
Abs Immature Granulocytes: 0.03 10*3/uL (ref 0.00–0.07)
Basophils Absolute: 0.1 10*3/uL (ref 0.0–0.1)
Basophils Relative: 1 %
Eosinophils Absolute: 0.3 10*3/uL (ref 0.0–0.5)
Eosinophils Relative: 4 %
HCT: 36.7 % — ABNORMAL LOW (ref 39.0–52.0)
Hemoglobin: 12.6 g/dL — ABNORMAL LOW (ref 13.0–17.0)
Immature Granulocytes: 0 %
Lymphocytes Relative: 52 %
Lymphs Abs: 4.2 10*3/uL — ABNORMAL HIGH (ref 0.7–4.0)
MCH: 31.2 pg (ref 26.0–34.0)
MCHC: 34.3 g/dL (ref 30.0–36.0)
MCV: 90.8 fL (ref 80.0–100.0)
Monocytes Absolute: 0.4 10*3/uL (ref 0.1–1.0)
Monocytes Relative: 5 %
Neutro Abs: 3.1 10*3/uL (ref 1.7–7.7)
Neutrophils Relative %: 38 %
Platelets: 224 10*3/uL (ref 150–400)
RBC: 4.04 MIL/uL — ABNORMAL LOW (ref 4.22–5.81)
RDW: 13.9 % (ref 11.5–15.5)
WBC: 8.1 10*3/uL (ref 4.0–10.5)
nRBC: 0 % (ref 0.0–0.2)

## 2022-03-17 LAB — COMPREHENSIVE METABOLIC PANEL
ALT: 21 U/L (ref 0–44)
AST: 17 U/L (ref 15–41)
Albumin: 3.5 g/dL (ref 3.5–5.0)
Alkaline Phosphatase: 62 U/L (ref 38–126)
Anion gap: 4 — ABNORMAL LOW (ref 5–15)
BUN: 36 mg/dL — ABNORMAL HIGH (ref 8–23)
CO2: 28 mmol/L (ref 22–32)
Calcium: 9 mg/dL (ref 8.9–10.3)
Chloride: 111 mmol/L (ref 98–111)
Creatinine, Ser: 1.95 mg/dL — ABNORMAL HIGH (ref 0.61–1.24)
GFR, Estimated: 35 mL/min — ABNORMAL LOW (ref >60)
Glucose, Bld: 133 mg/dL — ABNORMAL HIGH (ref 70–99)
Potassium: 4.4 mmol/L (ref 3.5–5.1)
Sodium: 143 mmol/L (ref 135–145)
Total Bilirubin: 0.6 mg/dL (ref 0.3–1.2)
Total Protein: 7.6 g/dL (ref 6.5–8.1)

## 2022-03-17 LAB — IRON AND TIBC
Iron: 72 ug/dL (ref 45–182)
Saturation Ratios: 24 % (ref 17.9–39.5)
TIBC: 303 ug/dL (ref 250–450)
UIBC: 231 ug/dL

## 2022-03-17 LAB — FERRITIN: Ferritin: 73 ng/mL (ref 24–336)

## 2022-03-17 NOTE — Progress Notes (Signed)
[place], [place] [address]   CLINIC:  Medical Oncology/Hematology  PCP:  Monico Blitz, MD [address] [place] [address] [phone]   REASON FOR VISIT:  Follow-up for stage III small lymphocytic lymphoma  PRIOR THERAPY: none  NGS Results: not done  CURRENT THERAPY: Zanubrutinib 160 mg twice daily.  BRIEF ONCOLOGIC HISTORY:  Oncology History   No history exists.    CANCER STAGING:  Cancer Staging  Small lymphocytic lymphoma ([place]) Staging form: Hodgkin and Non-Hodgkin Lymphoma, AJCC 8th Edition - Clinical stage from 11/10/2021: Stage III (Small lymphocytic leukemia) - Unsigned   INTERVAL HISTORY:  Mr. TANUSH DREES, a 76 y.o. male, returns for follow-up and toxicity assessment for small lymphocytic lymphoma.  He is taking zanubrutinib 160 mg twice daily without fail.  He denies any GI symptoms including nausea, vomiting or diarrhea.  He reported nosebleed which happened couple of times in the last 1 month on blowing the nose.  This lasted few minutes.  He also reported increase in blood pressure at home and blood sugars.  REVIEW OF SYSTEMS:  Review of Systems  Constitutional:  Negative for appetite change and fatigue.  Gastrointestinal:  Negative for diarrhea, nausea and vomiting.  All other systems reviewed and are negative.   PAST MEDICAL/SURGICAL HISTORY:  Past Medical History:  Diagnosis Date   Arthritis    Cataracts, bilateral 11/2016   Chronic kidney disease    sTAGE 3   Diabetes mellitus without complication ([place])    Gout    Hyperlipidemia    Hypertension    Joint ache    Prostate atrophy 2018   Past Surgical History:  Procedure Laterality Date   COLON RESECTION  2015   EXCISION OF SKIN TAG Left 11/01/2021   Procedure: EXCISION OF SKIN TAG;  Surgeon: Virl Cagey, MD;  Location: [facility];  Service: General;  Laterality: Left;   HERNIA REPAIR     UMBILICAL   INGUINAL LYMPH NODE BIOPSY Left 11/01/2021    Procedure: INGUINAL LYMPH NODE BIOPSY- LEFT;  Surgeon: Virl Cagey, MD;  Location: [facility];  Service: General;  Laterality: Left;   [place] Left    Arm s/p accident   REPLACEMENT TOTAL KNEE BILATERAL Bilateral 1990   TRANSURETHRAL RESECTION OF PROSTATE N/A 07/04/2017   Procedure: TRANSURETHRAL RESECTION OF THE PROSTATE (TURP);  Surgeon: Irine Seal, MD;  Location: [facility];  Service: Urology;  Laterality: N/A;    SOCIAL HISTORY:  Social History   Socioeconomic History   Marital status: Married    Spouse name: Not on file   Number of children: 2   Years of education: 12+   Highest education level: Not on file  Occupational History   Occupation: Retired  Tobacco Use   Smoking status: Former    Packs/day: 2.00    Years: 25.00    Total pack years: 50.00    Types: Cigarettes    Quit date: 04/28/1992    Years since quitting: 29.9   Smokeless tobacco: Never  Vaping Use   Vaping Use: Never used  Substance and Sexual Activity   Alcohol use: Yes    Comment: Occasional   Drug use: No   Sexual activity: Not on file    Comment: Married  Other Topics Concern   Not on file  Social History Narrative   Lives at home w/ his wife   Right-handed   Caffeine: occasional tea  Social Determinants of Health   Financial Resource Strain: Low Risk  (06/16/2020)   Overall Financial Resource Strain (CARDIA)    Difficulty of Paying Living Expenses: Not hard at all  Food Insecurity: No Food Insecurity (06/16/2020)   Hunger Vital Sign    Worried About Running Out of Food in the Last Year: Never true    Ran Out of Food in the Last Year: Never true  Transportation Needs: No Transportation Needs (06/16/2020)   PRAPARE - Hydrologist (Medical): No    Lack of Transportation (Non-Medical): No  Physical Activity: Insufficiently Active (06/16/2020)   Exercise Vital Sign    Days of Exercise per Week: 7 days    Minutes of Exercise  per Session: 20 min  Stress: No Stress Concern Present (06/16/2020)   [place]    Feeling of Stress : Not at all  Social Connections: Moderately Integrated (06/16/2020)   Social Connection and Isolation Panel [NHANES]    Frequency of Communication with Friends and Family: More than three times a week    Frequency of Social Gatherings with Friends and Family: Twice a week    Attends Religious Services: More than 4 times per year    Active Member of [employer] or Organizations: No    Attends Archivist Meetings: Never    Marital Status: Married  Human resources officer Violence: Not At Risk (06/16/2020)   Humiliation, Afraid, Rape, and Kick questionnaire    Fear of Current or Ex-Partner: No    Emotionally Abused: No    Physically Abused: No    Sexually Abused: No    FAMILY HISTORY:  Family History  Problem Relation Age of Onset   COPD Father    Throat cancer Father    Cancer Brother     CURRENT MEDICATIONS:  Current Outpatient Medications  Medication Sig Dispense Refill   acetaminophen (TYLENOL) 650 MG CR tablet Take 650 mg by mouth every 8 (eight) hours as needed (Arthritis).     albuterol (PROVENTIL HFA;VENTOLIN HFA) 108 (90 Base) MCG/ACT inhaler Inhale 2 puffs into the lungs every 6 (six) hours as needed for wheezing or shortness of breath.      allopurinol (ZYLOPRIM) 300 MG tablet TAKE ONE TABLET BY MOUTH ONCE DAILY 30 tablet 3   amLODipine (NORVASC) 10 MG tablet Take 10 mg by mouth daily.     b complex vitamins capsule Take 1 capsule by mouth daily. Folic acid     Blood Glucose Monitoring Suppl (ONETOUCH VERIO FLEX SYSTEM) w/Device KIT daily.     glucosamine-chondroitin 500-400 MG tablet Take 1 tablet by mouth daily.     Lancets (ONETOUCH DELICA PLUS [id number]) [place] See admin instructions.     MEGARED OMEGA-3 KRILL OIL PO Take by mouth daily.     metoprolol succinate (TOPROL-XL) 100 MG 24 hr tablet Take 100  mg by mouth daily.   1   Multiple Vitamins-Minerals (MULTIVITAMIN WITH MINERALS) tablet Take 1 tablet by mouth daily.     ONETOUCH VERIO test strip 1 each daily.     rosuvastatin (CRESTOR) 5 MG tablet Take 5 mg by mouth at bedtime.     telmisartan (MICARDIS) 20 MG tablet Take by mouth.     vitamin C (ASCORBIC ACID) 500 MG tablet Take 1,000 mg by mouth daily.     zanubrutinib (BRUKINSA) 80 MG capsule Take 2 tablets (160 mg) by mouth 2 (two) times daily. 120 capsule 1  No current facility-administered medications for this visit.    ALLERGIES:  No Known Allergies  PHYSICAL EXAM:  Performance status (ECOG): 1 - Symptomatic but completely ambulatory  Vitals:   03/17/22 0850  BP: (!) 160/75  Pulse: 77  Resp: 16  Temp: 98 F (36.7 C)  SpO2: 99%   Wt Readings from Last 3 Encounters:  03/17/22 181 lb 14.1 oz (82.5 kg)  02/16/22 184 lb 9.6 oz (83.7 kg)  01/04/22 184 lb 15.5 oz (83.9 kg)   Physical Exam Vitals reviewed.  Constitutional:      Appearance: Normal appearance.  Cardiovascular:     Rate and Rhythm: Normal rate and regular rhythm.     Pulses: Normal pulses.     Heart sounds: Normal heart sounds.  Pulmonary:     Effort: Pulmonary effort is normal.     Breath sounds: Normal breath sounds.  Musculoskeletal:     Right lower leg: No edema.     Left lower leg: No edema.  Neurological:     General: No focal deficit present.     Mental Status: He is alert and oriented to person, place, and time.  Psychiatric:        Mood and Affect: Mood normal.        Behavior: Behavior normal.      LABORATORY DATA:  I have reviewed the labs as listed.     Latest Ref Rng & Units 03/17/2022    8:33 AM 02/16/2022    3:01 PM 01/04/2022    1:42 PM  CBC  WBC 4.0 - 10.5 K/uL 8.1  10.4  10.6   Hemoglobin 13.0 - 17.0 g/dL 12.6  11.3  11.8   Hematocrit 39.0 - 52.0 % 36.7  32.5  35.4   Platelets 150 - 400 K/uL 224  228  227       Latest Ref Rng & Units 03/17/2022    8:33 AM 02/16/2022     3:01 PM 01/04/2022    1:42 PM  CMP  Glucose 70 - 99 mg/dL 133  156  149   BUN 8 - 23 mg/dL 36  53  34   Creatinine 0.61 - 1.24 mg/dL 1.95  2.42  1.76   Sodium 135 - 145 mmol/L 143  137  136   Potassium 3.5 - 5.1 mmol/L 4.4  4.4  4.0   Chloride 98 - 111 mmol/L 111  112  109   CO2 22 - 32 mmol/L 28  20  25    Calcium 8.9 - 10.3 mg/dL 9.0  8.2  7.9   Total Protein 6.5 - 8.1 g/dL 7.6  6.9  6.9   Total Bilirubin 0.3 - 1.2 mg/dL 0.6  0.8  0.7   Alkaline Phos 38 - 126 U/L 62  53  55   AST 15 - 41 U/L 17  15  13    ALT 0 - 44 U/L 21  19  15      DIAGNOSTIC IMAGING:  I have independently reviewed the scans and discussed with the patient. NM PET Image Restag (PS) Skull Base To Thigh  Result Date: 02/24/2022 CLINICAL DATA:  Subsequent treatment strategy for lymphocytic lymphoma. EXAM: NUCLEAR MEDICINE PET SKULL BASE TO THIGH TECHNIQUE: 9.55 mCi F-18 FDG was injected intravenously. Full-ring PET imaging was performed from the skull base to thigh after the radiotracer. CT data was obtained and used for attenuation correction and anatomic localization. Fasting blood glucose: 154 mg/dl COMPARISON:  Prior PET-CT 10/14/2021 and 05/28/2018 FINDINGS: Mediastinal blood  pool activity: SUV max 2.82 Liver activity: SUV max 3.68 NECK: No enlarged or hypermetabolic neck nodes. Incidental CT findings: There are numerous tiny multi station lymph nodes bilaterally all decreased in size since the prior study. CHEST: Numerous small scattered mediastinal and hilar lymph nodes all significantly decreased in size since the prior examination and no longer demonstrating FDG uptake/hypermetabolism. No pulmonary lesions. Incidental CT findings: Stable vascular disease and chronic lung findings. ABDOMEN/PELVIS: Persistent diffuse hazy interstitial changes throughout the mesentery and retroperitoneum. Persistent mesenteric and retroperitoneal adenopathy but significant improvement when compared to the prior study. Measures 16.5 mm on  image 176/3. This previously measured 22 mm. No hypermetabolism is demonstrated. It previously had an SUV max of 3.20 and is now 2.30. Inter aortocaval lymph node measures 10 mm on image 178/3. This previously measured 14 mm. SUV max was 2.91 and is now 1.79. Bilateral obturator adenopathy is also significantly improved. Left-sided node measures 10 mm and previously measured 21 mm. The right-sided node measures 12 mm and previously measured 18 mm. No residual hypermetabolism. Left femoral node on image 236/3 measures 15.5 mm and previously measured 22 mm. On the prior study SUV max was 2.64 and is now 1.32. The enlarged left inguinal node on the prior study measured 23 mm and now measures 11 mm. SUV max was 3.79 and is now 1.16. Stable mild splenomegaly but no hypermetabolism. Incidental CT findings: Stable advanced atherosclerotic calcifications involving the aorta and branch vessels. Remote surgical changes involving the transverse colon. Stable prostate gland enlargement and evidence of prior TURP. SKELETON: No significant bony findings. Mild hypermetabolism is noted between the ribcage in the scapula. This is stable and likely a fractional process. Incidental CT findings: none IMPRESSION: 1. PET-CT findings suggest a good response to treatment. Significant interval decrease in size of the lymphadenopathy in the neck, chest, abdomen and pelvis and no significant residual hypermetabolism ([place] 1). 2. Stable mild splenomegaly. 3. No findings for osseous involvement. 4. Stable vascular disease and prostatomegaly Electronically Signed   By: Marijo Sanes M.D.   On: 02/24/2022 11:31   CT RENAL STONE STUDY  Result Date: 02/18/2022 CLINICAL DATA:  Hydronephrosis with decreased urine output. Back pain. History of diabetes and prostate cancer. Left inguinal node excisional biopsy 11/01/2021 demonstrated small lymphocytic lymphoma. * Tracking Code: BO * EXAM: CT ABDOMEN AND PELVIS WITHOUT CONTRAST TECHNIQUE:  Multidetector CT imaging of the abdomen and pelvis was performed following the standard protocol without IV contrast. RADIATION DOSE REDUCTION: This exam was performed according to the departmental dose-optimization program which includes automated exposure control, adjustment of the mA and/or kV according to patient size and/or use of iterative reconstruction technique. COMPARISON:  Abdominopelvic CT 09/08/2021 and 04/05/2018. PET-CT 10/14/2021 and 05/28/2018. FINDINGS: Lower chest: The ill-defined nodularity previously demonstrated at the right lung base has resolved. No suspicious residual nodularity. Mild subpleural reticulation. No significant pleural or pericardial effusion. There is mild aortic and coronary artery atherosclerosis. Hepatobiliary: No focal hepatic abnormalities are identified on noncontrast imaging. No evidence of gallstones, gallbladder wall thickening or biliary dilatation. Pancreas: Unremarkable. No pancreatic ductal dilatation or surrounding inflammatory changes. Spleen: Splenomegaly has minimally improved. No focal splenic abnormality identified. Adrenals/Urinary Tract: Both adrenal glands appear normal. Punctate nonobstructing bilateral renal calculi. No evidence of ureteral calculus or hydronephrosis. The kidneys appear stable with small low-density lesions bilaterally which are incompletely characterized without contrast, although likely represent cysts. Perinephric soft tissue stranding has improved. The bladder appears unremarkable for its degree of distention. Stomach/Bowel: No enteric  contrast administered. The stomach appears unremarkable for its degree of distension. No evidence of bowel wall thickening, distention or surrounding inflammatory change. Stable postsurgical changes from previous right hemicolectomy. Vascular/Lymphatic: Previously demonstrated retroperitoneal and pelvic lymphadenopathy has definitely improved. A left periaortic node measuring 1.1 cm on image 34/2  previously measured 1.6 cm. An aortocaval node measuring 1.0 cm on image 32/2 previously measured 1.3 cm. A left external iliac node measuring 1.3 cm on image 71/2 previously measured 1.9 cm. Inguinal adenopathy has resolved, with postsurgical changes in the left groin from excisional biopsy. Retroperitoneal and pelvic soft tissue stranding has also improved. Diffuse aortic and branch vessel atherosclerosis without evidence of aneurysm. Reproductive: Similar moderate enlargement of the prostate gland with a central TUR defect. Other: Stable postsurgical changes in the anterior abdominal wall. No ascites or peritoneal nodularity. Musculoskeletal: No acute or significant osseous findings. Degenerative and postsurgical changes throughout the lumbar spine. Stable mixed sclerotic lesion in the intertrochanteric region of the left femur. IMPRESSION: 1. Interval improvement in retroperitoneal, pelvic and inguinal adenopathy consistent with response to therapy. 2. Splenomegaly has also minimally improved. No focal lesions are identified within the visceral organs on noncontrast imaging. 3. Interval resolution of presumed inflammatory process at the right lung base on previous CT. 4. Punctate nonobstructing bilateral renal calculi. 5. Prostatomegaly with chronic TUR defect. Electronically Signed   By: Richardean Sale M.D.   On: 02/18/2022 11:55     ASSESSMENT:  1.  Stage III small lymphocytic lymphoma: - Recent CT for hematuria work-up on 04/05/2018 showed several retroperitoneal lymph nodes measuring 1.2 cm and pelvic lymph nodes bilaterally, largest in the left obturator region measuring 2.2 cm. -Patient does not have any B symptoms including fevers, night sweats or weight loss. - CT scan from 2010 done at [facility] also showed lymphadenopathy and splenomegaly.  - PET/CT scan on 05/28/2018 showed very low metabolic activity associated with enlarged pelvic and retroperitoneal and axillary nodes.   Spleen and bone marrow are normal.  We discussed the normal progression of low-grade lymphomas in detail. - Ultrasound of the abdomen from 05/30/2020 from [facility] showed normal liver.  Splenomegaly measuring 13.4 x 7.8 x 15.4 cm. - he has history of lymphadenopathy in the retroperitoneal region dating back to 2010.  He does not have any B symptoms or cytopenias. - His renal function has been worsening lately.  Renal ultrasound on 08/09/2021 showed bilateral cortical irregularity consistent with scarring with normal renal echogenicity.  Mild left hydronephrosis. - CT renal study on 09/08/2021: Splenomegaly.  New para-aortic lymphadenopathy.  Hazy retroperitoneum surrounding the kidneys and aortic adenopathy.  Lymph node left of the aorta at the level of the left kidney measures 15 mm enlarged from prior PET scan.  Multiple periaortic lymph nodes similar in size.  Adenopathy extends along the iliac chains with bulky external iliac nodes. - LDH level is normal.  The creatinine is 1.73. - PET scan (10/18/2021): Mildly metabolic adenopathy above and below the diaphragm with mild splenomegaly, worsened since PET scan from 2019.  SUV of the lymph nodes is less than 5.  Left periaortic lymph node at the level of the left kidney measures 1.8 cm in short axis with SUV 3.2, previously measuring 6 mm on PET scan from 2019.  Left inguinal lymph node measures 2.1 cm with SUV 3.8.  Hazy retroperitoneal and mesenteric stranding with low-level associated metabolic activity in the left infrarenal retroperitoneum with SUV 3.1. - There is a question of whether this  retroperitoneal/mesenteric stranding is contributing to his hydronephrosis causing worsening renal function. - Left inguinal lymph node biopsy (11/01/2021): Small lymphocytic lymphoma - CLL FISH panel: Trisomy 12 (neutral prognosis), deletion 13 q. 14 - Indication for treatment: Threatened endorgan function - Zanubrutinib 160 mg twice daily started on  11/18/2021. - PET scan (02/24/2022): Good response to treatment with significant interval decrease in size of lymphadenopathy in the neck, chest, abdomen and pelvis and no significant residual hypermetabolic some ([place] 1).  Stable mild splenomegaly.   PLAN:  1.  Stage III small lymphocytic lymphoma: - We reviewed PET scan from 02/24/2022.  PET scan showed overall improvement with decrease in size of lymphadenopathy and improved hypermetabolism.  Stable mild splenomegaly. - Reviewed labs today which showed normal white count and platelet count.  Hemoglobin improved with iron supplementation.  Creatinine also improved to 1.95.  Sugar is 133. - Zanubrutinib can cause elevated blood pressure and blood sugars.  His blood pressures are in the range of systolic 147-092.  I have recommended optimization of blood pressures and start antidiabetic medication.  We will refer him back to Dr. Manuella Ghazi. - He reported self-contained nosebleeds twice in the last 1 month when he tried to blow nose.  Zanubrutinib can also increase risk of bleeding.  He is not on any antiplatelet medication. - Continues on Ibrutinib on 60 mg twice daily. - RTC 2 months for follow-up with repeat labs.     2.  Mild normocytic anemia: - Combination anemia from CKD and iron deficiency. - Continue iron tablet daily.  Ferritin is 73 and percent saturation 24.  Hemoglobin improved to 12.6 from 11.3. - PET scan showed improvement in retroperitoneal adenopathy.  3.  CKD: - He was started on telmisartan 10 mg daily around 11/17/2021. - Today creatinine has improved to 1.95 from 2.42 at previous visit.  Zanubrutinib can cause slightly elevated creatinine levels without any deterioration in kidney function.  4.  TLS prophylaxis: - Continue allopurinol 300 mg daily.   Orders placed this encounter:  No orders of the defined types were placed in this encounter.    Derek Jack, MD [place] [phone]

## 2022-03-17 NOTE — Progress Notes (Signed)
Patient is taking Brukinsa as prescribed.  He has not missed any doses and reports no side effects at this time.   

## 2022-03-17 NOTE — Patient Instructions (Addendum)
[place] at [facility] Discharge Instructions   You were seen and examined today by Dr. Delton Coombes.  He reviewed the results of your PET scan which shows a good response to treatment.  He reviewed the results of part of your lab work. Your CBC is normal. Your kidney, liver, and blood sugar results are pending.   Continue Brukinsa as prescribed.   Return as scheduled in 2 months.    Thank you for choosing [place] at [facility] to provide your oncology and hematology care.  To afford each patient quality time with our provider, please arrive at least 15 minutes before your scheduled appointment time.   If you have a lab appointment with the [facility] please come in thru the Main Entrance and check in at the main information desk.  You need to re-schedule your appointment should you arrive 10 or more minutes late.  We strive to give you quality time with our providers, and arriving late affects you and other patients whose appointments are after yours.  Also, if you no show three or more times for appointments you may be dismissed from the clinic at the providers discretion.     Again, thank you for choosing [facility].  Our hope is that these requests will decrease the amount of time that you wait before being seen by our physicians.       _____________________________________________________________  Should you have questions after your visit to [facility], please contact our office at [phone] and follow the prompts.  Our office hours are 8:00 a.m. and 4:30 p.m. Monday - Friday.  Please note that voicemails left after 4:00 p.m. may not be returned until the following business day.  We are closed weekends and major holidays.  You do have access to a nurse 24-7, just call the main number to the clinic [phone] and do not press any options, hold on the line and a nurse will answer the phone.    For  prescription refill requests, have your pharmacy contact our office and allow 72 hours.    Due to Covid, you will need to wear a mask upon entering the hospital. If you do not have a mask, a mask will be given to you at the Main Entrance upon arrival. For doctor visits, patients may have 1 support person age 45 or older with them. For treatment visits, patients can not have anyone with them due to social distancing guidelines and our immunocompromised population.

## 2022-03-18 DIAGNOSIS — N183 Chronic kidney disease, stage 3 unspecified: Secondary | ICD-10-CM | POA: Diagnosis not present

## 2022-03-18 DIAGNOSIS — I10 Essential (primary) hypertension: Secondary | ICD-10-CM | POA: Diagnosis not present

## 2022-03-18 DIAGNOSIS — C8590 Non-Hodgkin lymphoma, unspecified, unspecified site: Secondary | ICD-10-CM | POA: Diagnosis not present

## 2022-03-18 DIAGNOSIS — J439 Emphysema, unspecified: Secondary | ICD-10-CM | POA: Diagnosis not present

## 2022-03-18 DIAGNOSIS — Z6829 Body mass index (BMI) 29.0-29.9, adult: Secondary | ICD-10-CM | POA: Diagnosis not present

## 2022-03-18 DIAGNOSIS — Z299 Encounter for prophylactic measures, unspecified: Secondary | ICD-10-CM | POA: Diagnosis not present

## 2022-03-24 DIAGNOSIS — L03116 Cellulitis of left lower limb: Secondary | ICD-10-CM | POA: Diagnosis not present

## 2022-03-24 DIAGNOSIS — T148XXA Other injury of unspecified body region, initial encounter: Secondary | ICD-10-CM | POA: Diagnosis not present

## 2022-03-30 ENCOUNTER — Other Ambulatory Visit (HOSPITAL_COMMUNITY): Payer: Self-pay

## 2022-03-31 ENCOUNTER — Other Ambulatory Visit (HOSPITAL_COMMUNITY): Payer: Self-pay

## 2022-03-31 DIAGNOSIS — L03116 Cellulitis of left lower limb: Secondary | ICD-10-CM | POA: Diagnosis not present

## 2022-03-31 DIAGNOSIS — T148XXD Other injury of unspecified body region, subsequent encounter: Secondary | ICD-10-CM | POA: Diagnosis not present

## 2022-03-31 DIAGNOSIS — T148XXA Other injury of unspecified body region, initial encounter: Secondary | ICD-10-CM | POA: Diagnosis not present

## 2022-04-05 ENCOUNTER — Other Ambulatory Visit (HOSPITAL_COMMUNITY): Payer: Self-pay

## 2022-04-07 ENCOUNTER — Other Ambulatory Visit (HOSPITAL_COMMUNITY): Payer: Self-pay

## 2022-04-21 DIAGNOSIS — N170 Acute kidney failure with tubular necrosis: Secondary | ICD-10-CM | POA: Diagnosis not present

## 2022-04-21 DIAGNOSIS — D638 Anemia in other chronic diseases classified elsewhere: Secondary | ICD-10-CM | POA: Diagnosis not present

## 2022-04-21 DIAGNOSIS — N1832 Chronic kidney disease, stage 3b: Secondary | ICD-10-CM | POA: Diagnosis not present

## 2022-04-21 DIAGNOSIS — R809 Proteinuria, unspecified: Secondary | ICD-10-CM | POA: Diagnosis not present

## 2022-04-22 DIAGNOSIS — Z23 Encounter for immunization: Secondary | ICD-10-CM | POA: Diagnosis not present

## 2022-04-22 DIAGNOSIS — Z299 Encounter for prophylactic measures, unspecified: Secondary | ICD-10-CM | POA: Diagnosis not present

## 2022-04-22 DIAGNOSIS — E1165 Type 2 diabetes mellitus with hyperglycemia: Secondary | ICD-10-CM | POA: Diagnosis not present

## 2022-04-22 DIAGNOSIS — M25461 Effusion, right knee: Secondary | ICD-10-CM | POA: Diagnosis not present

## 2022-04-22 DIAGNOSIS — I10 Essential (primary) hypertension: Secondary | ICD-10-CM | POA: Diagnosis not present

## 2022-04-22 DIAGNOSIS — Z6829 Body mass index (BMI) 29.0-29.9, adult: Secondary | ICD-10-CM | POA: Diagnosis not present

## 2022-04-26 ENCOUNTER — Other Ambulatory Visit (HOSPITAL_COMMUNITY): Payer: Self-pay

## 2022-04-28 ENCOUNTER — Other Ambulatory Visit (HOSPITAL_COMMUNITY): Payer: Self-pay | Admitting: Hematology

## 2022-04-28 ENCOUNTER — Other Ambulatory Visit (HOSPITAL_COMMUNITY): Payer: Self-pay

## 2022-04-28 ENCOUNTER — Other Ambulatory Visit: Payer: Self-pay | Admitting: *Deleted

## 2022-04-28 DIAGNOSIS — C8300 Small cell B-cell lymphoma, unspecified site: Secondary | ICD-10-CM

## 2022-04-28 DIAGNOSIS — N1832 Chronic kidney disease, stage 3b: Secondary | ICD-10-CM | POA: Diagnosis not present

## 2022-04-28 DIAGNOSIS — N200 Calculus of kidney: Secondary | ICD-10-CM | POA: Diagnosis not present

## 2022-04-28 DIAGNOSIS — R809 Proteinuria, unspecified: Secondary | ICD-10-CM | POA: Diagnosis not present

## 2022-04-28 DIAGNOSIS — D638 Anemia in other chronic diseases classified elsewhere: Secondary | ICD-10-CM | POA: Diagnosis not present

## 2022-04-28 MED ORDER — BRUKINSA 80 MG PO CAPS
160.0000 mg | ORAL_CAPSULE | Freq: Two times a day (BID) | ORAL | 1 refills | Status: DC
  Filled 2022-04-28: qty 120, 30d supply, fill #0
  Filled 2022-05-31: qty 120, 30d supply, fill #1

## 2022-04-28 NOTE — Telephone Encounter (Signed)
Refill approved for zanubrutinib.  Patient tolerating and is to continue therapy.

## 2022-05-06 ENCOUNTER — Other Ambulatory Visit (HOSPITAL_COMMUNITY): Payer: Self-pay

## 2022-05-24 ENCOUNTER — Other Ambulatory Visit (HOSPITAL_COMMUNITY): Payer: Self-pay

## 2022-05-25 ENCOUNTER — Inpatient Hospital Stay: Payer: PPO

## 2022-05-25 ENCOUNTER — Encounter: Payer: Self-pay | Admitting: Hematology

## 2022-05-25 ENCOUNTER — Inpatient Hospital Stay: Payer: PPO | Attending: Hematology | Admitting: Hematology

## 2022-05-25 DIAGNOSIS — C8300 Small cell B-cell lymphoma, unspecified site: Secondary | ICD-10-CM | POA: Diagnosis not present

## 2022-05-25 DIAGNOSIS — Z808 Family history of malignant neoplasm of other organs or systems: Secondary | ICD-10-CM | POA: Diagnosis not present

## 2022-05-25 DIAGNOSIS — Z87891 Personal history of nicotine dependence: Secondary | ICD-10-CM | POA: Diagnosis not present

## 2022-05-25 DIAGNOSIS — R739 Hyperglycemia, unspecified: Secondary | ICD-10-CM | POA: Diagnosis not present

## 2022-05-25 DIAGNOSIS — D631 Anemia in chronic kidney disease: Secondary | ICD-10-CM | POA: Diagnosis not present

## 2022-05-25 DIAGNOSIS — Z79899 Other long term (current) drug therapy: Secondary | ICD-10-CM | POA: Insufficient documentation

## 2022-05-25 DIAGNOSIS — I129 Hypertensive chronic kidney disease with stage 1 through stage 4 chronic kidney disease, or unspecified chronic kidney disease: Secondary | ICD-10-CM | POA: Insufficient documentation

## 2022-05-25 DIAGNOSIS — N183 Chronic kidney disease, stage 3 unspecified: Secondary | ICD-10-CM | POA: Insufficient documentation

## 2022-05-25 LAB — CBC WITH DIFFERENTIAL/PLATELET
Abs Immature Granulocytes: 0.04 10*3/uL (ref 0.00–0.07)
Basophils Absolute: 0.1 10*3/uL (ref 0.0–0.1)
Basophils Relative: 1 %
Eosinophils Absolute: 0.3 10*3/uL (ref 0.0–0.5)
Eosinophils Relative: 4 %
HCT: 37.5 % — ABNORMAL LOW (ref 39.0–52.0)
Hemoglobin: 12.5 g/dL — ABNORMAL LOW (ref 13.0–17.0)
Immature Granulocytes: 1 %
Lymphocytes Relative: 44 %
Lymphs Abs: 3.8 10*3/uL (ref 0.7–4.0)
MCH: 31.3 pg (ref 26.0–34.0)
MCHC: 33.3 g/dL (ref 30.0–36.0)
MCV: 94 fL (ref 80.0–100.0)
Monocytes Absolute: 0.4 10*3/uL (ref 0.1–1.0)
Monocytes Relative: 4 %
Neutro Abs: 3.9 10*3/uL (ref 1.7–7.7)
Neutrophils Relative %: 46 %
Platelets: 196 10*3/uL (ref 150–400)
RBC: 3.99 MIL/uL — ABNORMAL LOW (ref 4.22–5.81)
RDW: 13.7 % (ref 11.5–15.5)
WBC: 8.5 10*3/uL (ref 4.0–10.5)
nRBC: 0 % (ref 0.0–0.2)

## 2022-05-25 LAB — COMPREHENSIVE METABOLIC PANEL
ALT: 18 U/L (ref 0–44)
AST: 14 U/L — ABNORMAL LOW (ref 15–41)
Albumin: 3.4 g/dL — ABNORMAL LOW (ref 3.5–5.0)
Alkaline Phosphatase: 54 U/L (ref 38–126)
Anion gap: 7 (ref 5–15)
BUN: 49 mg/dL — ABNORMAL HIGH (ref 8–23)
CO2: 24 mmol/L (ref 22–32)
Calcium: 8.7 mg/dL — ABNORMAL LOW (ref 8.9–10.3)
Chloride: 108 mmol/L (ref 98–111)
Creatinine, Ser: 2.34 mg/dL — ABNORMAL HIGH (ref 0.61–1.24)
GFR, Estimated: 28 mL/min — ABNORMAL LOW (ref >60)
Glucose, Bld: 151 mg/dL — ABNORMAL HIGH (ref 70–99)
Potassium: 5 mmol/L (ref 3.5–5.1)
Sodium: 139 mmol/L (ref 135–145)
Total Bilirubin: 0.7 mg/dL (ref 0.3–1.2)
Total Protein: 6.7 g/dL (ref 6.5–8.1)

## 2022-05-25 LAB — LACTATE DEHYDROGENASE: LDH: 125 U/L (ref 98–192)

## 2022-05-25 NOTE — Progress Notes (Signed)
[place], [place] [address]   CLINIC:  Medical Oncology/Hematology  PCP:  Monico Blitz, MD [address] [place] [address] [phone]   REASON FOR VISIT:  Follow-up for stage III small lymphocytic lymphoma  PRIOR THERAPY: none  NGS Results: not done  CURRENT THERAPY: Zanubrutinib 160 mg twice daily.  BRIEF ONCOLOGIC HISTORY:  Oncology History   No history exists.    CANCER STAGING:  Cancer Staging  Small lymphocytic lymphoma ([place]) Staging form: Hodgkin and Non-Hodgkin Lymphoma, AJCC 8th Edition - Clinical stage from 11/10/2021: Stage III (Small lymphocytic leukemia) - Unsigned   INTERVAL HISTORY:  Kevin Hooper, a 76 y.o. male, seen for follow-up and toxicity assessment for small lymphocytic lymphoma.  He is taking his on Ibrutinib 160 mg twice daily.  Denies any GI side effects.  Denies any bleeding issues.  REVIEW OF SYSTEMS:  Review of Systems  Constitutional:  Negative for appetite change and fatigue.  Gastrointestinal:  Negative for diarrhea, nausea and vomiting.  All other systems reviewed and are negative.   PAST MEDICAL/SURGICAL HISTORY:  Past Medical History:  Diagnosis Date   Arthritis    Cataracts, bilateral 11/2016   Chronic kidney disease    sTAGE 3   Diabetes mellitus without complication ([place])    Gout    Hyperlipidemia    Hypertension    Joint ache    Prostate atrophy 2018   Past Surgical History:  Procedure Laterality Date   COLON RESECTION  2015   EXCISION OF SKIN TAG Left 11/01/2021   Procedure: EXCISION OF SKIN TAG;  Surgeon: Virl Cagey, MD;  Location: [facility];  Service: General;  Laterality: Left;   HERNIA REPAIR     UMBILICAL   INGUINAL LYMPH NODE BIOPSY Left 11/01/2021   Procedure: INGUINAL LYMPH NODE BIOPSY- LEFT;  Surgeon: Virl Cagey, MD;  Location: [facility];  Service: General;  Laterality: Left;   [place] Left    Arm s/p accident    REPLACEMENT TOTAL KNEE BILATERAL Bilateral 1990   TRANSURETHRAL RESECTION OF PROSTATE N/A 07/04/2017   Procedure: TRANSURETHRAL RESECTION OF THE PROSTATE (TURP);  Surgeon: Irine Seal, MD;  Location: [facility];  Service: Urology;  Laterality: N/A;    SOCIAL HISTORY:  Social History   Socioeconomic History   Marital status: Married    Spouse name: Not on file   Number of children: 2   Years of education: 12+   Highest education level: Not on file  Occupational History   Occupation: Retired  Tobacco Use   Smoking status: Former    Packs/day: 2.00    Years: 25.00    Total pack years: 50.00    Types: Cigarettes    Quit date: 04/28/1992    Years since quitting: 30.0   Smokeless tobacco: Never  Vaping Use   Vaping Use: Never used  Substance and Sexual Activity   Alcohol use: Yes    Comment: Occasional   Drug use: No   Sexual activity: Not on file    Comment: Married  Other Topics Concern   Not on file  Social History Narrative   Lives at home w/ his wife   Right-handed   Caffeine: occasional tea   Social Determinants of Health   Financial Resource Strain: Low Risk  (06/16/2020)   Overall Financial Resource Strain (CARDIA)    Difficulty of Paying Living Expenses: Not hard at all  Food  Insecurity: No Food Insecurity (06/16/2020)   Hunger Vital Sign    Worried About Running Out of Food in the Last Year: Never true    Ran Out of Food in the Last Year: Never true  Transportation Needs: No Transportation Needs (06/16/2020)   PRAPARE - Hydrologist (Medical): No    Lack of Transportation (Non-Medical): No  Physical Activity: Insufficiently Active (06/16/2020)   Exercise Vital Sign    Days of Exercise per Week: 7 days    Minutes of Exercise per Session: 20 min  Stress: No Stress Concern Present (06/16/2020)   [place]    Feeling of Stress : Not at all  Social Connections:  Moderately Integrated (06/16/2020)   Social Connection and Isolation Panel [NHANES]    Frequency of Communication with Friends and Family: More than three times a week    Frequency of Social Gatherings with Friends and Family: Twice a week    Attends Religious Services: More than 4 times per year    Active Member of [employer] or Organizations: No    Attends Archivist Meetings: Never    Marital Status: Married  Human resources officer Violence: Not At Risk (06/16/2020)   Humiliation, Afraid, Rape, and Kick questionnaire    Fear of Current or Ex-Partner: No    Emotionally Abused: No    Physically Abused: No    Sexually Abused: No    FAMILY HISTORY:  Family History  Problem Relation Age of Onset   COPD Father    Throat cancer Father    Cancer Brother     CURRENT MEDICATIONS:  Current Outpatient Medications  Medication Sig Dispense Refill   acetaminophen (TYLENOL) 650 MG CR tablet Take 650 mg by mouth every 8 (eight) hours as needed (Arthritis).     albuterol (PROVENTIL HFA;VENTOLIN HFA) 108 (90 Base) MCG/ACT inhaler Inhale 2 puffs into the lungs every 6 (six) hours as needed for wheezing or shortness of breath.      allopurinol (ZYLOPRIM) 300 MG tablet TAKE ONE TABLET BY MOUTH ONCE DAILY 30 tablet 3   amLODipine (NORVASC) 10 MG tablet Take 10 mg by mouth daily.     b complex vitamins capsule Take 1 capsule by mouth daily. Folic acid     Blood Glucose Monitoring Suppl (ONETOUCH VERIO FLEX SYSTEM) w/Device KIT daily.     dapagliflozin propanediol (FARXIGA) 5 MG TABS tablet Take 5 mg by mouth daily.     glucosamine-chondroitin 500-400 MG tablet Take 1 tablet by mouth daily.     hydrochlorothiazide (HYDRODIURIL) 25 MG tablet Take 25 mg by mouth daily.     Lancets (ONETOUCH DELICA PLUS [id number]) [place] See admin instructions.     MEGARED OMEGA-3 KRILL OIL PO Take by mouth daily.     metoprolol succinate (TOPROL-XL) 100 MG 24 hr tablet Take 100 mg by mouth daily.   1   Multiple  Vitamins-Minerals (MULTIVITAMIN WITH MINERALS) tablet Take 1 tablet by mouth daily.     ONETOUCH VERIO test strip 1 each daily.     rosuvastatin (CRESTOR) 5 MG tablet Take 5 mg by mouth at bedtime.     telmisartan (MICARDIS) 20 MG tablet Take by mouth.     vitamin C (ASCORBIC ACID) 500 MG tablet Take 1,000 mg by mouth daily.     zanubrutinib (BRUKINSA) 80 MG capsule Take 2 tablets (160 mg) by mouth 2 (two) times daily. 120 capsule 1   No current  facility-administered medications for this visit.    ALLERGIES:  No Known Allergies  PHYSICAL EXAM:  Performance status (ECOG): 1 - Symptomatic but completely ambulatory  There were no vitals filed for this visit.  Wt Readings from Last 3 Encounters:  03/17/22 181 lb 14.1 oz (82.5 kg)  02/16/22 184 lb 9.6 oz (83.7 kg)  01/04/22 184 lb 15.5 oz (83.9 kg)   Physical Exam Vitals reviewed.  Constitutional:      Appearance: Normal appearance.  Cardiovascular:     Rate and Rhythm: Normal rate and regular rhythm.     Pulses: Normal pulses.     Heart sounds: Normal heart sounds.  Pulmonary:     Effort: Pulmonary effort is normal.     Breath sounds: Normal breath sounds.  Musculoskeletal:     Right lower leg: No edema.     Left lower leg: No edema.  Neurological:     General: No focal deficit present.     Mental Status: He is alert and oriented to person, place, and time.  Psychiatric:        Mood and Affect: Mood normal.        Behavior: Behavior normal.      LABORATORY DATA:  I have reviewed the labs as listed.     Latest Ref Rng & Units 05/25/2022   10:09 AM 03/17/2022    8:33 AM 02/16/2022    3:01 PM  CBC  WBC 4.0 - 10.5 K/uL 8.5  8.1  10.4   Hemoglobin 13.0 - 17.0 g/dL 12.5  12.6  11.3   Hematocrit 39.0 - 52.0 % 37.5  36.7  32.5   Platelets 150 - 400 K/uL 196  224  228       Latest Ref Rng & Units 05/25/2022   10:09 AM 03/17/2022    8:33 AM 02/16/2022    3:01 PM  CMP  Glucose 70 - 99 mg/dL 151  133  156   BUN 8 - 23  mg/dL 49  36  53   Creatinine 0.61 - 1.24 mg/dL 2.34  1.95  2.42   Sodium 135 - 145 mmol/L 139  143  137   Potassium 3.5 - 5.1 mmol/L 5.0  4.4  4.4   Chloride 98 - 111 mmol/L 108  111  112   CO2 22 - 32 mmol/L 24  28  20    Calcium 8.9 - 10.3 mg/dL 8.7  9.0  8.2   Total Protein 6.5 - 8.1 g/dL 6.7  7.6  6.9   Total Bilirubin 0.3 - 1.2 mg/dL 0.7  0.6  0.8   Alkaline Phos 38 - 126 U/L 54  62  53   AST 15 - 41 U/L 14  17  15    ALT 0 - 44 U/L 18  21  19      DIAGNOSTIC IMAGING:  I have independently reviewed the scans and discussed with the patient. No results found.   ASSESSMENT:  1.  Stage III small lymphocytic lymphoma: - Recent CT for hematuria work-up on 04/05/2018 showed several retroperitoneal lymph nodes measuring 1.2 cm and pelvic lymph nodes bilaterally, largest in the left obturator region measuring 2.2 cm. -Patient does not have any B symptoms including fevers, night sweats or weight loss. - CT scan from 2010 done at [facility] also showed lymphadenopathy and splenomegaly.  - PET/CT scan on 05/28/2018 showed very low metabolic activity associated with enlarged pelvic and retroperitoneal and axillary nodes.  Spleen and bone marrow are  normal.  We discussed the normal progression of low-grade lymphomas in detail. - Ultrasound of the abdomen from 05/30/2020 from [facility] showed normal liver.  Splenomegaly measuring 13.4 x 7.8 x 15.4 cm. - he has history of lymphadenopathy in the retroperitoneal region dating back to 2010.  He does not have any B symptoms or cytopenias. - His renal function has been worsening lately.  Renal ultrasound on 08/09/2021 showed bilateral cortical irregularity consistent with scarring with normal renal echogenicity.  Mild left hydronephrosis. - CT renal study on 09/08/2021: Splenomegaly.  New para-aortic lymphadenopathy.  Hazy retroperitoneum surrounding the kidneys and aortic adenopathy.  Lymph node left of the aorta at the level of the left  kidney measures 15 mm enlarged from prior PET scan.  Multiple periaortic lymph nodes similar in size.  Adenopathy extends along the iliac chains with bulky external iliac nodes. - LDH level is normal.  The creatinine is 1.73. - PET scan (10/18/2021): Mildly metabolic adenopathy above and below the diaphragm with mild splenomegaly, worsened since PET scan from 2019.  SUV of the lymph nodes is less than 5.  Left periaortic lymph node at the level of the left kidney measures 1.8 cm in short axis with SUV 3.2, previously measuring 6 mm on PET scan from 2019.  Left inguinal lymph node measures 2.1 cm with SUV 3.8.  Hazy retroperitoneal and mesenteric stranding with low-level associated metabolic activity in the left infrarenal retroperitoneum with SUV 3.1. - There is a question of whether this retroperitoneal/mesenteric stranding is contributing to his hydronephrosis causing worsening renal function. - Left inguinal lymph node biopsy (11/01/2021): Small lymphocytic lymphoma - CLL FISH panel: Trisomy 12 (neutral prognosis), deletion 13 q. 14 - Indication for treatment: Threatened endorgan function - Zanubrutinib 160 mg twice daily started on 11/18/2021. - PET scan (02/24/2022): Good response to treatment with significant interval decrease in size of lymphadenopathy in the neck, chest, abdomen and pelvis and no significant residual hypermetabolic some ([place] 1).  Stable mild splenomegaly.   PLAN:  1.  Stage III small lymphocytic lymphoma: - PET scan on 02/24/2022: Overall improvement with decrease in size of lymphadenopathy and improved hypermetabolic some.  Stable mild splenomegaly. - Reviewed labs from 05/23/2022 which showed glucose 151.  Creatinine 2.34.  LFTs are normal.  CBC shows normal white count and platelet count. - I have reviewed fasting blood sugars over the last 1 week which range between 122-153.  He reportedly had HbA1c in September at Dr. Trena Platt office which was less than 6. - He was started  on farxiga 5 mg daily on 04/28/2022.  We will repeat hemoglobin A1c at next visit in 2 months. - Continues zanubrutinib 160 mg twice daily. - RTC 2 months for follow-up with repeat labs and exam..     2.  Mild normocytic anemia: - Combination anemia from CKD and iron deficiency. - Continue iron tablet daily.  Last ferritin was 73 and percent saturation 24.  Hemoglobin today is 12.5.  3.  CKD: - Creatinine today is 2.34.  Continue follow-up with Dr. Theador Hawthorne.  4.  TLS prophylaxis: - Continue allopurinol 300 mg daily.  Will check uric acid at next visit.  If it is normal, will discontinue it.  5.  Hypertension: - He is taking telmisartan 20 mg every other day, Toprol-XL 100 mg daily, HCTZ 25 mg daily and Norvasc 10 mg daily. - Readings in the last week of his systolic blood pressure ranged from 129-160 mm, mostly around 130 mm.  Recommend that  he bring a log of blood pressure readings at next visit.   Orders placed this encounter:  Orders Placed This Encounter  Procedures   CBC with Differential   Comprehensive metabolic panel   Magnesium   Iron and TIBC ([facility] ONLY)   Ferritin   Lactate dehydrogenase   Hemoglobin A1c   Uric acid      Derek Jack, MD [place] [phone]

## 2022-05-25 NOTE — Patient Instructions (Signed)
[place] at [facility] Discharge Instructions   You were seen and examined today by Dr. Delton Coombes.  He reviewed the results of your lab work which are normal/stable.   Continue Brukinsa as prescribed.   Return as scheduled in 2 months.    Thank you for choosing [place] [facility] to provide your oncology and hematology care.  To afford each patient quality time with our provider, please arrive at least 15 minutes before your scheduled appointment time.   If you have a lab appointment with the [facility] please come in thru the Main Entrance and check in at the main information desk.  You need to re-schedule your appointment should you arrive 10 or more minutes late.  We strive to give you quality time with our providers, and arriving late affects you and other patients whose appointments are after yours.  Also, if you no show three or more times for appointments you may be dismissed from the clinic at the providers discretion.     Again, thank you for choosing [facility].  Our hope is that these requests will decrease the amount of time that you wait before being seen by our physicians.       _____________________________________________________________  Should you have questions after your visit to [facility], please contact our office at [phone] and follow the prompts.  Our office hours are 8:00 a.m. and 4:30 p.m. Monday - Friday.  Please note that voicemails left after 4:00 p.m. may not be returned until the following business day.  We are closed weekends and major holidays.  You do have access to a nurse 24-7, just call the main number to the clinic [phone] and do not press any options, hold on the line and a nurse will answer the phone.    For prescription refill requests, have your pharmacy contact our office and allow 72 hours.    Due to Covid, you will need to wear a mask upon entering  the hospital. If you do not have a mask, a mask will be given to you at the Main Entrance upon arrival. For doctor visits, patients may have 1 support person age 69 or older with them. For treatment visits, patients can not have anyone with them due to social distancing guidelines and our immunocompromised population.

## 2022-05-31 ENCOUNTER — Other Ambulatory Visit (HOSPITAL_COMMUNITY): Payer: Self-pay

## 2022-06-01 ENCOUNTER — Other Ambulatory Visit (HOSPITAL_COMMUNITY): Payer: Self-pay

## 2022-06-06 DIAGNOSIS — I10 Essential (primary) hypertension: Secondary | ICD-10-CM | POA: Diagnosis not present

## 2022-06-06 DIAGNOSIS — Z299 Encounter for prophylactic measures, unspecified: Secondary | ICD-10-CM | POA: Diagnosis not present

## 2022-06-06 DIAGNOSIS — M1711 Unilateral primary osteoarthritis, right knee: Secondary | ICD-10-CM | POA: Diagnosis not present

## 2022-06-06 DIAGNOSIS — Z6828 Body mass index (BMI) 28.0-28.9, adult: Secondary | ICD-10-CM | POA: Diagnosis not present

## 2022-06-06 DIAGNOSIS — J449 Chronic obstructive pulmonary disease, unspecified: Secondary | ICD-10-CM | POA: Diagnosis not present

## 2022-06-06 DIAGNOSIS — M1710 Unilateral primary osteoarthritis, unspecified knee: Secondary | ICD-10-CM | POA: Diagnosis not present

## 2022-06-22 DIAGNOSIS — C8300 Small cell B-cell lymphoma, unspecified site: Secondary | ICD-10-CM | POA: Diagnosis not present

## 2022-06-22 DIAGNOSIS — N1832 Chronic kidney disease, stage 3b: Secondary | ICD-10-CM | POA: Diagnosis not present

## 2022-06-22 DIAGNOSIS — N200 Calculus of kidney: Secondary | ICD-10-CM | POA: Diagnosis not present

## 2022-06-22 DIAGNOSIS — R809 Proteinuria, unspecified: Secondary | ICD-10-CM | POA: Diagnosis not present

## 2022-06-22 DIAGNOSIS — D638 Anemia in other chronic diseases classified elsewhere: Secondary | ICD-10-CM | POA: Diagnosis not present

## 2022-06-28 ENCOUNTER — Other Ambulatory Visit (HOSPITAL_COMMUNITY): Payer: Self-pay

## 2022-06-28 ENCOUNTER — Other Ambulatory Visit (HOSPITAL_COMMUNITY): Payer: Self-pay | Admitting: Hematology

## 2022-06-28 ENCOUNTER — Other Ambulatory Visit: Payer: Self-pay | Admitting: *Deleted

## 2022-06-28 DIAGNOSIS — C8300 Small cell B-cell lymphoma, unspecified site: Secondary | ICD-10-CM

## 2022-06-28 MED ORDER — BRUKINSA 80 MG PO CAPS
160.0000 mg | ORAL_CAPSULE | Freq: Two times a day (BID) | ORAL | 1 refills | Status: DC
  Filled 2022-06-28 – 2022-07-08 (×2): qty 120, 30d supply, fill #0
  Filled 2022-07-28: qty 120, 30d supply, fill #1

## 2022-06-28 NOTE — Telephone Encounter (Signed)
Refill for Brukinsa approved.  Patient is tolerating and is to continue therapy.

## 2022-07-04 DIAGNOSIS — Z299 Encounter for prophylactic measures, unspecified: Secondary | ICD-10-CM | POA: Diagnosis not present

## 2022-07-04 DIAGNOSIS — C9110 Chronic lymphocytic leukemia of B-cell type not having achieved remission: Secondary | ICD-10-CM | POA: Diagnosis not present

## 2022-07-04 DIAGNOSIS — Z6828 Body mass index (BMI) 28.0-28.9, adult: Secondary | ICD-10-CM | POA: Diagnosis not present

## 2022-07-04 DIAGNOSIS — L97909 Non-pressure chronic ulcer of unspecified part of unspecified lower leg with unspecified severity: Secondary | ICD-10-CM | POA: Diagnosis not present

## 2022-07-04 DIAGNOSIS — E11622 Type 2 diabetes mellitus with other skin ulcer: Secondary | ICD-10-CM | POA: Diagnosis not present

## 2022-07-04 DIAGNOSIS — I10 Essential (primary) hypertension: Secondary | ICD-10-CM | POA: Diagnosis not present

## 2022-07-08 ENCOUNTER — Other Ambulatory Visit (HOSPITAL_COMMUNITY): Payer: Self-pay

## 2022-07-12 ENCOUNTER — Other Ambulatory Visit: Payer: Self-pay

## 2022-07-16 DIAGNOSIS — R808 Other proteinuria: Secondary | ICD-10-CM | POA: Diagnosis not present

## 2022-07-16 DIAGNOSIS — N1832 Chronic kidney disease, stage 3b: Secondary | ICD-10-CM | POA: Diagnosis not present

## 2022-07-16 DIAGNOSIS — D472 Monoclonal gammopathy: Secondary | ICD-10-CM | POA: Diagnosis not present

## 2022-07-16 DIAGNOSIS — E211 Secondary hyperparathyroidism, not elsewhere classified: Secondary | ICD-10-CM | POA: Diagnosis not present

## 2022-07-21 ENCOUNTER — Other Ambulatory Visit (HOSPITAL_COMMUNITY): Payer: Self-pay

## 2022-07-28 ENCOUNTER — Other Ambulatory Visit (HOSPITAL_COMMUNITY): Payer: Self-pay

## 2022-08-02 ENCOUNTER — Inpatient Hospital Stay: Payer: PPO

## 2022-08-02 ENCOUNTER — Inpatient Hospital Stay: Payer: PPO | Attending: Hematology

## 2022-08-02 DIAGNOSIS — I10 Essential (primary) hypertension: Secondary | ICD-10-CM | POA: Insufficient documentation

## 2022-08-02 DIAGNOSIS — C8300 Small cell B-cell lymphoma, unspecified site: Secondary | ICD-10-CM

## 2022-08-02 DIAGNOSIS — D649 Anemia, unspecified: Secondary | ICD-10-CM | POA: Insufficient documentation

## 2022-08-02 DIAGNOSIS — E119 Type 2 diabetes mellitus without complications: Secondary | ICD-10-CM | POA: Insufficient documentation

## 2022-08-02 DIAGNOSIS — Z87891 Personal history of nicotine dependence: Secondary | ICD-10-CM | POA: Diagnosis not present

## 2022-08-02 DIAGNOSIS — N183 Chronic kidney disease, stage 3 unspecified: Secondary | ICD-10-CM | POA: Diagnosis not present

## 2022-08-02 DIAGNOSIS — Z808 Family history of malignant neoplasm of other organs or systems: Secondary | ICD-10-CM | POA: Diagnosis not present

## 2022-08-02 DIAGNOSIS — Z809 Family history of malignant neoplasm, unspecified: Secondary | ICD-10-CM | POA: Diagnosis not present

## 2022-08-02 DIAGNOSIS — C8305 Small cell B-cell lymphoma, lymph nodes of inguinal region and lower limb: Secondary | ICD-10-CM | POA: Insufficient documentation

## 2022-08-02 DIAGNOSIS — R739 Hyperglycemia, unspecified: Secondary | ICD-10-CM

## 2022-08-02 LAB — IRON AND TIBC
Iron: 79 ug/dL (ref 45–182)
Saturation Ratios: 28 % (ref 17.9–39.5)
TIBC: 284 ug/dL (ref 250–450)
UIBC: 205 ug/dL

## 2022-08-02 LAB — COMPREHENSIVE METABOLIC PANEL
ALT: 14 U/L (ref 0–44)
AST: 12 U/L — ABNORMAL LOW (ref 15–41)
Albumin: 3.4 g/dL — ABNORMAL LOW (ref 3.5–5.0)
Alkaline Phosphatase: 58 U/L (ref 38–126)
Anion gap: 7 (ref 5–15)
BUN: 52 mg/dL — ABNORMAL HIGH (ref 8–23)
CO2: 24 mmol/L (ref 22–32)
Calcium: 8.1 mg/dL — ABNORMAL LOW (ref 8.9–10.3)
Chloride: 104 mmol/L (ref 98–111)
Creatinine, Ser: 2.45 mg/dL — ABNORMAL HIGH (ref 0.61–1.24)
GFR, Estimated: 27 mL/min — ABNORMAL LOW (ref >60)
Glucose, Bld: 152 mg/dL — ABNORMAL HIGH (ref 70–99)
Potassium: 4.3 mmol/L (ref 3.5–5.1)
Sodium: 135 mmol/L (ref 135–145)
Total Bilirubin: 1.2 mg/dL (ref 0.3–1.2)
Total Protein: 6.9 g/dL (ref 6.5–8.1)

## 2022-08-02 LAB — CBC WITH DIFFERENTIAL/PLATELET
Abs Immature Granulocytes: 0.03 10*3/uL (ref 0.00–0.07)
Basophils Absolute: 0.1 10*3/uL (ref 0.0–0.1)
Basophils Relative: 1 %
Eosinophils Absolute: 0.3 10*3/uL (ref 0.0–0.5)
Eosinophils Relative: 3 %
HCT: 36.8 % — ABNORMAL LOW (ref 39.0–52.0)
Hemoglobin: 12.2 g/dL — ABNORMAL LOW (ref 13.0–17.0)
Immature Granulocytes: 0 %
Lymphocytes Relative: 26 %
Lymphs Abs: 2.2 10*3/uL (ref 0.7–4.0)
MCH: 31.1 pg (ref 26.0–34.0)
MCHC: 33.2 g/dL (ref 30.0–36.0)
MCV: 93.9 fL (ref 80.0–100.0)
Monocytes Absolute: 0.6 10*3/uL (ref 0.1–1.0)
Monocytes Relative: 7 %
Neutro Abs: 5.2 10*3/uL (ref 1.7–7.7)
Neutrophils Relative %: 63 %
Platelets: 173 10*3/uL (ref 150–400)
RBC: 3.92 MIL/uL — ABNORMAL LOW (ref 4.22–5.81)
RDW: 12.6 % (ref 11.5–15.5)
WBC: 8.4 10*3/uL (ref 4.0–10.5)
nRBC: 0 % (ref 0.0–0.2)

## 2022-08-02 LAB — FERRITIN: Ferritin: 72 ng/mL (ref 24–336)

## 2022-08-02 LAB — LACTATE DEHYDROGENASE: LDH: 138 U/L (ref 98–192)

## 2022-08-02 LAB — HEMOGLOBIN A1C
Hgb A1c MFr Bld: 6.1 % — ABNORMAL HIGH (ref 4.8–5.6)
Mean Plasma Glucose: 128 mg/dL

## 2022-08-02 LAB — URIC ACID: Uric Acid, Serum: 5.8 mg/dL (ref 3.7–8.6)

## 2022-08-04 ENCOUNTER — Inpatient Hospital Stay (HOSPITAL_BASED_OUTPATIENT_CLINIC_OR_DEPARTMENT_OTHER): Payer: PPO | Admitting: Hematology

## 2022-08-04 VITALS — BP 111/69 | HR 82 | Temp 98.3°F | Resp 16 | Wt 182.6 lb

## 2022-08-04 DIAGNOSIS — D631 Anemia in chronic kidney disease: Secondary | ICD-10-CM | POA: Diagnosis not present

## 2022-08-04 DIAGNOSIS — C8300 Small cell B-cell lymphoma, unspecified site: Secondary | ICD-10-CM

## 2022-08-04 DIAGNOSIS — N189 Chronic kidney disease, unspecified: Secondary | ICD-10-CM

## 2022-08-04 DIAGNOSIS — C8305 Small cell B-cell lymphoma, lymph nodes of inguinal region and lower limb: Secondary | ICD-10-CM | POA: Diagnosis not present

## 2022-08-04 MED ORDER — FARXIGA 10 MG PO TABS: 10.0000 mg | ORAL_TABLET | Freq: Every day | ORAL | 0 refills | Status: AC

## 2022-08-04 NOTE — Progress Notes (Signed)
[place], [place] [address]   CLINIC:  Medical Oncology/Hematology  PCP:  Monico Blitz, MD [address] [place] [address] [phone]   REASON FOR VISIT:  Follow-up for stage III small lymphocytic lymphoma  PRIOR THERAPY: none  NGS Results: not done  CURRENT THERAPY: Zanubrutinib 160 mg twice daily.  BRIEF ONCOLOGIC HISTORY:  Oncology History   No history exists.    CANCER STAGING:  Cancer Staging  Small lymphocytic lymphoma ([place]) Staging form: Hodgkin and Non-Hodgkin Lymphoma, AJCC 8th Edition - Clinical stage from 11/10/2021: Stage III (Small lymphocytic leukemia) - Unsigned   INTERVAL HISTORY:  Kevin Hooper, a 77 y.o. male, seen for follow-up and toxicity assessment for small lymphocytic lymphoma.  He is on zanubrutinib 160 mg twice daily.  Energy levels are 100%.  Denies any fevers, night sweats or weight loss.  Denies any recent infections.  REVIEW OF SYSTEMS:  Review of Systems  Constitutional:  Negative for appetite change and fatigue.  Gastrointestinal:  Negative for diarrhea, nausea and vomiting.  All other systems reviewed and are negative.   PAST MEDICAL/SURGICAL HISTORY:  Past Medical History:  Diagnosis Date   Arthritis    Cataracts, bilateral 11/2016   Chronic kidney disease    sTAGE 3   Diabetes mellitus without complication ([place])    Gout    Hyperlipidemia    Hypertension    Joint ache    Prostate atrophy 2018   Past Surgical History:  Procedure Laterality Date   COLON RESECTION  2015   EXCISION OF SKIN TAG Left 11/01/2021   Procedure: EXCISION OF SKIN TAG;  Surgeon: Virl Cagey, MD;  Location: [facility];  Service: General;  Laterality: Left;   HERNIA REPAIR     UMBILICAL   INGUINAL LYMPH NODE BIOPSY Left 11/01/2021   Procedure: INGUINAL LYMPH NODE BIOPSY- LEFT;  Surgeon: Virl Cagey, MD;  Location: [facility];  Service: General;  Laterality: Left;   [place] Left    Arm s/p accident   REPLACEMENT TOTAL KNEE BILATERAL Bilateral 1990   TRANSURETHRAL RESECTION OF PROSTATE N/A 07/04/2017   Procedure: TRANSURETHRAL RESECTION OF THE PROSTATE (TURP);  Surgeon: Irine Seal, MD;  Location: [facility];  Service: Urology;  Laterality: N/A;    SOCIAL HISTORY:  Social History   Socioeconomic History   Marital status: Married    Spouse name: Not on file   Number of children: 2   Years of education: 12+   Highest education level: Not on file  Occupational History   Occupation: Retired  Tobacco Use   Smoking status: Former    Packs/day: 2.00    Years: 25.00    Total pack years: 50.00    Types: Cigarettes    Quit date: 04/28/1992    Years since quitting: 30.2   Smokeless tobacco: Never  Vaping Use   Vaping Use: Never used  Substance and Sexual Activity   Alcohol use: Yes    Comment: Occasional   Drug use: No   Sexual activity: Not on file    Comment: Married  Other Topics Concern   Not on file  Social History Narrative   Lives at home w/ his wife   Right-handed   Caffeine: occasional tea   Social Determinants of Health   Financial Resource Strain: Low Risk  (06/16/2020)   Overall Financial Resource Strain (CARDIA)    Difficulty of Paying Living Expenses:  Not hard at all  Food Insecurity: No Food Insecurity (06/16/2020)   Hunger Vital Sign    Worried About Running Out of Food in the Last Year: Never true    Ran Out of Food in the Last Year: Never true  Transportation Needs: No Transportation Needs (06/16/2020)   PRAPARE - Hydrologist (Medical): No    Lack of Transportation (Non-Medical): No  Physical Activity: Insufficiently Active (06/16/2020)   Exercise Vital Sign    Days of Exercise per Week: 7 days    Minutes of Exercise per Session: 20 min  Stress: No Stress Concern Present (06/16/2020)   [place]    Feeling of  Stress : Not at all  Social Connections: Moderately Integrated (06/16/2020)   Social Connection and Isolation Panel [NHANES]    Frequency of Communication with Friends and Family: More than three times a week    Frequency of Social Gatherings with Friends and Family: Twice a week    Attends Religious Services: More than 4 times per year    Active Member of [employer] or Organizations: No    Attends Archivist Meetings: Never    Marital Status: Married  Human resources officer Violence: Not At Risk (06/16/2020)   Humiliation, Afraid, Rape, and Kick questionnaire    Fear of Current or Ex-Partner: No    Emotionally Abused: No    Physically Abused: No    Sexually Abused: No    FAMILY HISTORY:  Family History  Problem Relation Age of Onset   COPD Father    Throat cancer Father    Cancer Brother     CURRENT MEDICATIONS:  Current Outpatient Medications  Medication Sig Dispense Refill   acetaminophen (TYLENOL) 650 MG CR tablet Take 650 mg by mouth every 8 (eight) hours as needed (Arthritis).     albuterol (PROVENTIL HFA;VENTOLIN HFA) 108 (90 Base) MCG/ACT inhaler Inhale 2 puffs into the lungs every 6 (six) hours as needed for wheezing or shortness of breath.      allopurinol (ZYLOPRIM) 300 MG tablet TAKE ONE TABLET BY MOUTH ONCE DAILY 30 tablet 3   amLODipine (NORVASC) 10 MG tablet Take 10 mg by mouth daily.     b complex vitamins capsule Take 1 capsule by mouth daily. Folic acid     Blood Glucose Monitoring Suppl (ONETOUCH VERIO FLEX SYSTEM) w/Device KIT daily.     calcitRIOL (ROCALTROL) 0.25 MCG capsule Take by mouth.     glucosamine-chondroitin 500-400 MG tablet Take 1 tablet by mouth daily.     hydrochlorothiazide (HYDRODIURIL) 25 MG tablet Take 25 mg by mouth daily.     Lancets (ONETOUCH DELICA PLUS [id number]) [place] See admin instructions.     MEGARED OMEGA-3 KRILL OIL PO Take by mouth daily.     metoprolol succinate (TOPROL-XL) 100 MG 24 hr tablet Take 100 mg by mouth daily.   1    Multiple Vitamins-Minerals (MULTIVITAMIN WITH MINERALS) tablet Take 1 tablet by mouth daily.     ONETOUCH VERIO test strip 1 each daily.     rosuvastatin (CRESTOR) 5 MG tablet Take 5 mg by mouth at bedtime.     telmisartan (MICARDIS) 20 MG tablet Take by mouth.     vitamin C (ASCORBIC ACID) 500 MG tablet Take 1,000 mg by mouth daily.     zanubrutinib (BRUKINSA) 80 MG capsule Take 2 tablets (160 mg) by mouth 2 (two) times daily. 120 capsule 1   FARXIGA  10 MG TABS tablet Take 1 tablet (10 mg total) by mouth daily. 30 tablet 0   No current facility-administered medications for this visit.    ALLERGIES:  No Known Allergies  PHYSICAL EXAM:  Performance status (ECOG): 1 - Symptomatic but completely ambulatory  Vitals:   08/04/22 1109  BP: 111/69  Pulse: 82  Resp: 16  Temp: 98.3 F (36.8 C)  SpO2: 97%    Wt Readings from Last 3 Encounters:  08/04/22 182 lb 9.6 oz (82.8 kg)  03/17/22 181 lb 14.1 oz (82.5 kg)  02/16/22 184 lb 9.6 oz (83.7 kg)   Physical Exam Vitals reviewed.  Constitutional:      Appearance: Normal appearance.  Cardiovascular:     Rate and Rhythm: Normal rate and regular rhythm.     Pulses: Normal pulses.     Heart sounds: Normal heart sounds.  Pulmonary:     Effort: Pulmonary effort is normal.     Breath sounds: Normal breath sounds.  Musculoskeletal:     Right lower leg: No edema.     Left lower leg: No edema.  Neurological:     General: No focal deficit present.     Mental Status: He is alert and oriented to person, place, and time.  Psychiatric:        Mood and Affect: Mood normal.        Behavior: Behavior normal.      LABORATORY DATA:  I have reviewed the labs as listed.     Latest Ref Rng & Units 08/02/2022    8:45 AM 05/25/2022   10:09 AM 03/17/2022    8:33 AM  CBC  WBC 4.0 - 10.5 K/uL 8.4  8.5  8.1   Hemoglobin 13.0 - 17.0 g/dL 12.2  12.5  12.6   Hematocrit 39.0 - 52.0 % 36.8  37.5  36.7   Platelets 150 - 400 K/uL 173  196  224        Latest Ref Rng & Units 08/02/2022    8:45 AM 05/25/2022   10:09 AM 03/17/2022    8:33 AM  CMP  Glucose 70 - 99 mg/dL 152  151  133   BUN 8 - 23 mg/dL 52  49  36   Creatinine 0.61 - 1.24 mg/dL 2.45  2.34  1.95   Sodium 135 - 145 mmol/L 135  139  143   Potassium 3.5 - 5.1 mmol/L 4.3  5.0  4.4   Chloride 98 - 111 mmol/L 104  108  111   CO2 22 - 32 mmol/L _0 Calcium 8.9 - 10.3 mg/dL 8.1  8.7  9.0   Total Protein 6.5 - 8.1 g/dL 6.9  6.7  7.6   Total Bilirubin 0.3 - 1.2 mg/dL 1.2  0.7  0.6   Alkaline Phos 38 - 126 U/L 58  54  62   AST 15 - 41 U/L _1 ALT 0 - 44 U/L _2 DIAGNOSTIC IMAGING:  I have independently reviewed the scans and discussed with the patient. No results found.   ASSESSMENT:  1.  Stage III small lymphocytic lymphoma: - Recent CT for hematuria work-up on 04/05/2018 showed several retroperitoneal lymph nodes measuring 1.2 cm and pelvic lymph nodes bilaterally, largest in the left obturator region measuring 2.2 cm. -Patient does not have any B symptoms including fevers, night sweats or weight loss. - CT scan from 2010  done at [facility] also showed lymphadenopathy and splenomegaly.  - PET/CT scan on 05/28/2018 showed very low metabolic activity associated with enlarged pelvic and retroperitoneal and axillary nodes.  Spleen and bone marrow are normal.  We discussed the normal progression of low-grade lymphomas in detail. - Ultrasound of the abdomen from 05/30/2020 from [facility] showed normal liver.  Splenomegaly measuring 13.4 x 7.8 x 15.4 cm. - he has history of lymphadenopathy in the retroperitoneal region dating back to 2010.  He does not have any B symptoms or cytopenias. - His renal function has been worsening lately.  Renal ultrasound on 08/09/2021 showed bilateral cortical irregularity consistent with scarring with normal renal echogenicity.  Mild left hydronephrosis. - CT renal study on 09/08/2021: Splenomegaly.  New  para-aortic lymphadenopathy.  Hazy retroperitoneum surrounding the kidneys and aortic adenopathy.  Lymph node left of the aorta at the level of the left kidney measures 15 mm enlarged from prior PET scan.  Multiple periaortic lymph nodes similar in size.  Adenopathy extends along the iliac chains with bulky external iliac nodes. - LDH level is normal.  The creatinine is 1.73. - PET scan (10/18/2021): Mildly metabolic adenopathy above and below the diaphragm with mild splenomegaly, worsened since PET scan from 2019.  SUV of the lymph nodes is less than 5.  Left periaortic lymph node at the level of the left kidney measures 1.8 cm in short axis with SUV 3.2, previously measuring 6 mm on PET scan from 2019.  Left inguinal lymph node measures 2.1 cm with SUV 3.8.  Hazy retroperitoneal and mesenteric stranding with low-level associated metabolic activity in the left infrarenal retroperitoneum with SUV 3.1. - There is a question of whether this retroperitoneal/mesenteric stranding is contributing to his hydronephrosis causing worsening renal function. - Left inguinal lymph node biopsy (11/01/2021): Small lymphocytic lymphoma - CLL FISH panel: Trisomy 12 (neutral prognosis), deletion 13 q. 14 - Indication for treatment: Threatened endorgan function - Zanubrutinib 160 mg twice daily started on 11/18/2021. - PET scan (02/24/2022): Good response to treatment with significant interval decrease in size of lymphadenopathy in the neck, chest, abdomen and pelvis and no significant residual hypermetabolic some ([place] 1).  Stable mild splenomegaly.   PLAN:  1.  Stage III small lymphocytic lymphoma: - PET scan on 02/24/2022: Overall improvement with decrease in size of lymphadenopathy and improved by hypermetabolism.  Stable mild splenomegaly. - Reviewed labs today which shows normal LFTs.  CBC was grossly normal.  LDH is normal. - Continue zanubrutinib 160 mg twice daily. - RTC 2 months for follow-up. - Plan to repeat  PET scan around June.   2.  Mild normocytic anemia: - Combination anemia from CKD and relative iron deficiency.  Ferritin is 72 and percent saturation 28.  Hemoglobin 12.2.  Parenteral iron therapy not indicated.  3.  CKD: - Creatinine is 2.45 today.  It is up from 2.34 on 05/25/2022. Farxiga dose was increased to 10 mg daily on 06/30/2022 by Dr. Manuella Ghazi.  Both Zanu and Wilder Glade can cause elevated creatinine.  He is following with Dr. Theador Hawthorne next week with 24-hour urine for protein.  4.  Gout prophylaxis: - Continue allopurinol 100 mg daily.  5.  Hypertension: - He is on telmisartan, Toprol-XL, HCTZ and Norvasc.  Blood pressure today is well-controlled.   Orders placed this encounter:  Orders Placed This Encounter  Procedures   CBC with Differential   Comprehensive metabolic panel   Lactate dehydrogenase   Iron and TIBC ([facility]  ONLY)   Ferritin      Derek Jack, MD [place] [phone]

## 2022-08-04 NOTE — Patient Instructions (Addendum)
[place] at [facility] Discharge Instructions   You were seen and examined today by Dr. Delton Coombes.  He reviewed the results of your lab work which are normal/stable.   Continue Brukinsa as prescribed.   We will see you back in 2 months. We will repeat lab work prior to this visit.   Thank you for choosing [place] at [facility] to provide your oncology and hematology care.  To afford each patient quality time with our provider, please arrive at least 15 minutes before your scheduled appointment time.   If you have a lab appointment with the [facility] please come in thru the Main Entrance and check in at the main information desk.  You need to re-schedule your appointment should you arrive 10 or more minutes late.  We strive to give you quality time with our providers, and arriving late affects you and other patients whose appointments are after yours.  Also, if you no show three or more times for appointments you may be dismissed from the clinic at the providers discretion.     Again, thank you for choosing [facility].  Our hope is that these requests will decrease the amount of time that you wait before being seen by our physicians.       _____________________________________________________________  Should you have questions after your visit to [facility], please contact our office at [phone] and follow the prompts.  Our office hours are 8:00 a.m. and 4:30 p.m. Monday - Friday.  Please note that voicemails left after 4:00 p.m. may not be returned until the following business day.  We are closed weekends and major holidays.  You do have access to a nurse 24-7, just call the main number to the clinic [phone] and do not press any options, hold on the line and a nurse will answer the phone.    For prescription refill requests, have your pharmacy contact our office and allow 72 hours.    Due to  Covid, you will need to wear a mask upon entering the hospital. If you do not have a mask, a mask will be given to you at the Main Entrance upon arrival. For doctor visits, patients may have 1 support person age 64 or older with them. For treatment visits, patients can not have anyone with them due to social distancing guidelines and our immunocompromised population.

## 2022-08-04 NOTE — Progress Notes (Signed)
Patient is taking Brukinsa as prescribed.  He has not missed any doses and reports no side effects at this time.

## 2022-08-05 ENCOUNTER — Other Ambulatory Visit (HOSPITAL_COMMUNITY): Payer: Self-pay

## 2022-08-05 ENCOUNTER — Telehealth: Payer: Self-pay | Admitting: Pharmacy Technician

## 2022-08-05 ENCOUNTER — Other Ambulatory Visit: Payer: Self-pay

## 2022-08-05 NOTE — Telephone Encounter (Signed)
Oral Oncology Patient Advocate Encounter   Was successful in securing patient an additional $3,250 grant from Patient [facility]) to provide copayment coverage for Brukinsa.  This will keep the out of pocket expense at $0.    The billing information is as follows and has been shared with [place].   Member ID: [id number] Group ID: [id number] RxBin: [id number] Dates of Eligibility: 08/14/21 through 11/12/22  Fund:  [place], CPhT-Adv Oncology Pharmacy Patient [place] Direct Number: [phone]  Fax: [phone]

## 2022-08-10 ENCOUNTER — Telehealth: Payer: Self-pay

## 2022-08-10 ENCOUNTER — Other Ambulatory Visit (HOSPITAL_COMMUNITY): Payer: Self-pay

## 2022-08-10 DIAGNOSIS — N1832 Chronic kidney disease, stage 3b: Secondary | ICD-10-CM | POA: Diagnosis not present

## 2022-08-10 DIAGNOSIS — R808 Other proteinuria: Secondary | ICD-10-CM | POA: Diagnosis not present

## 2022-08-10 DIAGNOSIS — D472 Monoclonal gammopathy: Secondary | ICD-10-CM | POA: Diagnosis not present

## 2022-08-10 DIAGNOSIS — E211 Secondary hyperparathyroidism, not elsewhere classified: Secondary | ICD-10-CM | POA: Diagnosis not present

## 2022-08-10 NOTE — Telephone Encounter (Signed)
Oral Oncology Patient Advocate Encounter  Was successful in securing patient a $8,000 grant from [employer] to provide copayment coverage for Brukinsa.  This will keep the out of pocket expense at $0.     [employer] ID: [id number]  I have spoken with the patient.   The billing information is as follows and has been shared with [place].    RxBin: [id number] PCN: [id number] Member ID: [id number] Group ID: [id number] Dates of Eligibility: 07/11/22 through 07/11/23  Fund:  Chronic Lymphocytic [place], [facility] Patient [place]  [phone] (phone) [phone] (fax) 08/10/2022 3:21 PM

## 2022-08-12 DIAGNOSIS — E1165 Type 2 diabetes mellitus with hyperglycemia: Secondary | ICD-10-CM | POA: Diagnosis not present

## 2022-08-12 DIAGNOSIS — E1122 Type 2 diabetes mellitus with diabetic chronic kidney disease: Secondary | ICD-10-CM | POA: Diagnosis not present

## 2022-08-12 DIAGNOSIS — J069 Acute upper respiratory infection, unspecified: Secondary | ICD-10-CM | POA: Diagnosis not present

## 2022-08-12 DIAGNOSIS — N183 Chronic kidney disease, stage 3 unspecified: Secondary | ICD-10-CM | POA: Diagnosis not present

## 2022-08-12 DIAGNOSIS — Z299 Encounter for prophylactic measures, unspecified: Secondary | ICD-10-CM | POA: Diagnosis not present

## 2022-08-22 DIAGNOSIS — R808 Other proteinuria: Secondary | ICD-10-CM | POA: Diagnosis not present

## 2022-08-22 DIAGNOSIS — D638 Anemia in other chronic diseases classified elsewhere: Secondary | ICD-10-CM | POA: Diagnosis not present

## 2022-08-22 DIAGNOSIS — D472 Monoclonal gammopathy: Secondary | ICD-10-CM | POA: Diagnosis not present

## 2022-08-22 DIAGNOSIS — N1832 Chronic kidney disease, stage 3b: Secondary | ICD-10-CM | POA: Diagnosis not present

## 2022-08-22 DIAGNOSIS — E211 Secondary hyperparathyroidism, not elsewhere classified: Secondary | ICD-10-CM | POA: Diagnosis not present

## 2022-08-30 ENCOUNTER — Other Ambulatory Visit (HOSPITAL_COMMUNITY): Payer: Self-pay

## 2022-08-30 ENCOUNTER — Other Ambulatory Visit: Payer: Self-pay | Admitting: Hematology

## 2022-08-30 DIAGNOSIS — C8300 Small cell B-cell lymphoma, unspecified site: Secondary | ICD-10-CM

## 2022-08-30 MED ORDER — BRUKINSA 80 MG PO CAPS
160.0000 mg | ORAL_CAPSULE | Freq: Two times a day (BID) | ORAL | 1 refills | Status: DC
  Filled 2022-08-30: qty 120, 30d supply, fill #0
  Filled 2022-09-28: qty 120, 30d supply, fill #1

## 2022-09-06 ENCOUNTER — Other Ambulatory Visit: Payer: Self-pay

## 2022-09-27 DIAGNOSIS — E211 Secondary hyperparathyroidism, not elsewhere classified: Secondary | ICD-10-CM | POA: Diagnosis not present

## 2022-09-27 DIAGNOSIS — R808 Other proteinuria: Secondary | ICD-10-CM | POA: Diagnosis not present

## 2022-09-27 DIAGNOSIS — D472 Monoclonal gammopathy: Secondary | ICD-10-CM | POA: Diagnosis not present

## 2022-09-27 DIAGNOSIS — N1832 Chronic kidney disease, stage 3b: Secondary | ICD-10-CM | POA: Diagnosis not present

## 2022-09-27 DIAGNOSIS — D638 Anemia in other chronic diseases classified elsewhere: Secondary | ICD-10-CM | POA: Diagnosis not present

## 2022-09-28 ENCOUNTER — Other Ambulatory Visit (HOSPITAL_COMMUNITY): Payer: Self-pay

## 2022-09-28 DIAGNOSIS — E119 Type 2 diabetes mellitus without complications: Secondary | ICD-10-CM | POA: Diagnosis not present

## 2022-10-03 ENCOUNTER — Inpatient Hospital Stay: Payer: HMO | Attending: Hematology

## 2022-10-03 DIAGNOSIS — C8300 Small cell B-cell lymphoma, unspecified site: Secondary | ICD-10-CM

## 2022-10-03 DIAGNOSIS — Z79899 Other long term (current) drug therapy: Secondary | ICD-10-CM | POA: Diagnosis not present

## 2022-10-03 DIAGNOSIS — D631 Anemia in chronic kidney disease: Secondary | ICD-10-CM | POA: Insufficient documentation

## 2022-10-03 DIAGNOSIS — I129 Hypertensive chronic kidney disease with stage 1 through stage 4 chronic kidney disease, or unspecified chronic kidney disease: Secondary | ICD-10-CM | POA: Diagnosis not present

## 2022-10-03 DIAGNOSIS — N189 Chronic kidney disease, unspecified: Secondary | ICD-10-CM | POA: Insufficient documentation

## 2022-10-03 DIAGNOSIS — C8305 Small cell B-cell lymphoma, lymph nodes of inguinal region and lower limb: Secondary | ICD-10-CM | POA: Insufficient documentation

## 2022-10-03 LAB — CBC WITH DIFFERENTIAL/PLATELET
Abs Immature Granulocytes: 0.03 10*3/uL (ref 0.00–0.07)
Basophils Absolute: 0.1 10*3/uL (ref 0.0–0.1)
Basophils Relative: 1 %
Eosinophils Absolute: 0.3 10*3/uL (ref 0.0–0.5)
Eosinophils Relative: 5 %
HCT: 35.3 % — ABNORMAL LOW (ref 39.0–52.0)
Hemoglobin: 12 g/dL — ABNORMAL LOW (ref 13.0–17.0)
Immature Granulocytes: 1 %
Lymphocytes Relative: 32 %
Lymphs Abs: 1.9 10*3/uL (ref 0.7–4.0)
MCH: 30.8 pg (ref 26.0–34.0)
MCHC: 34 g/dL (ref 30.0–36.0)
MCV: 90.7 fL (ref 80.0–100.0)
Monocytes Absolute: 0.4 10*3/uL (ref 0.1–1.0)
Monocytes Relative: 7 %
Neutro Abs: 3.2 10*3/uL (ref 1.7–7.7)
Neutrophils Relative %: 54 %
Platelets: 223 10*3/uL (ref 150–400)
RBC: 3.89 MIL/uL — ABNORMAL LOW (ref 4.22–5.81)
RDW: 13 % (ref 11.5–15.5)
WBC: 5.8 10*3/uL (ref 4.0–10.5)
nRBC: 0 % (ref 0.0–0.2)

## 2022-10-03 LAB — FERRITIN: Ferritin: 77 ng/mL (ref 24–336)

## 2022-10-03 LAB — COMPREHENSIVE METABOLIC PANEL
ALT: 17 U/L (ref 0–44)
AST: 15 U/L (ref 15–41)
Albumin: 3.3 g/dL — ABNORMAL LOW (ref 3.5–5.0)
Alkaline Phosphatase: 62 U/L (ref 38–126)
Anion gap: 8 (ref 5–15)
BUN: 40 mg/dL — ABNORMAL HIGH (ref 8–23)
CO2: 26 mmol/L (ref 22–32)
Calcium: 8.6 mg/dL — ABNORMAL LOW (ref 8.9–10.3)
Chloride: 104 mmol/L (ref 98–111)
Creatinine, Ser: 2.11 mg/dL — ABNORMAL HIGH (ref 0.61–1.24)
GFR, Estimated: 32 mL/min — ABNORMAL LOW (ref >60)
Glucose, Bld: 166 mg/dL — ABNORMAL HIGH (ref 70–99)
Potassium: 4.9 mmol/L (ref 3.5–5.1)
Sodium: 138 mmol/L (ref 135–145)
Total Bilirubin: 0.9 mg/dL (ref 0.3–1.2)
Total Protein: 7.1 g/dL (ref 6.5–8.1)

## 2022-10-03 LAB — IRON AND TIBC
Iron: 64 ug/dL (ref 45–182)
Saturation Ratios: 22 % (ref 17.9–39.5)
TIBC: 289 ug/dL (ref 250–450)
UIBC: 225 ug/dL

## 2022-10-03 LAB — LACTATE DEHYDROGENASE: LDH: 130 U/L (ref 98–192)

## 2022-10-04 ENCOUNTER — Other Ambulatory Visit: Payer: Self-pay | Admitting: Cardiology

## 2022-10-04 DIAGNOSIS — N1832 Chronic kidney disease, stage 3b: Secondary | ICD-10-CM | POA: Diagnosis not present

## 2022-10-04 DIAGNOSIS — C8300 Small cell B-cell lymphoma, unspecified site: Secondary | ICD-10-CM | POA: Diagnosis not present

## 2022-10-04 DIAGNOSIS — R808 Other proteinuria: Secondary | ICD-10-CM | POA: Diagnosis not present

## 2022-10-04 DIAGNOSIS — D638 Anemia in other chronic diseases classified elsewhere: Secondary | ICD-10-CM | POA: Diagnosis not present

## 2022-10-04 DIAGNOSIS — E211 Secondary hyperparathyroidism, not elsewhere classified: Secondary | ICD-10-CM | POA: Diagnosis not present

## 2022-10-04 DIAGNOSIS — D472 Monoclonal gammopathy: Secondary | ICD-10-CM | POA: Diagnosis not present

## 2022-10-05 ENCOUNTER — Other Ambulatory Visit (HOSPITAL_COMMUNITY): Payer: Self-pay

## 2022-10-09 NOTE — Patient Instructions (Addendum)
[place] at [facility] Discharge Instructions   You were seen and examined today by Dr. Delton Coombes.  He reviewed the results of your lab work which are mostly normal/stable. We checked your iron levels with your last lab work. Your ferritin (iron stores) are normal at 77. However, your saturation ratio of iron is 22%. Because of your kidney disease, your body requires more iron to make red blood cells. Your hemoglobin is 12.0, which is one point below the low end of normal. You will benefit from taking iron.   Return as scheduled.    Thank you for choosing [place] at [facility] to provide your oncology and hematology care.  To afford each patient quality time with our provider, please arrive at least 15 minutes before your scheduled appointment time.   If you have a lab appointment with the [facility] please come in thru the Main Entrance and check in at the main information desk.  You need to re-schedule your appointment should you arrive 10 or more minutes late.  We strive to give you quality time with our providers, and arriving late affects you and other patients whose appointments are after yours.  Also, if you no show three or more times for appointments you may be dismissed from the clinic at the providers discretion.     Again, thank you for choosing [facility].  Our hope is that these requests will decrease the amount of time that you wait before being seen by our physicians.       _____________________________________________________________  Should you have questions after your visit to [facility], please contact our office at [phone] and follow the prompts.  Our office hours are 8:00 a.m. and 4:30 p.m. Monday - Friday.  Please note that voicemails left after 4:00 p.m. may not be returned until the following business day.  We are closed weekends and major holidays.  You do have access to a nurse  24-7, just call the main number to the clinic [phone] and do not press any options, hold on the line and a nurse will answer the phone.    For prescription refill requests, have your pharmacy contact our office and allow 72 hours.    Due to Covid, you will need to wear a mask upon entering the hospital. If you do not have a mask, a mask will be given to you at the Main Entrance upon arrival. For doctor visits, patients may have 1 support person age 35 or older with them. For treatment visits, patients can not have anyone with them due to social distancing guidelines and our immunocompromised population.

## 2022-10-10 ENCOUNTER — Inpatient Hospital Stay (HOSPITAL_BASED_OUTPATIENT_CLINIC_OR_DEPARTMENT_OTHER): Payer: HMO | Admitting: Hematology

## 2022-10-10 VITALS — BP 151/76 | HR 62 | Temp 97.8°F | Resp 18 | Ht 68.5 in | Wt 185.0 lb

## 2022-10-10 DIAGNOSIS — C8300 Small cell B-cell lymphoma, unspecified site: Secondary | ICD-10-CM

## 2022-10-10 DIAGNOSIS — C8305 Small cell B-cell lymphoma, lymph nodes of inguinal region and lower limb: Secondary | ICD-10-CM | POA: Diagnosis not present

## 2022-10-10 NOTE — Progress Notes (Signed)
Patient is taking Brukinsa as prescribed.  He has not missed any doses and reports no side effects at this time.   

## 2022-10-10 NOTE — Progress Notes (Signed)
Kevin Hooper [address] [place] [address]    Clinic Day:  10/10/2022  Referring physician: Monico Blitz, MD  Patient Care Team: Monico Blitz, MD as PCP - General (Internal Medicine)   ASSESSMENT & PLAN:   Assessment: 1.  Stage III small lymphocytic lymphoma: - Recent CT for hematuria work-up on 04/05/2018 showed several retroperitoneal lymph nodes measuring 1.2 cm and pelvic lymph nodes bilaterally, largest in the left obturator region measuring 2.2 cm. -Patient does not have any B symptoms including fevers, night sweats or weight loss. - CT scan from 2010 done at [facility] also showed lymphadenopathy and splenomegaly.  - PET/CT scan on 05/28/2018 showed very low metabolic activity associated with enlarged pelvic and retroperitoneal and axillary nodes.  Spleen and bone marrow are normal.  We discussed the normal progression of low-grade lymphomas in detail. - Ultrasound of the abdomen from 05/30/2020 from [facility] showed normal liver.  Splenomegaly measuring 13.4 x 7.8 x 15.4 cm. - he has history of lymphadenopathy in the retroperitoneal region dating back to 2010.  He does not have any B symptoms or cytopenias. - His renal function has been worsening lately.  Renal ultrasound on 08/09/2021 showed bilateral cortical irregularity consistent with scarring with normal renal echogenicity.  Mild left hydronephrosis. - CT renal study on 09/08/2021: Splenomegaly.  New para-aortic lymphadenopathy.  Hazy retroperitoneum surrounding the kidneys and aortic adenopathy.  Lymph node left of the aorta at the level of the left kidney measures 15 mm enlarged from prior PET scan.  Multiple periaortic lymph nodes similar in size.  Adenopathy extends along the iliac chains with bulky external iliac nodes. - LDH level is normal.  The creatinine is 1.73. - PET scan (10/18/2021): Mildly metabolic adenopathy above and below the diaphragm with mild splenomegaly, worsened  since PET scan from 2019.  SUV of the lymph nodes is less than 5.  Left periaortic lymph node at the level of the left kidney measures 1.8 cm in short axis with SUV 3.2, previously measuring 6 mm on PET scan from 2019.  Left inguinal lymph node measures 2.1 cm with SUV 3.8.  Hazy retroperitoneal and mesenteric stranding with low-level associated metabolic activity in the left infrarenal retroperitoneum with SUV 3.1. - There is a question of whether this retroperitoneal/mesenteric stranding is contributing to his hydronephrosis causing worsening renal function. - Left inguinal lymph node biopsy (11/01/2021): Small lymphocytic lymphoma - CLL FISH panel: Trisomy 12 (neutral prognosis), deletion 13 q. 14 - Indication for treatment: Threatened endorgan function - Zanubrutinib 160 mg twice daily started on 11/18/2021. - PET scan (02/24/2022): Good response to treatment with significant interval decrease in size of lymphadenopathy in the neck, chest, abdomen and pelvis and no significant residual hypermetabolic some ([place] 1).  Stable mild splenomegaly.  Plan: 1.  Stage III small lymphocytic lymphoma: - PET scan (02/24/2022): Overall improvement with decrease in size of lymphadenopathy and improved hypermetabolism.  Stable mild splenomegaly. - He is tolerating zanubrutinib reasonably well. - Physical exam: No adenopathy in the neck, axillary or inguinal region. - Labs from 10/03/2022 reviewed by me showed normal LFTs.  Creatinine slightly improved to 2.11.  CBC was grossly normal.  LDH is normal. - Recommend continuing Zanubrutinib 160 mg twice daily. - RTC 3 months for follow-up.  Will plan on repeating PET CT scan prior to next visit.   2.  Mild normocytic anemia: - Combination anemia from CKD and relative iron deficiency. - Hemoglobin is 12.0 and  stable.  Ferritin is 77 and percent saturation 22. - Continue iron tablet daily.  No indication for IV iron.  3.  CKD: - Farxiga 10 mg dose reduced to  Monday, Wednesday and Friday. - Creatinine has improved to 2.11 from 2.45 at last visit.  4.  Gout prophylaxis: - Continue allopurinol 100 mg daily.   5.  Hypertension: - Continue telmisartan, Toprol-XL, HCTZ and Norvasc.  Blood pressure is well-controlled.  Orders Placed This Encounter  Procedures   NM PET Image Restag (PS) Skull Base To Thigh    Standing Status:   Future    Standing Expiration Date:   10/10/2023    Order Specific Question:   If indicated for the ordered procedure, I authorize the administration of a radiopharmaceutical per Radiology protocol    Answer:   Yes    Order Specific Question:   Preferred imaging location?    Answer:   [facility]   CBC with Differential    Standing Status:   Future    Standing Expiration Date:   10/10/2023   Comprehensive metabolic panel    Standing Status:   Future    Standing Expiration Date:   10/10/2023   Lactate dehydrogenase    Standing Status:   Future    Standing Expiration Date:   10/10/2023   Iron and TIBC ([facility] ONLY)    Standing Status:   Future    Standing Expiration Date:   10/10/2023   Ferritin    Standing Status:   Future    Standing Expiration Date:   10/10/2023   Uric acid    Standing Status:   Future    Standing Expiration Date:   10/10/2023    Order Specific Question:   Release to patient    Answer:   Immediate      I,Alexis Herring,acting as a scribe for Derek Jack, MD.,have documented all relevant documentation on the behalf of Derek Jack, MD,as directed by  Derek Jack, MD while in the presence of Derek Jack, MD.   I, Derek Jack MD, have reviewed the above documentation for accuracy and completeness, and I agree with the above.   Derek Jack, MD   3/11/20244:52 PM  CHIEF COMPLAINT:   Diagnosis: stage III small lymphocytic lymphoma    Cancer Staging  Small lymphocytic lymphoma ([place]) Staging form: Hodgkin and Non-Hodgkin  Lymphoma, AJCC 8th Edition - Clinical stage from 11/10/2021: Stage III (Small lymphocytic leukemia) - Unsigned    Prior Therapy: none  Current Therapy:  Zanubrutinib 160 mg twice daily   HISTORY OF PRESENT ILLNESS:   Oncology History   No history exists.     INTERVAL HISTORY:   Kevin Hooper is a 77 y.o. male presenting to clinic today for follow up of stage III small lymphocytic lymphoma. He was last seen by me on 08/04/22.  Today, he states that he is doing well overall. His appetite level is at 100%. His energy level is at 80%. He denies any bleeding.  He has easy bruising.  Denies any infections since last visit.  Numbness in the fingers has been stable.  PAST MEDICAL HISTORY:   Past Medical History: Past Medical History:  Diagnosis Date   Arthritis    Cataracts, bilateral 11/2016   Chronic kidney disease    sTAGE 3   Diabetes mellitus without complication ([place])    Gout    Hyperlipidemia    Hypertension    Joint ache    Prostate atrophy 2018    Surgical  History: Past Surgical History:  Procedure Laterality Date   COLON RESECTION  2015   EXCISION OF SKIN TAG Left 11/01/2021   Procedure: EXCISION OF SKIN TAG;  Surgeon: Virl Cagey, MD;  Location: [facility];  Service: General;  Laterality: Left;   HERNIA REPAIR     UMBILICAL   INGUINAL LYMPH NODE BIOPSY Left 11/01/2021   Procedure: INGUINAL LYMPH NODE BIOPSY- LEFT;  Surgeon: Virl Cagey, MD;  Location: [facility];  Service: General;  Laterality: Left;   LUMBAR FUSION  1990   OTHER SURGICAL HISTORY Left    Arm s/p accident   REPLACEMENT TOTAL KNEE BILATERAL Bilateral 1990   TRANSURETHRAL RESECTION OF PROSTATE N/A 07/04/2017   Procedure: TRANSURETHRAL RESECTION OF THE PROSTATE (TURP);  Surgeon: Irine Seal, MD;  Location: [facility];  Service: Urology;  Laterality: N/A;    Social History: Social History   Socioeconomic History   Marital status: Married    Spouse name: Not on file   Number of children: 2   Years of  education: 12+   Highest education level: Not on file  Occupational History   Occupation: Retired  Tobacco Use   Smoking status: Former    Packs/day: 2.00    Years: 25.00    Total pack years: 50.00    Types: Cigarettes    Quit date: 04/28/1992    Years since quitting: 30.4   Smokeless tobacco: Never  Vaping Use   Vaping Use: Never used  Substance and Sexual Activity   Alcohol use: Yes    Comment: Occasional   Drug use: No   Sexual activity: Not on file    Comment: Married  Other Topics Concern   Not on file  Social History Narrative   Lives at home w/ his wife   Right-handed   Caffeine: occasional tea   Social Determinants of Health   Financial Resource Strain: Low Risk  (06/16/2020)   Overall Financial Resource Strain (CARDIA)    Difficulty of Paying Living Expenses: Not hard at all  Food Insecurity: No Food Insecurity (06/16/2020)   Hunger Vital Sign    Worried About Running Out of Food in the Last Year: Never true    [place] in the Last Year: Never true  Transportation Needs: No Transportation Needs (06/16/2020)   PRAPARE - Hydrologist (Medical): No    Lack of Transportation (Non-Medical): No  Physical Activity: Insufficiently Active (06/16/2020)   Exercise Vital Sign    Days of Exercise per Week: 7 days    Minutes of Exercise per Session: 20 min  Stress: No Stress Concern Present (06/16/2020)   [place]    Feeling of Stress : Not at all  Social Connections: Moderately Integrated (06/16/2020)   Social Connection and Isolation Panel [NHANES]    Frequency of Communication with Friends and Family: More than three times a week    Frequency of Social Gatherings with Friends and Family: Twice a week    Attends Religious Services: More than 4 times per year    Active Member of [employer] or Organizations: No    Attends Archivist Meetings: Never    Marital  Status: Married  Human resources officer Violence: Not At Risk (06/16/2020)   Humiliation, Afraid, Rape, and Kick questionnaire    Fear of Current or Ex-Partner: No    Emotionally Abused: No    Physically Abused: No    Sexually Abused: No  Family History: Family History  Problem Relation Age of Onset   COPD Father    Throat cancer Father    Cancer Brother     Current Medications:  Current Outpatient Medications:    acetaminophen (TYLENOL) 650 MG CR tablet, Take 650 mg by mouth every 8 (eight) hours as needed (Arthritis)., Disp: , Rfl:    albuterol (PROVENTIL HFA;VENTOLIN HFA) 108 (90 Base) MCG/ACT inhaler, Inhale 2 puffs into the lungs every 6 (six) hours as needed for wheezing or shortness of breath. , Disp: , Rfl:    allopurinol (ZYLOPRIM) 300 MG tablet, TAKE ONE TABLET BY MOUTH ONCE DAILY, Disp: 30 tablet, Rfl: 3   amLODipine (NORVASC) 10 MG tablet, Take 10 mg by mouth daily., Disp: , Rfl:    b complex vitamins capsule, Take 1 capsule by mouth daily. Folic acid, Disp: , Rfl:    Blood Glucose Monitoring Suppl (ONETOUCH VERIO FLEX SYSTEM) w/Device KIT, daily., Disp: , Rfl:    calcitRIOL (ROCALTROL) 0.25 MCG capsule, Take 0.25 mcg by mouth as directed. M-W-F, Disp: , Rfl:    FARXIGA 10 MG TABS tablet, Take 1 tablet (10 mg total) by mouth daily. (Patient taking differently: Take 10 mg by mouth as directed. M-W-F), Disp: 30 tablet, Rfl: 0   glucosamine-chondroitin 500-400 MG tablet, Take 1 tablet by mouth daily., Disp: , Rfl:    hydrochlorothiazide (HYDRODIURIL) 25 MG tablet, Take 25 mg by mouth daily., Disp: , Rfl:    Lancets (ONETOUCH DELICA PLUS [id number]) MISC, See admin instructions., Disp: , Rfl:    MEGARED OMEGA-3 KRILL OIL PO, Take by mouth daily., Disp: , Rfl:    metoprolol succinate (TOPROL-XL) 100 MG 24 hr tablet, Take 100 mg by mouth daily. , Disp: , Rfl: 1   Multiple Vitamins-Minerals (MULTIVITAMIN WITH MINERALS) tablet, Take 1 tablet by mouth daily., Disp: , Rfl:    ONETOUCH  VERIO test strip, 1 each daily., Disp: , Rfl:    rosuvastatin (CRESTOR) 5 MG tablet, Take 5 mg by mouth at bedtime., Disp: , Rfl:    telmisartan (MICARDIS) 20 MG tablet, Take 20 mg by mouth as directed. Tues-Thurs-Sat-Sun, Disp: , Rfl:    vitamin C (ASCORBIC ACID) 500 MG tablet, Take 1,000 mg by mouth daily., Disp: , Rfl:    zanubrutinib (BRUKINSA) 80 MG capsule, Take 2 tablets (160 mg) by mouth 2 (two) times daily., Disp: 120 capsule, Rfl: 1   Allergies: No Known Allergies  REVIEW OF SYSTEMS:   Review of Systems  Constitutional:  Negative for chills, fatigue and fever.  HENT:   Negative for lump/mass, mouth sores, nosebleeds, sore throat and trouble swallowing.   Eyes:  Negative for eye problems.  Respiratory:  Negative for cough and shortness of breath.   Cardiovascular:  Negative for chest pain, leg swelling and palpitations.  Gastrointestinal:  Negative for abdominal pain, constipation, diarrhea, nausea and vomiting.  Genitourinary:  Negative for bladder incontinence, difficulty urinating, dysuria, frequency, hematuria and nocturia.   Musculoskeletal:  Negative for arthralgias, back pain, flank pain, myalgias and neck pain.  Skin:  Negative for itching and rash.  Neurological:  Positive for numbness. Negative for dizziness and headaches.  Hematological:  Bruises/bleeds easily.  Psychiatric/Behavioral:  Negative for depression, sleep disturbance and suicidal ideas. The patient is not nervous/anxious.   All other systems reviewed and are negative.    VITALS:   Blood pressure (!) 151/76, pulse 62, temperature 97.8 F (36.6 C), temperature source Tympanic, resp. rate 18, height 5' 8.5" (1.74 m), weight 185  lb (83.9 kg), SpO2 97 %.  Wt Readings from Last 3 Encounters:  10/10/22 185 lb (83.9 kg)  08/04/22 182 lb 9.6 oz (82.8 kg)  03/17/22 181 lb 14.1 oz (82.5 kg)    Body mass index is 27.72 kg/m.  Performance status (ECOG): 1 - Symptomatic but completely ambulatory  PHYSICAL  EXAM:   Physical Exam Vitals and nursing note reviewed. Exam conducted with a chaperone present.  Constitutional:      Appearance: Normal appearance.  Cardiovascular:     Rate and Rhythm: Normal rate and regular rhythm.     Pulses: Normal pulses.     Heart sounds: Normal heart sounds.  Pulmonary:     Effort: Pulmonary effort is normal.     Breath sounds: Normal breath sounds.  Abdominal:     Palpations: Abdomen is soft. There is no hepatomegaly, splenomegaly or mass.     Tenderness: There is no abdominal tenderness.  Musculoskeletal:     Right lower leg: No edema.     Left lower leg: No edema.  Lymphadenopathy:     Cervical: No cervical adenopathy.     Right cervical: No superficial, deep or posterior cervical adenopathy.    Left cervical: No superficial, deep or posterior cervical adenopathy.     Upper Body:     Right upper body: No supraclavicular or axillary adenopathy.     Left upper body: No supraclavicular or axillary adenopathy.  Neurological:     General: No focal deficit present.     Mental Status: He is alert and oriented to person, place, and time.  Psychiatric:        Mood and Affect: Mood normal.        Behavior: Behavior normal.     LABS:      Latest Ref Rng & Units 10/03/2022   11:34 AM 08/02/2022    8:45 AM 05/25/2022   10:09 AM  CBC  WBC 4.0 - 10.5 K/uL 5.8  8.4  8.5   Hemoglobin 13.0 - 17.0 g/dL 12.0  12.2  12.5   Hematocrit 39.0 - 52.0 % 35.3  36.8  37.5   Platelets 150 - 400 K/uL 223  173  196       Latest Ref Rng & Units 10/03/2022   11:34 AM 08/02/2022    8:45 AM 05/25/2022   10:09 AM  CMP  Glucose 70 - 99 mg/dL 166  152  151   BUN 8 - 23 mg/dL 40  52  49   Creatinine 0.61 - 1.24 mg/dL 2.11  2.45  2.34   Sodium 135 - 145 mmol/L 138  135  139   Potassium 3.5 - 5.1 mmol/L 4.9  4.3  5.0   Chloride 98 - 111 mmol/L 104  104  108   CO2 22 - 32 mmol/L '26  24  24   '$ Calcium 8.9 - 10.3 mg/dL 8.6  8.1  8.7   Total Protein 6.5 - 8.1 g/dL 7.1  6.9  6.7    Total Bilirubin 0.3 - 1.2 mg/dL 0.9  1.2  0.7   Alkaline Phos 38 - 126 U/L 62  58  54   AST 15 - 41 U/L '15  12  14   '$ ALT 0 - 44 U/L '17  14  18      '$ No results found for: "CEA1", "CEA" / No results found for: "CEA1", "CEA" Lab Results  Component Value Date   PSA1 4.7 (H) 09/15/2021   No results found for: "  [id number]" No results found for: "CAN125"  No results found for: "TOTALPROTELP", "ALBUMINELP", "A1GS", "A2GS", "BETS", "BETA2SER", "GAMS", "MSPIKE", "SPEI" Lab Results  Component Value Date   TIBC 289 10/03/2022   TIBC 284 08/02/2022   TIBC 303 03/17/2022   FERRITIN 77 10/03/2022   FERRITIN 72 08/02/2022   FERRITIN 73 03/17/2022   IRONPCTSAT 22 10/03/2022   IRONPCTSAT 28 08/02/2022   IRONPCTSAT 24 03/17/2022   Lab Results  Component Value Date   LDH 130 10/03/2022   LDH 138 08/02/2022   LDH 125 05/25/2022     STUDIES:   No results found.

## 2022-11-01 ENCOUNTER — Other Ambulatory Visit (HOSPITAL_COMMUNITY): Payer: Self-pay

## 2022-11-01 ENCOUNTER — Other Ambulatory Visit: Payer: Self-pay | Admitting: *Deleted

## 2022-11-01 ENCOUNTER — Other Ambulatory Visit: Payer: Self-pay | Admitting: Hematology

## 2022-11-01 ENCOUNTER — Other Ambulatory Visit: Payer: Self-pay

## 2022-11-01 DIAGNOSIS — C8300 Small cell B-cell lymphoma, unspecified site: Secondary | ICD-10-CM

## 2022-11-01 MED ORDER — BRUKINSA 80 MG PO CAPS
160.0000 mg | ORAL_CAPSULE | Freq: Two times a day (BID) | ORAL | 1 refills | Status: DC
  Filled 2022-11-01: qty 120, 30d supply, fill #0
  Filled 2022-11-24: qty 120, 30d supply, fill #1

## 2022-11-01 NOTE — Telephone Encounter (Signed)
Refill approved for [place] approved.  Patient is tolerating and is to continue therapy.

## 2022-11-02 ENCOUNTER — Other Ambulatory Visit: Payer: Self-pay

## 2022-11-09 DIAGNOSIS — I700 Atherosclerosis of aorta: Secondary | ICD-10-CM | POA: Diagnosis not present

## 2022-11-09 DIAGNOSIS — I10 Essential (primary) hypertension: Secondary | ICD-10-CM | POA: Diagnosis not present

## 2022-11-09 DIAGNOSIS — Z1331 Encounter for screening for depression: Secondary | ICD-10-CM | POA: Diagnosis not present

## 2022-11-09 DIAGNOSIS — Z1339 Encounter for screening examination for other mental health and behavioral disorders: Secondary | ICD-10-CM | POA: Diagnosis not present

## 2022-11-09 DIAGNOSIS — J449 Chronic obstructive pulmonary disease, unspecified: Secondary | ICD-10-CM | POA: Diagnosis not present

## 2022-11-09 DIAGNOSIS — Z299 Encounter for prophylactic measures, unspecified: Secondary | ICD-10-CM | POA: Diagnosis not present

## 2022-11-09 DIAGNOSIS — Z0000 Encounter for general adult medical examination without abnormal findings: Secondary | ICD-10-CM | POA: Diagnosis not present

## 2022-11-09 DIAGNOSIS — Z6829 Body mass index (BMI) 29.0-29.9, adult: Secondary | ICD-10-CM | POA: Diagnosis not present

## 2022-11-09 DIAGNOSIS — Z7189 Other specified counseling: Secondary | ICD-10-CM | POA: Diagnosis not present

## 2022-11-09 DIAGNOSIS — C9110 Chronic lymphocytic leukemia of B-cell type not having achieved remission: Secondary | ICD-10-CM | POA: Diagnosis not present

## 2022-11-24 ENCOUNTER — Other Ambulatory Visit (HOSPITAL_COMMUNITY): Payer: Self-pay

## 2022-11-29 ENCOUNTER — Other Ambulatory Visit (HOSPITAL_COMMUNITY): Payer: Self-pay

## 2022-12-08 DIAGNOSIS — Z79899 Other long term (current) drug therapy: Secondary | ICD-10-CM | POA: Diagnosis not present

## 2022-12-08 DIAGNOSIS — D472 Monoclonal gammopathy: Secondary | ICD-10-CM | POA: Diagnosis not present

## 2022-12-08 DIAGNOSIS — N1832 Chronic kidney disease, stage 3b: Secondary | ICD-10-CM | POA: Diagnosis not present

## 2022-12-08 DIAGNOSIS — E211 Secondary hyperparathyroidism, not elsewhere classified: Secondary | ICD-10-CM | POA: Diagnosis not present

## 2022-12-08 DIAGNOSIS — C8300 Small cell B-cell lymphoma, unspecified site: Secondary | ICD-10-CM | POA: Diagnosis not present

## 2022-12-08 DIAGNOSIS — R808 Other proteinuria: Secondary | ICD-10-CM | POA: Diagnosis not present

## 2022-12-08 DIAGNOSIS — D638 Anemia in other chronic diseases classified elsewhere: Secondary | ICD-10-CM | POA: Diagnosis not present

## 2022-12-12 DIAGNOSIS — C8590 Non-Hodgkin lymphoma, unspecified, unspecified site: Secondary | ICD-10-CM | POA: Diagnosis not present

## 2022-12-12 DIAGNOSIS — E1122 Type 2 diabetes mellitus with diabetic chronic kidney disease: Secondary | ICD-10-CM | POA: Diagnosis not present

## 2022-12-12 DIAGNOSIS — I10 Essential (primary) hypertension: Secondary | ICD-10-CM | POA: Diagnosis not present

## 2022-12-12 DIAGNOSIS — E1165 Type 2 diabetes mellitus with hyperglycemia: Secondary | ICD-10-CM | POA: Diagnosis not present

## 2022-12-12 DIAGNOSIS — Z299 Encounter for prophylactic measures, unspecified: Secondary | ICD-10-CM | POA: Diagnosis not present

## 2022-12-12 DIAGNOSIS — N183 Chronic kidney disease, stage 3 unspecified: Secondary | ICD-10-CM | POA: Diagnosis not present

## 2022-12-15 DIAGNOSIS — D472 Monoclonal gammopathy: Secondary | ICD-10-CM | POA: Diagnosis not present

## 2022-12-15 DIAGNOSIS — C8300 Small cell B-cell lymphoma, unspecified site: Secondary | ICD-10-CM | POA: Diagnosis not present

## 2022-12-15 DIAGNOSIS — I129 Hypertensive chronic kidney disease with stage 1 through stage 4 chronic kidney disease, or unspecified chronic kidney disease: Secondary | ICD-10-CM | POA: Diagnosis not present

## 2022-12-15 DIAGNOSIS — N200 Calculus of kidney: Secondary | ICD-10-CM | POA: Diagnosis not present

## 2022-12-20 ENCOUNTER — Other Ambulatory Visit: Payer: Self-pay | Admitting: Hematology

## 2022-12-20 ENCOUNTER — Other Ambulatory Visit (HOSPITAL_COMMUNITY): Payer: Self-pay

## 2022-12-20 ENCOUNTER — Other Ambulatory Visit: Payer: Self-pay

## 2022-12-20 DIAGNOSIS — C8300 Small cell B-cell lymphoma, unspecified site: Secondary | ICD-10-CM

## 2022-12-20 MED ORDER — BRUKINSA 80 MG PO CAPS
160.0000 mg | ORAL_CAPSULE | Freq: Two times a day (BID) | ORAL | 1 refills | Status: DC
  Filled 2022-12-20: qty 120, 30d supply, fill #0
  Filled 2023-01-17: qty 120, 30d supply, fill #1

## 2022-12-23 ENCOUNTER — Other Ambulatory Visit (HOSPITAL_COMMUNITY): Payer: Self-pay

## 2022-12-30 DIAGNOSIS — J449 Chronic obstructive pulmonary disease, unspecified: Secondary | ICD-10-CM | POA: Diagnosis not present

## 2022-12-30 DIAGNOSIS — I10 Essential (primary) hypertension: Secondary | ICD-10-CM | POA: Diagnosis not present

## 2023-01-05 ENCOUNTER — Other Ambulatory Visit (HOSPITAL_COMMUNITY): Payer: Self-pay

## 2023-01-12 ENCOUNTER — Inpatient Hospital Stay: Payer: HMO | Attending: Hematology

## 2023-01-12 ENCOUNTER — Ambulatory Visit (HOSPITAL_COMMUNITY)
Admission: RE | Admit: 2023-01-12 | Discharge: 2023-01-12 | Disposition: A | Discharge status: Home / Self Care | Payer: HMO | Source: Ambulatory Visit | Attending: Hematology | Admitting: Hematology

## 2023-01-12 DIAGNOSIS — I129 Hypertensive chronic kidney disease with stage 1 through stage 4 chronic kidney disease, or unspecified chronic kidney disease: Secondary | ICD-10-CM | POA: Diagnosis not present

## 2023-01-12 DIAGNOSIS — N189 Chronic kidney disease, unspecified: Secondary | ICD-10-CM | POA: Diagnosis not present

## 2023-01-12 DIAGNOSIS — Z87891 Personal history of nicotine dependence: Secondary | ICD-10-CM | POA: Insufficient documentation

## 2023-01-12 DIAGNOSIS — E041 Nontoxic single thyroid nodule: Secondary | ICD-10-CM | POA: Insufficient documentation

## 2023-01-12 DIAGNOSIS — Z79899 Other long term (current) drug therapy: Secondary | ICD-10-CM | POA: Diagnosis not present

## 2023-01-12 DIAGNOSIS — Z808 Family history of malignant neoplasm of other organs or systems: Secondary | ICD-10-CM | POA: Diagnosis not present

## 2023-01-12 DIAGNOSIS — D649 Anemia, unspecified: Secondary | ICD-10-CM | POA: Insufficient documentation

## 2023-01-12 DIAGNOSIS — C8305 Small cell B-cell lymphoma, lymph nodes of inguinal region and lower limb: Secondary | ICD-10-CM | POA: Insufficient documentation

## 2023-01-12 DIAGNOSIS — C8300 Small cell B-cell lymphoma, unspecified site: Secondary | ICD-10-CM | POA: Insufficient documentation

## 2023-01-12 DIAGNOSIS — C8591 Non-Hodgkin lymphoma, unspecified, lymph nodes of head, face, and neck: Secondary | ICD-10-CM | POA: Diagnosis not present

## 2023-01-12 LAB — CBC WITH DIFFERENTIAL/PLATELET
Abs Immature Granulocytes: 0.03 10*3/uL (ref 0.00–0.07)
Basophils Absolute: 0.1 10*3/uL (ref 0.0–0.1)
Basophils Relative: 1 %
Eosinophils Absolute: 0.3 10*3/uL (ref 0.0–0.5)
Eosinophils Relative: 4 %
HCT: 41.5 % (ref 39.0–52.0)
Hemoglobin: 14.1 g/dL (ref 13.0–17.0)
Immature Granulocytes: 0 %
Lymphocytes Relative: 30 %
Lymphs Abs: 2.4 10*3/uL (ref 0.7–4.0)
MCH: 30.8 pg (ref 26.0–34.0)
MCHC: 34 g/dL (ref 30.0–36.0)
MCV: 90.6 fL (ref 80.0–100.0)
Monocytes Absolute: 0.7 10*3/uL (ref 0.1–1.0)
Monocytes Relative: 8 %
Neutro Abs: 4.6 10*3/uL (ref 1.7–7.7)
Neutrophils Relative %: 57 %
Platelets: 233 10*3/uL (ref 150–400)
RBC: 4.58 MIL/uL (ref 4.22–5.81)
RDW: 12.8 % (ref 11.5–15.5)
WBC: 8.1 10*3/uL (ref 4.0–10.5)
nRBC: 0 % (ref 0.0–0.2)

## 2023-01-12 LAB — COMPREHENSIVE METABOLIC PANEL
ALT: 20 U/L (ref 0–44)
AST: 13 U/L — ABNORMAL LOW (ref 15–41)
Albumin: 3.6 g/dL (ref 3.5–5.0)
Alkaline Phosphatase: 58 U/L (ref 38–126)
Anion gap: 7 (ref 5–15)
BUN: 45 mg/dL — ABNORMAL HIGH (ref 8–23)
CO2: 28 mmol/L (ref 22–32)
Calcium: 8.6 mg/dL — ABNORMAL LOW (ref 8.9–10.3)
Chloride: 102 mmol/L (ref 98–111)
Creatinine, Ser: 2.34 mg/dL — ABNORMAL HIGH (ref 0.61–1.24)
GFR, Estimated: 28 mL/min — ABNORMAL LOW (ref >60)
Glucose, Bld: 127 mg/dL — ABNORMAL HIGH (ref 70–99)
Potassium: 4.1 mmol/L (ref 3.5–5.1)
Sodium: 137 mmol/L (ref 135–145)
Total Bilirubin: 1.2 mg/dL (ref 0.3–1.2)
Total Protein: 7.2 g/dL (ref 6.5–8.1)

## 2023-01-12 LAB — URIC ACID: Uric Acid, Serum: 6.4 mg/dL (ref 3.7–8.6)

## 2023-01-12 LAB — IRON AND TIBC
Iron: 70 ug/dL (ref 45–182)
Saturation Ratios: 23 % (ref 17.9–39.5)
TIBC: 302 ug/dL (ref 250–450)
UIBC: 232 ug/dL

## 2023-01-12 LAB — FERRITIN: Ferritin: 72 ng/mL (ref 24–336)

## 2023-01-12 LAB — LACTATE DEHYDROGENASE: LDH: 129 U/L (ref 98–192)

## 2023-01-12 MED ORDER — FLUDEOXYGLUCOSE F - 18 (FDG) INJECTION
10.6000 | Freq: Once | INTRAVENOUS | Status: AC | PRN
  Administered 2023-01-12: 9.38 via INTRAVENOUS

## 2023-01-16 NOTE — Progress Notes (Signed)
[facility] [address] [place] [address]    Clinic Day:  01/17/2023  Referring physician: Kirstie Peri, MD  Patient Care Team: Kirstie Peri, MD as PCP - General (Internal Medicine)   ASSESSMENT & PLAN:   Assessment: 1.  Stage III small lymphocytic lymphoma: - Recent CT for hematuria work-up on 04/05/2018 showed several retroperitoneal lymph nodes measuring 1.2 cm and pelvic lymph nodes bilaterally, largest in the left obturator region measuring 2.2 cm. -Patient does not have any B symptoms including fevers, night sweats or weight loss. - CT scan from 2010 done at [facility] also showed lymphadenopathy and splenomegaly.  - PET/CT scan on 05/28/2018 showed very low metabolic activity associated with enlarged pelvic and retroperitoneal and axillary nodes.  Spleen and bone marrow are normal.  We discussed the normal progression of low-grade lymphomas in detail. - Ultrasound of the abdomen from 05/30/2020 from [facility] showed normal liver.  Splenomegaly measuring 13.4 x 7.8 x 15.4 cm. - he has history of lymphadenopathy in the retroperitoneal region dating back to 2010.  He does not have any B symptoms or cytopenias. - His renal function has been worsening lately.  Renal ultrasound on 08/09/2021 showed bilateral cortical irregularity consistent with scarring with normal renal echogenicity.  Mild left hydronephrosis. - CT renal study on 09/08/2021: Splenomegaly.  New para-aortic lymphadenopathy.  Hazy retroperitoneum surrounding the kidneys and aortic adenopathy.  Lymph node left of the aorta at the level of the left kidney measures 15 mm enlarged from prior PET scan.  Multiple periaortic lymph nodes similar in size.  Adenopathy extends along the iliac chains with bulky external iliac nodes. - LDH level is normal.  The creatinine is 1.73. - PET scan (10/18/2021): Mildly metabolic adenopathy above and below the diaphragm with mild splenomegaly, worsened  since PET scan from 2019.  SUV of the lymph nodes is less than 5.  Left periaortic lymph node at the level of the left kidney measures 1.8 cm in short axis with SUV 3.2, previously measuring 6 mm on PET scan from 2019.  Left inguinal lymph node measures 2.1 cm with SUV 3.8.  Hazy retroperitoneal and mesenteric stranding with low-level associated metabolic activity in the left infrarenal retroperitoneum with SUV 3.1. - There is a question of whether this retroperitoneal/mesenteric stranding is contributing to his hydronephrosis causing worsening renal function. - Left inguinal lymph node biopsy (11/01/2021): Small lymphocytic lymphoma - CLL FISH panel: Trisomy 12 (neutral prognosis), deletion 13 q. 14 - Indication for treatment: Threatened endorgan function - Zanubrutinib 160 mg twice daily started on 11/18/2021. - PET scan (02/24/2022): Good response to treatment with significant interval decrease in size of lymphadenopathy in the neck, chest, abdomen and pelvis and no significant residual hypermetabolic some (Deauville 1).  Stable mild splenomegaly.    Plan: 1.  Stage III small lymphocytic lymphoma: -He is tolerating zanubrutinib twice daily very well. - Physical exam: No palpable adenopathy or splenomegaly. - PET scan (01/12/2023): No signs of tracer avid tumor.  Continued interval decrease in size of abdominal and pelvic lymph nodes.  No new sites seen.  Borderline increased size of spleen without increased uptake. - Labs from 01/12/2023 shows normal LFTs.  CBC grossly normal. - Continues Zanubrutinib 160 mg twice daily. - RTC 3 months for follow-up with repeat labs and exam.   2.  Mild normocytic anemia: -Combination anemia from CKD, relative iron deficiency. - Ferritin is 72, percent saturation 23.  Hemoglobin improved to 14.1.  Continue iron tablet daily.  No indication for parenteral iron therapy.  3.  CKD: - Continue farxiga10 mg daily.  Creatinine today is stable at 2.34.  4.  Gout  prophylaxis: - Continue allopurinol 100 mg daily.   5.  Hypertension: - Continue telmisartan, Toprol-XL and Norvasc.  Blood pressure today is 140/88.  6.  Right thyroid nodule: - PET scan showed incidental 1.5 cm right lower thyroid nodule. - We discussed the differential diagnosis.  Recommend thyroid ultrasound prior to next visit.    Orders Placed This Encounter  Procedures   US THYROID    Standing Status:   Future    Standing Expiration Date:   01/17/2024    Order Specific Question:   Reason for Exam (SYMPTOM  OR DIAGNOSIS REQUIRED)    Answer:   RT 1.5cm thyroid nodule    Order Specific Question:   Preferred imaging location?    Answer:   [facility]    Order Specific Question:   Release to patient    Answer:   Immediate   CBC with Differential/Platelet    Standing Status:   Future    Standing Expiration Date:   01/17/2024    Order Specific Question:   Release to patient    Answer:   Immediate   Comprehensive metabolic panel    Standing Status:   Future    Standing Expiration Date:   01/17/2024    Order Specific Question:   Release to patient    Answer:   Immediate   Lactate dehydrogenase    Standing Status:   Future    Standing Expiration Date:   01/17/2024    Order Specific Question:   Release to patient    Answer:   Immediate   Ferritin    Standing Status:   Future    Standing Expiration Date:   01/17/2024    Order Specific Question:   Release to patient    Answer:   Immediate   Iron and TIBC    Standing Status:   Future    Standing Expiration Date:   01/17/2024    Order Specific Question:   Release to patient    Answer:   Immediate      I,Katie Daubenspeck,acting as a scribe for Doreatha Massed, MD.,have documented all relevant documentation on the behalf of Doreatha Massed, MD,as directed by  Doreatha Massed, MD while in the presence of Doreatha Massed, MD.   I, Doreatha Massed MD, have reviewed the above documentation for accuracy  and completeness, and I agree with the above.   Doreatha Massed, MD   6/18/20244:38 PM  CHIEF COMPLAINT:   Diagnosis: stage III small lymphocytic lymphoma    Cancer Staging  Small lymphocytic lymphoma (HCC) Staging form: Hodgkin and Non-Hodgkin Lymphoma, AJCC 8th Edition - Clinical stage from 11/10/2021: Stage III (Small lymphocytic leukemia) - Unsigned    Prior Therapy: none  Current Therapy:  Zanubrutinib 160 mg twice daily    HISTORY OF PRESENT ILLNESS:   Oncology History   No history exists.     INTERVAL HISTORY:   Kevin Hooper is a 77 y.o. male presenting to clinic today for follow up of stage III small lymphocytic lymphoma. He was last seen by me on 10/10/22.  Since his last visit, he underwent restaging PET scan on 01/12/23.   Today, he states that he is doing well overall. His appetite level is at 100%. His energy level is at 100%.  PAST MEDICAL HISTORY:   Past Medical History:  Past Medical History:  Diagnosis Date   Arthritis    Cataracts, bilateral 11/2016   Chronic kidney disease    sTAGE 3   Diabetes mellitus without complication (HCC)    Gout    Hyperlipidemia    Hypertension    Joint ache    Prostate atrophy 2018    Surgical History: Past Surgical History:  Procedure Laterality Date   COLON RESECTION  2015   EXCISION OF SKIN TAG Left 11/01/2021   Procedure: EXCISION OF SKIN TAG;  Surgeon: Lucretia Roers, MD;  Location: [facility];  Service: General;  Laterality: Left;   HERNIA REPAIR     UMBILICAL   INGUINAL LYMPH NODE BIOPSY Left 11/01/2021   Procedure: INGUINAL LYMPH NODE BIOPSY- LEFT;  Surgeon: Lucretia Roers, MD;  Location: [facility];  Service: General;  Laterality: Left;   LUMBAR FUSION  1990   OTHER SURGICAL HISTORY Left    Arm s/p accident   REPLACEMENT TOTAL KNEE BILATERAL Bilateral 1990   TRANSURETHRAL RESECTION OF PROSTATE N/A 07/04/2017   Procedure: TRANSURETHRAL RESECTION OF THE PROSTATE (TURP);  Surgeon: Bjorn Pippin, MD;  Location:  [facility];  Service: Urology;  Laterality: N/A;    Social History: Social History   Socioeconomic History   Marital status: Married    Spouse name: Not on file   Number of children: 2   Years of education: 12+   Highest education level: Not on file  Occupational History   Occupation: Retired  Tobacco Use   Smoking status: Former    Packs/day: 2.00    Years: 25.00    Additional pack years: 0.00    Total pack years: 50.00    Types: Cigarettes    Quit date: 04/28/1992    Years since quitting: 30.7   Smokeless tobacco: Never  Vaping Use   Vaping Use: Never used  Substance and Sexual Activity   Alcohol use: Yes    Comment: Occasional   Drug use: No   Sexual activity: Not on file    Comment: Married  Other Topics Concern   Not on file  Social History Narrative   Lives at home w/ his wife   Right-handed   Caffeine: occasional tea   Social Determinants of Health   Financial Resource Strain: Low Risk  (06/16/2020)   Overall Financial Resource Strain (CARDIA)    Difficulty of Paying Living Expenses: Not hard at all  Food Insecurity: No Food Insecurity (06/16/2020)   Hunger Vital Sign    Worried About Running Out of Food in the Last Year: Never true    Ran Out of Food in the Last Year: Never true  Transportation Needs: No Transportation Needs (06/16/2020)   PRAPARE - Administrator, Civil Service (Medical): No    Lack of Transportation (Non-Medical): No  Physical Activity: Insufficiently Active (06/16/2020)   Exercise Vital Sign    Days of Exercise per Week: 7 days    Minutes of Exercise per Session: 20 min  Stress: No Stress Concern Present (06/16/2020)   [employer] of Occupational Health - Occupational Stress Questionnaire    Feeling of Stress : Not at all  Social Connections: Moderately Integrated (06/16/2020)   Social Connection and Isolation Panel [NHANES]    Frequency of Communication with Friends and Family: More than three times a week     Frequency of Social Gatherings with Friends and Family: Twice a week    Attends Religious Services: More than 4 times per year  Active Member of Clubs or Organizations: No    Attends Banker Meetings: Never    Marital Status: Married  Catering manager Violence: Not At Risk (06/16/2020)   Humiliation, Afraid, Rape, and Kick questionnaire    Fear of Current or Ex-Partner: No    Emotionally Abused: No    Physically Abused: No    Sexually Abused: No    Family History: Family History  Problem Relation Age of Onset   COPD Father    Throat cancer Father    Cancer Brother     Current Medications:  Current Outpatient Medications:    acetaminophen (TYLENOL) 650 MG CR tablet, Take 650 mg by mouth every 8 (eight) hours as needed (Arthritis)., Disp: , Rfl:    albuterol (PROVENTIL HFA;VENTOLIN HFA) 108 (90 Base) MCG/ACT inhaler, Inhale 2 puffs into the lungs every 6 (six) hours as needed for wheezing or shortness of breath. , Disp: , Rfl:    allopurinol (ZYLOPRIM) 300 MG tablet, TAKE ONE TABLET BY MOUTH ONCE DAILY, Disp: 30 tablet, Rfl: 3   amLODipine (NORVASC) 10 MG tablet, Take 10 mg by mouth daily., Disp: , Rfl:    b complex vitamins capsule, Take 1 capsule by mouth daily. Folic acid, Disp: , Rfl:    Blood Glucose Monitoring Suppl (ONETOUCH VERIO FLEX SYSTEM) w/Device KIT, daily., Disp: , Rfl:    calcitRIOL (ROCALTROL) 0.25 MCG capsule, Take 0.25 mcg by mouth as directed. M-W-F, Disp: , Rfl:    FARXIGA 10 MG TABS tablet, Take 1 tablet (10 mg total) by mouth daily. (Patient taking differently: Take 10 mg by mouth as directed. M-W-F), Disp: 30 tablet, Rfl: 0   glucosamine-chondroitin 500-400 MG tablet, Take 1 tablet by mouth daily., Disp: , Rfl:    hydrochlorothiazide (HYDRODIURIL) 25 MG tablet, Take 25 mg by mouth daily., Disp: , Rfl:    Lancets (ONETOUCH DELICA PLUS LANCET33G) MISC, See admin instructions., Disp: , Rfl:    MEGARED OMEGA-3 KRILL OIL PO, Take by mouth daily.,  Disp: , Rfl:    metoprolol succinate (TOPROL-XL) 100 MG 24 hr tablet, Take 100 mg by mouth daily. , Disp: , Rfl: 1   Multiple Vitamins-Minerals (MULTIVITAMIN WITH MINERALS) tablet, Take 1 tablet by mouth daily., Disp: , Rfl:    ONETOUCH VERIO test strip, 1 each daily., Disp: , Rfl:    rosuvastatin (CRESTOR) 5 MG tablet, Take 5 mg by mouth at bedtime., Disp: , Rfl:    telmisartan (MICARDIS) 20 MG tablet, Take 20 mg by mouth as directed. Tues-Thurs-Sat-Sun, Disp: , Rfl:    vitamin C (ASCORBIC ACID) 500 MG tablet, Take 1,000 mg by mouth daily., Disp: , Rfl:    zanubrutinib (BRUKINSA) 80 MG capsule, Take 2 tablets (160 mg) by mouth 2 (two) times daily., Disp: 120 capsule, Rfl: 1   Allergies: No Known Allergies  REVIEW OF SYSTEMS:   Review of Systems  Constitutional:  Negative for chills, fatigue and fever.  HENT:   Negative for lump/mass, mouth sores, nosebleeds, sore throat and trouble swallowing.   Eyes:  Negative for eye problems.  Respiratory:  Negative for cough and shortness of breath.   Cardiovascular:  Negative for chest pain, leg swelling and palpitations.  Gastrointestinal:  Negative for abdominal pain, constipation, diarrhea, nausea and vomiting.  Genitourinary:  Negative for bladder incontinence, difficulty urinating, dysuria, frequency, hematuria and nocturia.   Musculoskeletal:  Negative for arthralgias, back pain, flank pain, myalgias and neck pain.  Skin:  Negative for itching and rash.  Neurological:  Positive for numbness. Negative for dizziness and headaches.  Hematological:  Does not bruise/bleed easily.  Psychiatric/Behavioral:  Negative for depression, sleep disturbance and suicidal ideas. The patient is not nervous/anxious.   All other systems reviewed and are negative.    VITALS:   Blood pressure (!) 142/88, pulse 79, temperature 97.6 F (36.4 C), temperature source Tympanic, resp. rate 18, weight 185 lb (83.9 kg), SpO2 97 %.  Wt Readings from Last 3 Encounters:   01/17/23 185 lb (83.9 kg)  10/10/22 185 lb (83.9 kg)  08/04/22 182 lb 9.6 oz (82.8 kg)    Body mass index is 27.72 kg/m.  Performance status (ECOG): 1 - Symptomatic but completely ambulatory  PHYSICAL EXAM:   Physical Exam Vitals and nursing note reviewed. Exam conducted with a chaperone present.  Constitutional:      Appearance: Normal appearance.  Cardiovascular:     Rate and Rhythm: Normal rate and regular rhythm.     Pulses: Normal pulses.     Heart sounds: Normal heart sounds.  Pulmonary:     Effort: Pulmonary effort is normal.     Breath sounds: Normal breath sounds.  Abdominal:     Palpations: Abdomen is soft. There is no hepatomegaly, splenomegaly or mass.     Tenderness: There is no abdominal tenderness.  Musculoskeletal:     Right lower leg: No edema.     Left lower leg: No edema.  Lymphadenopathy:     Cervical: No cervical adenopathy.     Right cervical: No superficial, deep or posterior cervical adenopathy.    Left cervical: No superficial, deep or posterior cervical adenopathy.     Upper Body:     Right upper body: No supraclavicular or axillary adenopathy.     Left upper body: No supraclavicular or axillary adenopathy.  Neurological:     General: No focal deficit present.     Mental Status: He is alert and oriented to person, place, and time.  Psychiatric:        Mood and Affect: Mood normal.        Behavior: Behavior normal.     LABS:      Latest Ref Rng & Units 01/12/2023   10:59 AM 10/03/2022   11:34 AM 08/02/2022    8:45 AM  CBC  WBC 4.0 - 10.5 K/uL 8.1  5.8  8.4   Hemoglobin 13.0 - 17.0 g/dL 16.1  09.6  04.5   Hematocrit 39.0 - 52.0 % 41.5  35.3  36.8   Platelets 150 - 400 K/uL 233  223  173       Latest Ref Rng & Units 01/12/2023   10:59 AM 10/03/2022   11:34 AM 08/02/2022    8:45 AM  CMP  Glucose 70 - 99 mg/dL 409  811  914   BUN 8 - 23 mg/dL 45  40  52   Creatinine 0.61 - 1.24 mg/dL 7.82  9.56  2.13   Sodium 135 - 145 mmol/L 137  138   135   Potassium 3.5 - 5.1 mmol/L 4.1  4.9  4.3   Chloride 98 - 111 mmol/L 102  104  104   CO2 22 - 32 mmol/L 28  26  24    Calcium 8.9 - 10.3 mg/dL 8.6  8.6  8.1   Total Protein 6.5 - 8.1 g/dL 7.2  7.1  6.9   Total Bilirubin 0.3 - 1.2 mg/dL 1.2  0.9  1.2   Alkaline Phos 38 - 126 U/L 58  62  58   AST 15 - 41 U/L 13  15  12    ALT 0 - 44 U/L 20  17  14       No results found for: "CEA1", "CEA" / No results found for: "CEA1", "CEA" Lab Results  Component Value Date   PSA1 4.7 (H) 09/15/2021   No results found for: "ZOX096" No results found for: "CAN125"  No results found for: "TOTALPROTELP", "ALBUMINELP", "A1GS", "A2GS", "BETS", "BETA2SER", "GAMS", "MSPIKE", "SPEI" Lab Results  Component Value Date   TIBC 302 01/12/2023   TIBC 289 10/03/2022   TIBC 284 08/02/2022   FERRITIN 72 01/12/2023   FERRITIN 77 10/03/2022   FERRITIN 72 08/02/2022   IRONPCTSAT 23 01/12/2023   IRONPCTSAT 22 10/03/2022   IRONPCTSAT 28 08/02/2022   Lab Results  Component Value Date   LDH 129 01/12/2023   LDH 130 10/03/2022   LDH 138 08/02/2022     STUDIES:   NM PET Image Restag (PS) Skull Base To Thigh  Result Date: 01/17/2023 CLINICAL DATA:  Subsequent treatment strategy for lymphocytic lymphoma. EXAM: NUCLEAR MEDICINE PET SKULL BASE TO THIGH TECHNIQUE: 9.38 mCi F-18 FDG was injected intravenously. Full-ring PET imaging was performed from the skull base to thigh after the radiotracer. CT data was obtained and used for attenuation correction and anatomic localization. Fasting blood glucose: 157 mg/dl COMPARISON:  04/54/0981 FINDINGS: Mediastinal blood pool activity: SUV max 2.31 Liver activity: SUV max 3.24 NECK: No hypermetabolic lymph nodes in the neck. Incidental CT findings: 1.5 cm right lobe of thyroid gland nodule is identified which does not show significant uptake above background thyroid activity. CHEST: No tracer avid supraclavicular, axillary, mediastinal, or hilar lymph nodes. No suspicious  pulmonary nodules identified. Incidental CT findings: Aortic atherosclerosis. Coronary artery calcifications. ABDOMEN/PELVIS: There is no abnormal tracer uptake within the liver, pancreas, spleen or adrenal glands. The spleen is upper limits of normal in size measuring 13.1 cm cranial caudal dimension. Persistent prominent abdominal and pelvic lymph nodes without significant radiotracer uptake. Index left periaortic lymph node measures 1.2 cm with SUV max of 1.53, image 169/3. Formally this measured 1.7 cm with SUV max of 3.2. Index aortocaval lymph node measures 1 cm with SUV max of 1.23, image 176/3. Formally 1 cm with SUV max of 2.91. Index right external iliac lymph node measures 0.9 cm with SUV max of 1.08, image 231/3. Formally 1.1 cm. Index left inguinal lymph node measures 1 cm with SUV max of 0.90, image 246/3. Formally 1.6 cm with SUV max of 2.64. Incidental CT findings: Prostate gland enlargement with signs of previous TURP. Increased uptake within the central gland is likely urine activity within TURP defect. SKELETON: No focal hypermetabolic activity to suggest skeletal metastasis. Incidental CT findings: None. IMPRESSION: 1. No signs of recurrent tracer avid tumor.  (Deauville criteria 1). 2. Continued, interval decrease in size and degree of FDG uptake associated with abdominal and pelvic lymph nodes. 3. No new sites of disease identified. 4. Borderline increased size of the spleen without diffuse or focal increased uptake. 5. 1.5 cm right lobe of thyroid gland nodule. Recommend thyroid US (ref: J Am Coll Radiol. 2015 Feb;12(2): 143-50). 6. Coronary artery calcifications. 7. Prostate gland enlargement with signs of previous TURP. 8.  Aortic Atherosclerosis (ICD10-I70.0). Electronically Signed   By: Signa Kell M.D.   On: 01/17/2023 08:34

## 2023-01-17 ENCOUNTER — Other Ambulatory Visit (HOSPITAL_COMMUNITY): Payer: Self-pay

## 2023-01-17 ENCOUNTER — Inpatient Hospital Stay (HOSPITAL_BASED_OUTPATIENT_CLINIC_OR_DEPARTMENT_OTHER): Payer: HMO | Admitting: Hematology

## 2023-01-17 ENCOUNTER — Encounter: Payer: Self-pay | Admitting: Hematology

## 2023-01-17 VITALS — BP 142/88 | HR 79 | Temp 97.6°F | Resp 18 | Wt 185.0 lb

## 2023-01-17 DIAGNOSIS — C8300 Small cell B-cell lymphoma, unspecified site: Secondary | ICD-10-CM | POA: Diagnosis not present

## 2023-01-17 DIAGNOSIS — D631 Anemia in chronic kidney disease: Secondary | ICD-10-CM

## 2023-01-17 DIAGNOSIS — N189 Chronic kidney disease, unspecified: Secondary | ICD-10-CM | POA: Diagnosis not present

## 2023-01-17 DIAGNOSIS — C8305 Small cell B-cell lymphoma, lymph nodes of inguinal region and lower limb: Secondary | ICD-10-CM | POA: Diagnosis not present

## 2023-01-17 NOTE — Patient Instructions (Addendum)
[facility]  Discharge Instructions  You were seen and examined today by Dr. Ellin Saba.  Dr. Ellin Saba discussed your most recent lab work and CT scan which revealed that everything looks stable.  Dr. Ellin Saba will check a ultrasound of your Thyroid and repeat labs before your next appointment.   Follow-up as scheduled in 3 months.    Thank you for choosing [facility] to provide your oncology and hematology care.   To afford each patient quality time with our provider, please arrive at least 15 minutes before your scheduled appointment time. You may need to reschedule your appointment if you arrive late (10 or more minutes). Arriving late affects you and other patients whose appointments are after yours.  Also, if you miss three or more appointments without notifying the office, you may be dismissed from the clinic at the provider's discretion.    Again, thank you for choosing [facility].  Our hope is that these requests will decrease the amount of time that you wait before being seen by our physicians.   If you have a lab appointment with the Cancer Center - please note that after April 8th, all labs will be drawn in the cancer center.  You do not have to check in or register with the main entrance as you have in the past but will complete your check-in at the cancer center.            _____________________________________________________________  Should you have questions after your visit to [facility], please contact our office at [phone] and follow the prompts.  Our office hours are 8:00 a.m. to 4:30 p.m. Monday - Thursday and 8:00 a.m. to 2:30 p.m. Friday.  Please note that voicemails left after 4:00 p.m. may not be returned until the following business day.  We are closed weekends and all major holidays.  You do have access to a nurse 24-7, just call the main number to the clinic [phone]  and do not press any options, hold on the line and a nurse will answer the phone.    For prescription refill requests, have your pharmacy contact our office and allow 72 hours.    Masks are no longer required in the cancer centers. If you would like for your care team to wear a mask while they are taking care of you, please let them know. You may have one support person who is at least 75 years old accompany you for your appointments.

## 2023-01-23 ENCOUNTER — Other Ambulatory Visit (HOSPITAL_COMMUNITY): Payer: Self-pay

## 2023-01-24 DIAGNOSIS — I10 Essential (primary) hypertension: Secondary | ICD-10-CM | POA: Diagnosis not present

## 2023-01-24 DIAGNOSIS — Z299 Encounter for prophylactic measures, unspecified: Secondary | ICD-10-CM | POA: Diagnosis not present

## 2023-01-24 DIAGNOSIS — R319 Hematuria, unspecified: Secondary | ICD-10-CM | POA: Diagnosis not present

## 2023-01-24 DIAGNOSIS — R109 Unspecified abdominal pain: Secondary | ICD-10-CM | POA: Diagnosis not present

## 2023-01-31 DIAGNOSIS — R590 Localized enlarged lymph nodes: Secondary | ICD-10-CM | POA: Diagnosis not present

## 2023-01-31 DIAGNOSIS — I700 Atherosclerosis of aorta: Secondary | ICD-10-CM | POA: Diagnosis not present

## 2023-01-31 DIAGNOSIS — N400 Enlarged prostate without lower urinary tract symptoms: Secondary | ICD-10-CM | POA: Diagnosis not present

## 2023-01-31 DIAGNOSIS — N200 Calculus of kidney: Secondary | ICD-10-CM | POA: Diagnosis not present

## 2023-02-13 DIAGNOSIS — I129 Hypertensive chronic kidney disease with stage 1 through stage 4 chronic kidney disease, or unspecified chronic kidney disease: Secondary | ICD-10-CM | POA: Diagnosis not present

## 2023-02-13 DIAGNOSIS — R808 Other proteinuria: Secondary | ICD-10-CM | POA: Diagnosis not present

## 2023-02-13 DIAGNOSIS — D631 Anemia in chronic kidney disease: Secondary | ICD-10-CM | POA: Diagnosis not present

## 2023-02-13 DIAGNOSIS — D472 Monoclonal gammopathy: Secondary | ICD-10-CM | POA: Diagnosis not present

## 2023-02-13 DIAGNOSIS — C8590 Non-Hodgkin lymphoma, unspecified, unspecified site: Secondary | ICD-10-CM | POA: Diagnosis not present

## 2023-02-13 DIAGNOSIS — N184 Chronic kidney disease, stage 4 (severe): Secondary | ICD-10-CM | POA: Diagnosis not present

## 2023-02-13 DIAGNOSIS — E211 Secondary hyperparathyroidism, not elsewhere classified: Secondary | ICD-10-CM | POA: Diagnosis not present

## 2023-02-15 ENCOUNTER — Other Ambulatory Visit: Payer: Self-pay | Admitting: *Deleted

## 2023-02-15 ENCOUNTER — Other Ambulatory Visit: Payer: Self-pay | Admitting: Hematology

## 2023-02-15 ENCOUNTER — Other Ambulatory Visit (HOSPITAL_COMMUNITY): Payer: Self-pay

## 2023-02-15 DIAGNOSIS — C8300 Small cell B-cell lymphoma, unspecified site: Secondary | ICD-10-CM

## 2023-02-15 MED ORDER — BRUKINSA 80 MG PO CAPS
160.0000 mg | ORAL_CAPSULE | Freq: Two times a day (BID) | ORAL | 1 refills | Status: DC
Start: 2023-02-15 — End: 2023-04-20
  Filled 2023-02-15: qty 120, 30d supply, fill #0
  Filled 2023-03-14: qty 120, 30d supply, fill #1

## 2023-02-15 NOTE — Telephone Encounter (Signed)
Refill approved for Brukinsa.  Patient is tolerating and is to continue therapy.

## 2023-02-17 ENCOUNTER — Other Ambulatory Visit (HOSPITAL_COMMUNITY): Payer: Self-pay

## 2023-02-20 DIAGNOSIS — E211 Secondary hyperparathyroidism, not elsewhere classified: Secondary | ICD-10-CM | POA: Diagnosis not present

## 2023-02-20 DIAGNOSIS — R808 Other proteinuria: Secondary | ICD-10-CM | POA: Diagnosis not present

## 2023-02-20 DIAGNOSIS — D472 Monoclonal gammopathy: Secondary | ICD-10-CM | POA: Diagnosis not present

## 2023-03-14 ENCOUNTER — Other Ambulatory Visit (HOSPITAL_COMMUNITY): Payer: Self-pay

## 2023-03-17 DIAGNOSIS — E1165 Type 2 diabetes mellitus with hyperglycemia: Secondary | ICD-10-CM | POA: Diagnosis not present

## 2023-03-17 DIAGNOSIS — E1122 Type 2 diabetes mellitus with diabetic chronic kidney disease: Secondary | ICD-10-CM | POA: Diagnosis not present

## 2023-03-17 DIAGNOSIS — N2581 Secondary hyperparathyroidism of renal origin: Secondary | ICD-10-CM | POA: Diagnosis not present

## 2023-03-17 DIAGNOSIS — Z0000 Encounter for general adult medical examination without abnormal findings: Secondary | ICD-10-CM | POA: Diagnosis not present

## 2023-03-17 DIAGNOSIS — N183 Chronic kidney disease, stage 3 unspecified: Secondary | ICD-10-CM | POA: Diagnosis not present

## 2023-03-17 DIAGNOSIS — I10 Essential (primary) hypertension: Secondary | ICD-10-CM | POA: Diagnosis not present

## 2023-03-17 DIAGNOSIS — Z299 Encounter for prophylactic measures, unspecified: Secondary | ICD-10-CM | POA: Diagnosis not present

## 2023-03-21 ENCOUNTER — Other Ambulatory Visit (HOSPITAL_COMMUNITY): Payer: Self-pay

## 2023-04-12 ENCOUNTER — Other Ambulatory Visit (HOSPITAL_COMMUNITY): Payer: Self-pay

## 2023-04-20 ENCOUNTER — Other Ambulatory Visit: Payer: Self-pay | Admitting: *Deleted

## 2023-04-20 ENCOUNTER — Other Ambulatory Visit: Payer: Self-pay | Admitting: Hematology

## 2023-04-20 ENCOUNTER — Other Ambulatory Visit (HOSPITAL_COMMUNITY): Payer: Self-pay

## 2023-04-20 ENCOUNTER — Other Ambulatory Visit: Payer: Self-pay

## 2023-04-20 DIAGNOSIS — C8300 Small cell B-cell lymphoma, unspecified site: Secondary | ICD-10-CM

## 2023-04-20 MED ORDER — BRUKINSA 80 MG PO CAPS
160.0000 mg | ORAL_CAPSULE | Freq: Two times a day (BID) | ORAL | 1 refills | Status: DC
Start: 2023-04-20 — End: 2023-06-14
  Filled 2023-04-20: qty 120, 30d supply, fill #0
  Filled 2023-05-23: qty 120, 30d supply, fill #1

## 2023-04-20 NOTE — Telephone Encounter (Signed)
Zanubrutinib refill approved.  Patient is tolerating and is to continue therapy.

## 2023-04-24 ENCOUNTER — Ambulatory Visit (HOSPITAL_COMMUNITY)
Admission: RE | Admit: 2023-04-24 | Discharge: 2023-04-24 | Disposition: A | Discharge status: Home / Self Care | Payer: HMO | Source: Ambulatory Visit | Attending: Hematology | Admitting: Hematology

## 2023-04-24 ENCOUNTER — Inpatient Hospital Stay: Payer: HMO | Attending: Hematology

## 2023-04-24 DIAGNOSIS — E042 Nontoxic multinodular goiter: Secondary | ICD-10-CM | POA: Diagnosis not present

## 2023-04-24 DIAGNOSIS — C8300 Small cell B-cell lymphoma, unspecified site: Secondary | ICD-10-CM

## 2023-04-24 DIAGNOSIS — N189 Chronic kidney disease, unspecified: Secondary | ICD-10-CM | POA: Insufficient documentation

## 2023-04-24 DIAGNOSIS — C8305 Small cell B-cell lymphoma, lymph nodes of inguinal region and lower limb: Secondary | ICD-10-CM | POA: Insufficient documentation

## 2023-04-24 DIAGNOSIS — D631 Anemia in chronic kidney disease: Secondary | ICD-10-CM | POA: Insufficient documentation

## 2023-04-24 LAB — CBC WITH DIFFERENTIAL/PLATELET
Abs Immature Granulocytes: 0.04 10*3/uL (ref 0.00–0.07)
Basophils Absolute: 0 10*3/uL (ref 0.0–0.1)
Basophils Relative: 1 %
Eosinophils Absolute: 0.2 10*3/uL (ref 0.0–0.5)
Eosinophils Relative: 4 %
HCT: 35.8 % — ABNORMAL LOW (ref 39.0–52.0)
Hemoglobin: 12.3 g/dL — ABNORMAL LOW (ref 13.0–17.0)
Immature Granulocytes: 1 %
Lymphocytes Relative: 31 %
Lymphs Abs: 1.8 10*3/uL (ref 0.7–4.0)
MCH: 31.5 pg (ref 26.0–34.0)
MCHC: 34.4 g/dL (ref 30.0–36.0)
MCV: 91.6 fL (ref 80.0–100.0)
Monocytes Absolute: 0.5 10*3/uL (ref 0.1–1.0)
Monocytes Relative: 8 %
Neutro Abs: 3.3 10*3/uL (ref 1.7–7.7)
Neutrophils Relative %: 55 %
Platelets: 185 10*3/uL (ref 150–400)
RBC: 3.91 MIL/uL — ABNORMAL LOW (ref 4.22–5.81)
RDW: 12.9 % (ref 11.5–15.5)
WBC: 5.9 10*3/uL (ref 4.0–10.5)
nRBC: 0 % (ref 0.0–0.2)

## 2023-04-24 LAB — COMPREHENSIVE METABOLIC PANEL
ALT: 17 U/L (ref 0–44)
AST: 15 U/L (ref 15–41)
Albumin: 3.4 g/dL — ABNORMAL LOW (ref 3.5–5.0)
Alkaline Phosphatase: 49 U/L (ref 38–126)
Anion gap: 11 (ref 5–15)
BUN: 33 mg/dL — ABNORMAL HIGH (ref 8–23)
CO2: 24 mmol/L (ref 22–32)
Calcium: 8.5 mg/dL — ABNORMAL LOW (ref 8.9–10.3)
Chloride: 105 mmol/L (ref 98–111)
Creatinine, Ser: 2.11 mg/dL — ABNORMAL HIGH (ref 0.61–1.24)
GFR, Estimated: 32 mL/min — ABNORMAL LOW (ref >60)
Glucose, Bld: 129 mg/dL — ABNORMAL HIGH (ref 70–99)
Potassium: 3.9 mmol/L (ref 3.5–5.1)
Sodium: 140 mmol/L (ref 135–145)
Total Bilirubin: 0.8 mg/dL (ref 0.3–1.2)
Total Protein: 6.5 g/dL (ref 6.5–8.1)

## 2023-04-24 LAB — IRON AND TIBC
Iron: 69 ug/dL (ref 45–182)
Saturation Ratios: 27 % (ref 17.9–39.5)
TIBC: 260 ug/dL (ref 250–450)
UIBC: 191 ug/dL

## 2023-04-24 LAB — LACTATE DEHYDROGENASE: LDH: 125 U/L (ref 98–192)

## 2023-04-24 LAB — FERRITIN: Ferritin: 75 ng/mL (ref 24–336)

## 2023-04-27 ENCOUNTER — Other Ambulatory Visit (HOSPITAL_COMMUNITY): Payer: Self-pay

## 2023-05-01 ENCOUNTER — Ambulatory Visit: Payer: HMO | Admitting: Hematology

## 2023-05-04 ENCOUNTER — Inpatient Hospital Stay: Payer: HMO | Attending: Hematology | Admitting: Hematology

## 2023-05-04 VITALS — BP 139/72 | HR 62 | Temp 98.1°F | Resp 18 | Ht 68.5 in | Wt 186.0 lb

## 2023-05-04 DIAGNOSIS — C8300 Small cell B-cell lymphoma, unspecified site: Secondary | ICD-10-CM

## 2023-05-04 DIAGNOSIS — E1122 Type 2 diabetes mellitus with diabetic chronic kidney disease: Secondary | ICD-10-CM | POA: Insufficient documentation

## 2023-05-04 DIAGNOSIS — I129 Hypertensive chronic kidney disease with stage 1 through stage 4 chronic kidney disease, or unspecified chronic kidney disease: Secondary | ICD-10-CM | POA: Insufficient documentation

## 2023-05-04 DIAGNOSIS — D472 Monoclonal gammopathy: Secondary | ICD-10-CM | POA: Diagnosis not present

## 2023-05-04 DIAGNOSIS — R591 Generalized enlarged lymph nodes: Secondary | ICD-10-CM

## 2023-05-04 DIAGNOSIS — N189 Chronic kidney disease, unspecified: Secondary | ICD-10-CM | POA: Insufficient documentation

## 2023-05-04 DIAGNOSIS — M109 Gout, unspecified: Secondary | ICD-10-CM | POA: Insufficient documentation

## 2023-05-04 DIAGNOSIS — R808 Other proteinuria: Secondary | ICD-10-CM | POA: Diagnosis not present

## 2023-05-04 DIAGNOSIS — E042 Nontoxic multinodular goiter: Secondary | ICD-10-CM | POA: Diagnosis not present

## 2023-05-04 DIAGNOSIS — R161 Splenomegaly, not elsewhere classified: Secondary | ICD-10-CM | POA: Insufficient documentation

## 2023-05-04 DIAGNOSIS — C8305 Small cell B-cell lymphoma, lymph nodes of inguinal region and lower limb: Secondary | ICD-10-CM | POA: Insufficient documentation

## 2023-05-04 DIAGNOSIS — D631 Anemia in chronic kidney disease: Secondary | ICD-10-CM | POA: Diagnosis not present

## 2023-05-04 DIAGNOSIS — E211 Secondary hyperparathyroidism, not elsewhere classified: Secondary | ICD-10-CM | POA: Diagnosis not present

## 2023-05-04 NOTE — Progress Notes (Signed)
[facility] [address], [place] [address]    Clinic Day:  05/04/2023  Referring physician: Kirstie Peri, MD  Patient Care Team: Kirstie Peri, MD as PCP - General (Internal Medicine)   ASSESSMENT & PLAN:   Assessment: 1.  Stage III small lymphocytic lymphoma: - Recent CT for hematuria work-up on 04/05/2018 showed several retroperitoneal lymph nodes measuring 1.2 cm and pelvic lymph nodes bilaterally, largest in the left obturator region measuring 2.2 cm. -Patient does not have any B symptoms including fevers, night sweats or weight loss. - CT scan from 2010 done at [facility] also showed lymphadenopathy and splenomegaly.  - PET/CT scan on 05/28/2018 showed very low metabolic activity associated with enlarged pelvic and retroperitoneal and axillary nodes.  Spleen and bone marrow are normal.  We discussed the normal progression of low-grade lymphomas in detail. - Ultrasound of the abdomen from 05/30/2020 from [facility] showed normal liver.  Splenomegaly measuring 13.4 x 7.8 x 15.4 cm. - he has history of lymphadenopathy in the retroperitoneal region dating back to 2010.  He does not have any B symptoms or cytopenias. - His renal function has been worsening lately.  Renal ultrasound on 08/09/2021 showed bilateral cortical irregularity consistent with scarring with normal renal echogenicity.  Mild left hydronephrosis. - CT renal study on 09/08/2021: Splenomegaly.  New para-aortic lymphadenopathy.  Hazy retroperitoneum surrounding the kidneys and aortic adenopathy.  Lymph node left of the aorta at the level of the left kidney measures 15 mm enlarged from prior PET scan.  Multiple periaortic lymph nodes similar in size.  Adenopathy extends along the iliac chains with bulky external iliac nodes. - LDH level is normal.  The creatinine is 1.73. - PET scan (10/18/2021): Mildly metabolic adenopathy above and below the diaphragm with mild splenomegaly, worsened  since PET scan from 2019.  SUV of the lymph nodes is less than 5.  Left periaortic lymph node at the level of the left kidney measures 1.8 cm in short axis with SUV 3.2, previously measuring 6 mm on PET scan from 2019.  Left inguinal lymph node measures 2.1 cm with SUV 3.8.  Hazy retroperitoneal and mesenteric stranding with low-level associated metabolic activity in the left infrarenal retroperitoneum with SUV 3.1. - There is a question of whether this retroperitoneal/mesenteric stranding is contributing to his hydronephrosis causing worsening renal function. - Left inguinal lymph node biopsy (11/01/2021): Small lymphocytic lymphoma - CLL FISH panel: Trisomy 12 (neutral prognosis), deletion 13 q. 14 - Indication for treatment: Threatened endorgan function - Zanubrutinib 160 mg twice daily started on 11/18/2021. - PET scan (02/24/2022): Good response to treatment with significant interval decrease in size of lymphadenopathy in the neck, chest, abdomen and pelvis and no significant residual hypermetabolic some (Deauville 1).  Stable mild splenomegaly.    Plan: 1.  Stage III small lymphocytic lymphoma: - PET scan on 01/12/2023: No signs of tracer avid tumor.  Continued interval decrease in size of the abdominal and pelvic lymph nodes.  Borderline enlarged spleen without increased uptake. - He is tolerating Zanubrutinib reasonably well. - Labs from 04/24/2023: White count and platelet count normal.  Hemoglobin 12.3.  LFTs are normal. - Continue Zanubrutinib 160 mg twice daily.  Recommend follow-up in 3 months with repeat labs.   2.  Mild normocytic anemia: - Combination anemia from CKD and relative iron deficiency. - Ferritin is 75 and percent saturation 27.  Hemoglobin is 12.3.  He will continue iron tablet daily.  3.  CKD: - Continue farxiga 10 mg daily.  Creatinine is stable at 2.11.  4.  Gout prophylaxis: - Continue allopurinol 100 mg daily.   5.  Hypertension: - Continue telmisartan, Toprol-XL  and Norvasc.  Blood pressure today is 139/72.  6.  Right thyroid nodule: - PET scan showed incidental 1.5 cm thyroid nodule. - I discussed ultrasound of the thyroid results.  Left inferior thyroid nodule measuring 1.8 cm meets criteria for biopsy. - I discussed the role of biopsy.  He is agreeable.  We will schedule it with interventional radiology. - We will schedule a phone visit after the biopsy to discuss results and further plan.    Orders Placed This Encounter  Procedures   [place] FNA BX THYROID 1ST LESION AFIRMA    IMPRESSION: Left inferior thyroid nodule (labeled 4, 1.8 cm) meets criteria for biopsy, as designated by the newly established ACR TI-RADS criteria, and referral for biopsy is recommended.   Recommendations follow those established by the new ACR TI-RADS criteria (J Am Coll Radiol 2017;14:587-595).     Electronically Signed   By: Gilmer Mor D.O.   On: 04/29/2023 06:03    Standing Status:   Future    Standing Expiration Date:   05/03/2024    Order Specific Question:   Reason for Exam (SYMPTOM  OR DIAGNOSIS REQUIRED)    Answer:   left thyroid nodule    Order Specific Question:   Preferred location?    Answer:   [facility]    Order Specific Question:   Release to patient    Answer:   Immediate   CBC with Differential/Platelet    Standing Status:   Future    Standing Expiration Date:   05/03/2024    Order Specific Question:   Release to patient    Answer:   Immediate   Comprehensive metabolic panel    Standing Status:   Future    Standing Expiration Date:   05/03/2024    Order Specific Question:   Release to patient    Answer:   Immediate   Lactate dehydrogenase    Standing Status:   Future    Standing Expiration Date:   05/03/2024    Order Specific Question:   Release to patient    Answer:   Immediate   Ferritin    Standing Status:   Future    Standing Expiration Date:   05/03/2024    Order Specific Question:   Release to patient    Answer:    Immediate   Iron and TIBC    Standing Status:   Future    Standing Expiration Date:   05/03/2024    Order Specific Question:   Release to patient    Answer:   Immediate      I,Helena R Teague,acting as a scribe for Doreatha Massed, MD.,have documented all relevant documentation on the behalf of Doreatha Massed, MD,as directed by  Doreatha Massed, MD while in the presence of Doreatha Massed, MD.  I, Doreatha Massed MD, have reviewed the above documentation for accuracy and completeness, and I agree with the above.    Doreatha Massed, MD   10/3/20244:59 PM  CHIEF COMPLAINT:   Diagnosis: stage III small lymphocytic lymphoma    Cancer Staging  Small lymphocytic lymphoma (HCC) Staging form: Hodgkin and Non-Hodgkin Lymphoma, AJCC 8th Edition - Clinical stage from 11/10/2021: Stage III (Small lymphocytic leukemia) - Unsigned    Prior Therapy: none  Current Therapy:  Zanubrutinib 160 mg twice daily  HISTORY OF PRESENT ILLNESS:   Oncology History   No history exists.     INTERVAL HISTORY:   Kevin Hooper is a 77 y.o. male presenting to clinic today for follow up of stage III small lymphocytic lymphoma. He was last seen by me on 01/17/23.  Since his last visit, he underwent US of the thyroid on 04/24/23 that found: left inferior thyroid nodule (labeled 4, 1.8 cm) meets criteria for biopsy.   Today, he states that he is doing well overall. His appetite level is at 100%. His energy level is at 80%. He is accompanied by his wife. He denies any active bleeding, unexpected weight loss, infections, fevers, or night sweats. He reports bruising easily.   PAST MEDICAL HISTORY:   Past Medical History: Past Medical History:  Diagnosis Date   Arthritis    Cataracts, bilateral 11/2016   Chronic kidney disease    sTAGE 3   Diabetes mellitus without complication (HCC)    Gout    Hyperlipidemia    Hypertension    Joint ache    Prostate atrophy 2018    Surgical  History: Past Surgical History:  Procedure Laterality Date   COLON RESECTION  2015   EXCISION OF SKIN TAG Left 11/01/2021   Procedure: EXCISION OF SKIN TAG;  Surgeon: Lucretia Roers, MD;  Location: [facility];  Service: General;  Laterality: Left;   HERNIA REPAIR     UMBILICAL   INGUINAL LYMPH NODE BIOPSY Left 11/01/2021   Procedure: INGUINAL LYMPH NODE BIOPSY- LEFT;  Surgeon: Lucretia Roers, MD;  Location: [facility];  Service: General;  Laterality: Left;   LUMBAR FUSION  1990   OTHER SURGICAL HISTORY Left    Arm s/p accident   REPLACEMENT TOTAL KNEE BILATERAL Bilateral 1990   TRANSURETHRAL RESECTION OF PROSTATE N/A 07/04/2017   Procedure: TRANSURETHRAL RESECTION OF THE PROSTATE (TURP);  Surgeon: Bjorn Pippin, MD;  Location: [facility];  Service: Urology;  Laterality: N/A;    Social History: Social History   Socioeconomic History   Marital status: Married    Spouse name: Not on file   Number of children: 2   Years of education: 12+   Highest education level: Not on file  Occupational History   Occupation: Retired  Tobacco Use   Smoking status: Former    Current packs/day: 0.00    Average packs/day: 2.0 packs/day for 25.0 years (50.0 ttl pk-yrs)    Types: Cigarettes    Start date: 04/29/1967    Quit date: 04/28/1992    Years since quitting: 31.0   Smokeless tobacco: Never  Vaping Use   Vaping status: Never Used  Substance and Sexual Activity   Alcohol use: Yes    Comment: Occasional   Drug use: No   Sexual activity: Not on file    Comment: Married  Other Topics Concern   Not on file  Social History Narrative   Lives at home w/ his wife   Right-handed   Caffeine: occasional tea   Social Determinants of Health   Financial Resource Strain: Low Risk  (06/16/2020)   Overall Financial Resource Strain (CARDIA)    Difficulty of Paying Living Expenses: Not hard at all  Food Insecurity: No Food Insecurity (06/16/2020)   Hunger Vital Sign    Worried About Running Out of Food in  the Last Year: Never true    Ran Out of Food in the Last Year: Never true  Transportation Needs: No Transportation Needs (06/16/2020)   PRAPARE - Transportation  Lack of Transportation (Medical): No    Lack of Transportation (Non-Medical): No  Physical Activity: Insufficiently Active (06/16/2020)   Exercise Vital Sign    Days of Exercise per Week: 7 days    Minutes of Exercise per Session: 20 min  Stress: No Stress Concern Present (06/16/2020)   [employer] of Occupational Health - Occupational Stress Questionnaire    Feeling of Stress : Not at all  Social Connections: Moderately Integrated (06/16/2020)   Social Connection and Isolation Panel [NHANES]    Frequency of Communication with Friends and Family: More than three times a week    Frequency of Social Gatherings with Friends and Family: Twice a week    Attends Religious Services: More than 4 times per year    Active Member of [employer] or Organizations: No    Attends Banker Meetings: Never    Marital Status: Married  Catering manager Violence: Not At Risk (06/16/2020)   Humiliation, Afraid, Rape, and Kick questionnaire    Fear of Current or Ex-Partner: No    Emotionally Abused: No    Physically Abused: No    Sexually Abused: No    Family History: Family History  Problem Relation Age of Onset   COPD Father    Throat cancer Father    Cancer Brother     Current Medications:  Current Outpatient Medications:    acetaminophen (TYLENOL) 650 MG CR tablet, Take 650 mg by mouth every 8 (eight) hours as needed (Arthritis)., Disp: , Rfl:    albuterol (PROVENTIL HFA;VENTOLIN HFA) 108 (90 Base) MCG/ACT inhaler, Inhale 2 puffs into the lungs every 6 (six) hours as needed for wheezing or shortness of breath. , Disp: , Rfl:    allopurinol (ZYLOPRIM) 300 MG tablet, TAKE ONE TABLET BY MOUTH ONCE DAILY, Disp: 30 tablet, Rfl: 3   amLODipine (NORVASC) 10 MG tablet, Take 10 mg by mouth daily., Disp: , Rfl:    b complex  vitamins capsule, Take 1 capsule by mouth daily. Folic acid, Disp: , Rfl:    Blood Glucose Monitoring Suppl (ONETOUCH VERIO FLEX SYSTEM) w/Device KIT, daily., Disp: , Rfl:    calcitRIOL (ROCALTROL) 0.25 MCG capsule, Take 0.25 mcg by mouth as directed. M-W-F, Disp: , Rfl:    FARXIGA 10 MG TABS tablet, Take 1 tablet (10 mg total) by mouth daily., Disp: 30 tablet, Rfl: 0   glucosamine-chondroitin 500-400 MG tablet, Take 1 tablet by mouth daily., Disp: , Rfl:    Lancets (ONETOUCH DELICA PLUS LANCET33G) MISC, See admin instructions., Disp: , Rfl:    MEGARED OMEGA-3 KRILL OIL PO, Take by mouth daily., Disp: , Rfl:    metoprolol succinate (TOPROL-XL) 100 MG 24 hr tablet, Take 100 mg by mouth daily. , Disp: , Rfl: 1   Multiple Vitamins-Minerals (MULTIVITAMIN WITH MINERALS) tablet, Take 1 tablet by mouth daily., Disp: , Rfl:    ONETOUCH VERIO test strip, 1 each daily., Disp: , Rfl:    rosuvastatin (CRESTOR) 5 MG tablet, Take 5 mg by mouth at bedtime., Disp: , Rfl:    telmisartan (MICARDIS) 20 MG tablet, Take 20 mg by mouth as directed. Tues-Thurs-Sat-Sun, Disp: , Rfl:    vitamin C (ASCORBIC ACID) 500 MG tablet, Take 1,000 mg by mouth daily., Disp: , Rfl:    zanubrutinib (BRUKINSA) 80 MG capsule, Take 2 tablets (160 mg) by mouth 2 (two) times daily., Disp: 120 capsule, Rfl: 1   Allergies: No Known Allergies  REVIEW OF SYSTEMS:   Review of Systems  Constitutional:  Negative for chills, fatigue and fever.  HENT:   Negative for lump/mass, mouth sores, nosebleeds, sore throat and trouble swallowing.   Eyes:  Negative for eye problems.  Respiratory:  Negative for cough and shortness of breath.   Cardiovascular:  Negative for chest pain, leg swelling and palpitations.  Gastrointestinal:  Negative for abdominal pain, constipation, diarrhea, nausea and vomiting.  Genitourinary:  Negative for bladder incontinence, difficulty urinating, dysuria, frequency, hematuria and nocturia.   Musculoskeletal:  Negative  for arthralgias, back pain, flank pain, myalgias and neck pain.  Skin:  Negative for itching and rash.  Neurological:  Negative for dizziness, headaches and numbness.       +tingling hands  Hematological:  Bruises/bleeds easily.  Psychiatric/Behavioral:  Negative for depression, sleep disturbance and suicidal ideas. The patient is not nervous/anxious.   All other systems reviewed and are negative.    VITALS:   Blood pressure 139/72, pulse 62, temperature 98.1 F (36.7 C), temperature source Oral, resp. rate 18, height 5' 8.5" (1.74 m), weight 186 lb (84.4 kg), SpO2 97%.  Wt Readings from Last 3 Encounters:  05/04/23 186 lb (84.4 kg)  01/17/23 185 lb (83.9 kg)  10/10/22 185 lb (83.9 kg)    Body mass index is 27.87 kg/m.  Performance status (ECOG): 1 - Symptomatic but completely ambulatory  PHYSICAL EXAM:   Physical Exam Vitals and nursing note reviewed. Exam conducted with a chaperone present.  Constitutional:      Appearance: Normal appearance.  Cardiovascular:     Rate and Rhythm: Normal rate and regular rhythm.     Pulses: Normal pulses.     Heart sounds: Normal heart sounds.  Pulmonary:     Effort: Pulmonary effort is normal.     Breath sounds: Normal breath sounds.  Abdominal:     Palpations: Abdomen is soft. There is no hepatomegaly, splenomegaly or mass.     Tenderness: There is no abdominal tenderness.  Musculoskeletal:     Right lower leg: No edema.     Left lower leg: No edema.  Lymphadenopathy:     Cervical: No cervical adenopathy.     Right cervical: No superficial, deep or posterior cervical adenopathy.    Left cervical: No superficial, deep or posterior cervical adenopathy.     Upper Body:     Right upper body: No supraclavicular or axillary adenopathy.     Left upper body: No supraclavicular or axillary adenopathy.  Neurological:     General: No focal deficit present.     Mental Status: He is alert and oriented to person, place, and time.   Psychiatric:        Mood and Affect: Mood normal.        Behavior: Behavior normal.     LABS:      Latest Ref Rng & Units 04/24/2023    2:05 PM 01/12/2023   10:59 AM 10/03/2022   11:34 AM  CBC  WBC 4.0 - 10.5 K/uL 5.9  8.1  5.8   Hemoglobin 13.0 - 17.0 g/dL 16.1  09.6  04.5   Hematocrit 39.0 - 52.0 % 35.8  41.5  35.3   Platelets 150 - 400 K/uL 185  233  223       Latest Ref Rng & Units 04/24/2023    2:05 PM 01/12/2023   10:59 AM 10/03/2022   11:34 AM  CMP  Glucose 70 - 99 mg/dL 409  811  914   BUN 8 - 23 mg/dL 33  45  40  Creatinine 0.61 - 1.24 mg/dL 1.61  0.96  0.45   Sodium 135 - 145 mmol/L 140  137  138   Potassium 3.5 - 5.1 mmol/L 3.9  4.1  4.9   Chloride 98 - 111 mmol/L 105  102  104   CO2 22 - 32 mmol/L 24  28  26    Calcium 8.9 - 10.3 mg/dL 8.5  8.6  8.6   Total Protein 6.5 - 8.1 g/dL 6.5  7.2  7.1   Total Bilirubin 0.3 - 1.2 mg/dL 0.8  1.2  0.9   Alkaline Phos 38 - 126 U/L 49  58  62   AST 15 - 41 U/L 15  13  15    ALT 0 - 44 U/L 17  20  17       No results found for: "CEA1", "CEA" / No results found for: "CEA1", "CEA" Lab Results  Component Value Date   PSA1 4.7 (H) 09/15/2021   No results found for: "WUJ811" No results found for: "CAN125"  No results found for: "TOTALPROTELP", "ALBUMINELP", "A1GS", "A2GS", "BETS", "BETA2SER", "GAMS", "MSPIKE", "SPEI" Lab Results  Component Value Date   TIBC 260 04/24/2023   TIBC 302 01/12/2023   TIBC 289 10/03/2022   FERRITIN 75 04/24/2023   FERRITIN 72 01/12/2023   FERRITIN 77 10/03/2022   IRONPCTSAT 27 04/24/2023   IRONPCTSAT 23 01/12/2023   IRONPCTSAT 22 10/03/2022   Lab Results  Component Value Date   LDH 125 04/24/2023   LDH 129 01/12/2023   LDH 130 10/03/2022     STUDIES:   US THYROID  Result Date: 04/29/2023 CLINICAL DATA:  77 year old male with a history of thyroid nodule EXAM: THYROID ULTRASOUND TECHNIQUE: Ultrasound examination of the thyroid gland and adjacent soft tissues was performed.  COMPARISON:  No prior ultrasound.  Prior PET 01/12/2023 FINDINGS: Parenchymal Echotexture: Moderately heterogenous Isthmus: 0.4 cm Right lobe: 3.7 cm x 2.1 cm x 1.7 cm Left lobe: 3.6 cm x 2.4 cm x 1.9 cm _________________________________________________________ Estimated total number of nodules >/= 1 cm: 2 Number of spongiform nodules >/=  2 cm not described below (TR1): 0 Number of mixed cystic and solid nodules >/= 1.5 cm not described below (TR2): 0 _________________________________________________________ Nodule labeled 1 in the isthmus, 7 mm, cystic and does not meet criteria for surveillance. Nodule labeled 2 in the superior right thyroid, 11 mm x 11 mm x 7 mm. Nodule has a pseudo nodule/TR 3 characteristics and does not meet criteria for surveillance. Nodule labeled 3 in the mid left thyroid, 9 mm x 9 mm x 7 mm. Nodule has TR 4 characteristic and does not meet criteria for surveillance. Nodule labeled 4 inferior left thyroid, 1.8 cm. Nodule has at least TR 4 characteristics and meets criteria for biopsy. No adenopathy IMPRESSION: Left inferior thyroid nodule (labeled 4, 1.8 cm) meets criteria for biopsy, as designated by the newly established ACR TI-RADS criteria, and referral for biopsy is recommended. Recommendations follow those established by the new ACR TI-RADS criteria (J Am Coll Radiol 2017;14:587-595). Electronically Signed   By: Gilmer Mor D.O.   On: 04/29/2023 06:03

## 2023-05-04 NOTE — Patient Instructions (Addendum)
[facility]  Discharge Instructions  You were seen and examined today by Dr. Ellin Saba.  Dr. Ellin Saba discussed your most recent lab work and Thyroid Ultrasound which revealed that there is a small nodule on the left side.   Dr. Ellin Saba is going to get you set up for a Thyroid biopsy.   Follow-up as scheduled.    Thank you for choosing [facility] to provide your oncology and hematology care.   To afford each patient quality time with our provider, please arrive at least 15 minutes before your scheduled appointment time. You may need to reschedule your appointment if you arrive late (10 or more minutes). Arriving late affects you and other patients whose appointments are after yours.  Also, if you miss three or more appointments without notifying the office, you may be dismissed from the clinic at the provider's discretion.    Again, thank you for choosing [facility].  Our hope is that these requests will decrease the amount of time that you wait before being seen by our physicians.   If you have a lab appointment with the Cancer Center - please note that after April 8th, all labs will be drawn in the cancer center.  You do not have to check in or register with the main entrance as you have in the past but will complete your check-in at the cancer center.            _____________________________________________________________  Should you have questions after your visit to [facility], please contact our office at [phone] and follow the prompts.  Our office hours are 8:00 a.m. to 4:30 p.m. Monday - Thursday and 8:00 a.m. to 2:30 p.m. Friday.  Please note that voicemails left after 4:00 p.m. may not be returned until the following business day.  We are closed weekends and all major holidays.  You do have access to a nurse 24-7, just call the main number to the clinic [phone] and do not press any  options, hold on the line and a nurse will answer the phone.    For prescription refill requests, have your pharmacy contact our office and allow 72 hours.    Masks are no longer required in the cancer centers. If you would like for your care team to wear a mask while they are taking care of you, please let them know. You may have one support person who is at least 76 years old accompany you for your appointments.

## 2023-05-16 DIAGNOSIS — I129 Hypertensive chronic kidney disease with stage 1 through stage 4 chronic kidney disease, or unspecified chronic kidney disease: Secondary | ICD-10-CM | POA: Diagnosis not present

## 2023-05-16 DIAGNOSIS — D631 Anemia in chronic kidney disease: Secondary | ICD-10-CM | POA: Diagnosis not present

## 2023-05-16 DIAGNOSIS — E211 Secondary hyperparathyroidism, not elsewhere classified: Secondary | ICD-10-CM | POA: Diagnosis not present

## 2023-05-16 DIAGNOSIS — R808 Other proteinuria: Secondary | ICD-10-CM | POA: Diagnosis not present

## 2023-05-18 ENCOUNTER — Ambulatory Visit (HOSPITAL_COMMUNITY): Payer: HMO

## 2023-05-19 ENCOUNTER — Ambulatory Visit (HOSPITAL_COMMUNITY)
Admission: RE | Admit: 2023-05-19 | Discharge: 2023-05-19 | Disposition: A | Discharge status: Home / Self Care | Payer: HMO | Source: Ambulatory Visit | Attending: Hematology | Admitting: Hematology

## 2023-05-19 ENCOUNTER — Encounter (HOSPITAL_COMMUNITY): Payer: Self-pay

## 2023-05-19 DIAGNOSIS — E041 Nontoxic single thyroid nodule: Secondary | ICD-10-CM | POA: Diagnosis not present

## 2023-05-19 DIAGNOSIS — C8300 Small cell B-cell lymphoma, unspecified site: Secondary | ICD-10-CM

## 2023-05-19 DIAGNOSIS — R591 Generalized enlarged lymph nodes: Secondary | ICD-10-CM

## 2023-05-19 MED ORDER — LIDOCAINE HCL (PF) 2 % IJ SOLN
10.0000 mL | Freq: Once | INTRAMUSCULAR | Status: AC
  Administered 2023-05-19: 10 mL

## 2023-05-23 ENCOUNTER — Other Ambulatory Visit: Payer: Self-pay

## 2023-05-23 LAB — CYTOLOGY - NON PAP

## 2023-05-23 NOTE — Progress Notes (Signed)
Specialty Pharmacy Refill Coordination Note  Kevin Hooper is a 77 y.o. male contacted today regarding refills of specialty medication(s) Zanubrutinib Spoke with patient's wife, Roddie Mc.  Patient requested Delivery   Delivery date: 05/26/23   Verified address: [address]  [place] [address]   Medication will be filled on 05/25/23.

## 2023-06-14 ENCOUNTER — Other Ambulatory Visit: Payer: Self-pay | Admitting: Hematology

## 2023-06-14 ENCOUNTER — Other Ambulatory Visit: Payer: Self-pay

## 2023-06-14 DIAGNOSIS — C8300 Small cell B-cell lymphoma, unspecified site: Secondary | ICD-10-CM

## 2023-06-14 NOTE — Progress Notes (Signed)
Specialty Pharmacy Ongoing Clinical Assessment Note  Kevin Hooper is a 77 y.o. male who is being followed by the specialty pharmacy service for RxSp Oncology   Patient's specialty medication(s) reviewed today: Zanubrutinib   Missed doses in the last 4 weeks: 0   Patient/Caregiver did not have any additional questions or concerns.   Therapeutic benefit summary: Patient is achieving benefit   Adverse events/side effects summary: No adverse events/side effects   Patient's therapy is appropriate to: Continue    Goals Addressed             This Visit's Progress    Slow Disease Progression       Patient is on track. Patient will maintain adherence. Per provider note from 05/04/23, June PET scan did not show progression and September labs were within normal limits.          Follow up:  6 months  Otto Herb Specialty Pharmacist

## 2023-06-14 NOTE — Progress Notes (Signed)
Specialty Pharmacy Refill Coordination Note  Kevin Hooper is a 77 y.o. male contacted today regarding refills of specialty medication(s) Zanubrutinib   Patient requested Delivery   Delivery date: 06/22/23   Verified address: [address]  [place] [address]   Medication will be filled on 06/21/23.

## 2023-06-15 ENCOUNTER — Other Ambulatory Visit: Payer: Self-pay

## 2023-06-15 MED ORDER — BRUKINSA 80 MG PO CAPS
160.0000 mg | ORAL_CAPSULE | Freq: Two times a day (BID) | ORAL | 1 refills | Status: DC
  Filled 2023-06-15: qty 120, 30d supply, fill #0
  Filled 2023-07-25: qty 120, 30d supply, fill #1

## 2023-06-26 DIAGNOSIS — Z299 Encounter for prophylactic measures, unspecified: Secondary | ICD-10-CM | POA: Diagnosis not present

## 2023-06-26 DIAGNOSIS — J439 Emphysema, unspecified: Secondary | ICD-10-CM | POA: Diagnosis not present

## 2023-06-26 DIAGNOSIS — E1169 Type 2 diabetes mellitus with other specified complication: Secondary | ICD-10-CM | POA: Diagnosis not present

## 2023-06-26 DIAGNOSIS — I10 Essential (primary) hypertension: Secondary | ICD-10-CM | POA: Diagnosis not present

## 2023-06-26 DIAGNOSIS — C9110 Chronic lymphocytic leukemia of B-cell type not having achieved remission: Secondary | ICD-10-CM | POA: Diagnosis not present

## 2023-07-11 ENCOUNTER — Other Ambulatory Visit: Payer: Self-pay

## 2023-07-25 ENCOUNTER — Other Ambulatory Visit: Payer: Self-pay

## 2023-07-25 NOTE — Progress Notes (Signed)
Specialty Pharmacy Refill Coordination Note  Kevin Hooper is a 76 y.o. male contacted today regarding refills of specialty medication(s) Zanubrutinib (Brukinsa)   Patient requested Delivery   Delivery date: 07/31/23   Verified address: [address]  [place] [id number]   Medication will be filled on 07/28/23.

## 2023-07-27 ENCOUNTER — Other Ambulatory Visit (HOSPITAL_COMMUNITY): Payer: Self-pay

## 2023-07-27 ENCOUNTER — Encounter (HOSPITAL_COMMUNITY): Payer: Self-pay | Admitting: Emergency Medicine

## 2023-07-27 ENCOUNTER — Emergency Department (HOSPITAL_COMMUNITY): Payer: HMO

## 2023-07-27 ENCOUNTER — Other Ambulatory Visit: Payer: Self-pay

## 2023-07-27 ENCOUNTER — Emergency Department (HOSPITAL_COMMUNITY)
Admission: EM | Admit: 2023-07-27 | Discharge: 2023-07-27 | Disposition: A | Discharge status: Home / Self Care | Payer: HMO | Attending: Emergency Medicine | Admitting: Emergency Medicine

## 2023-07-27 DIAGNOSIS — R109 Unspecified abdominal pain: Secondary | ICD-10-CM | POA: Diagnosis not present

## 2023-07-27 DIAGNOSIS — I129 Hypertensive chronic kidney disease with stage 1 through stage 4 chronic kidney disease, or unspecified chronic kidney disease: Secondary | ICD-10-CM | POA: Insufficient documentation

## 2023-07-27 DIAGNOSIS — Z79899 Other long term (current) drug therapy: Secondary | ICD-10-CM | POA: Diagnosis not present

## 2023-07-27 DIAGNOSIS — M8580 Other specified disorders of bone density and structure, unspecified site: Secondary | ICD-10-CM | POA: Diagnosis not present

## 2023-07-27 DIAGNOSIS — E1122 Type 2 diabetes mellitus with diabetic chronic kidney disease: Secondary | ICD-10-CM | POA: Insufficient documentation

## 2023-07-27 DIAGNOSIS — S060XAA Concussion with loss of consciousness status unknown, initial encounter: Secondary | ICD-10-CM | POA: Diagnosis not present

## 2023-07-27 DIAGNOSIS — S0003XA Contusion of scalp, initial encounter: Secondary | ICD-10-CM | POA: Diagnosis not present

## 2023-07-27 DIAGNOSIS — Y92410 Unspecified street and highway as the place of occurrence of the external cause: Secondary | ICD-10-CM | POA: Diagnosis not present

## 2023-07-27 DIAGNOSIS — S199XXA Unspecified injury of neck, initial encounter: Secondary | ICD-10-CM | POA: Diagnosis present

## 2023-07-27 DIAGNOSIS — M25519 Pain in unspecified shoulder: Secondary | ICD-10-CM | POA: Diagnosis not present

## 2023-07-27 DIAGNOSIS — M25512 Pain in left shoulder: Secondary | ICD-10-CM | POA: Diagnosis not present

## 2023-07-27 DIAGNOSIS — Z23 Encounter for immunization: Secondary | ICD-10-CM | POA: Diagnosis not present

## 2023-07-27 DIAGNOSIS — Z8572 Personal history of non-Hodgkin lymphomas: Secondary | ICD-10-CM | POA: Diagnosis not present

## 2023-07-27 DIAGNOSIS — M85812 Other specified disorders of bone density and structure, left shoulder: Secondary | ICD-10-CM | POA: Diagnosis not present

## 2023-07-27 DIAGNOSIS — R911 Solitary pulmonary nodule: Secondary | ICD-10-CM | POA: Insufficient documentation

## 2023-07-27 DIAGNOSIS — S22020A Wedge compression fracture of second thoracic vertebra, initial encounter for closed fracture: Secondary | ICD-10-CM | POA: Diagnosis not present

## 2023-07-27 DIAGNOSIS — N189 Chronic kidney disease, unspecified: Secondary | ICD-10-CM | POA: Insufficient documentation

## 2023-07-27 DIAGNOSIS — S299XXA Unspecified injury of thorax, initial encounter: Secondary | ICD-10-CM | POA: Diagnosis not present

## 2023-07-27 DIAGNOSIS — M542 Cervicalgia: Secondary | ICD-10-CM | POA: Diagnosis not present

## 2023-07-27 DIAGNOSIS — R590 Localized enlarged lymph nodes: Secondary | ICD-10-CM | POA: Diagnosis not present

## 2023-07-27 DIAGNOSIS — S161XXA Strain of muscle, fascia and tendon at neck level, initial encounter: Secondary | ICD-10-CM | POA: Insufficient documentation

## 2023-07-27 DIAGNOSIS — I10 Essential (primary) hypertension: Secondary | ICD-10-CM | POA: Diagnosis not present

## 2023-07-27 DIAGNOSIS — M19012 Primary osteoarthritis, left shoulder: Secondary | ICD-10-CM | POA: Diagnosis not present

## 2023-07-27 DIAGNOSIS — R079 Chest pain, unspecified: Secondary | ICD-10-CM | POA: Diagnosis not present

## 2023-07-27 DIAGNOSIS — M25559 Pain in unspecified hip: Secondary | ICD-10-CM | POA: Diagnosis not present

## 2023-07-27 DIAGNOSIS — S3993XA Unspecified injury of pelvis, initial encounter: Secondary | ICD-10-CM | POA: Diagnosis not present

## 2023-07-27 DIAGNOSIS — I700 Atherosclerosis of aorta: Secondary | ICD-10-CM | POA: Diagnosis not present

## 2023-07-27 LAB — CBC WITH DIFFERENTIAL/PLATELET
Abs Immature Granulocytes: 0.06 10*3/uL (ref 0.00–0.07)
Basophils Absolute: 0 10*3/uL (ref 0.0–0.1)
Basophils Relative: 0 %
Eosinophils Absolute: 0.2 10*3/uL (ref 0.0–0.5)
Eosinophils Relative: 2 %
HCT: 39.5 % (ref 39.0–52.0)
Hemoglobin: 13.4 g/dL (ref 13.0–17.0)
Immature Granulocytes: 1 %
Lymphocytes Relative: 13 %
Lymphs Abs: 1 10*3/uL (ref 0.7–4.0)
MCH: 30.9 pg (ref 26.0–34.0)
MCHC: 33.9 g/dL (ref 30.0–36.0)
MCV: 91.2 fL (ref 80.0–100.0)
Monocytes Absolute: 0.5 10*3/uL (ref 0.1–1.0)
Monocytes Relative: 6 %
Neutro Abs: 6 10*3/uL (ref 1.7–7.7)
Neutrophils Relative %: 78 %
Platelets: 201 10*3/uL (ref 150–400)
RBC: 4.33 MIL/uL (ref 4.22–5.81)
RDW: 12.5 % (ref 11.5–15.5)
WBC: 7.8 10*3/uL (ref 4.0–10.5)
nRBC: 0 % (ref 0.0–0.2)

## 2023-07-27 LAB — COMPREHENSIVE METABOLIC PANEL
ALT: 19 U/L (ref 0–44)
AST: 23 U/L (ref 15–41)
Albumin: 3.6 g/dL (ref 3.5–5.0)
Alkaline Phosphatase: 57 U/L (ref 38–126)
Anion gap: 6 (ref 5–15)
BUN: 31 mg/dL — ABNORMAL HIGH (ref 8–23)
CO2: 25 mmol/L (ref 22–32)
Calcium: 8.7 mg/dL — ABNORMAL LOW (ref 8.9–10.3)
Chloride: 104 mmol/L (ref 98–111)
Creatinine, Ser: 2.28 mg/dL — ABNORMAL HIGH (ref 0.61–1.24)
GFR, Estimated: 29 mL/min — ABNORMAL LOW (ref >60)
Glucose, Bld: 186 mg/dL — ABNORMAL HIGH (ref 70–99)
Potassium: 4.7 mmol/L (ref 3.5–5.1)
Sodium: 135 mmol/L (ref 135–145)
Total Bilirubin: 1 mg/dL (ref <1.2)
Total Protein: 7.2 g/dL (ref 6.5–8.1)

## 2023-07-27 MED ORDER — LIDOCAINE-EPINEPHRINE-TETRACAINE (LET) TOPICAL GEL
3.0000 mL | Freq: Once | TOPICAL | Status: AC
  Administered 2023-07-27: 3 mL via TOPICAL
  Filled 2023-07-27: qty 3

## 2023-07-27 MED ORDER — HYDROCODONE-ACETAMINOPHEN 5-325 MG PO TABS
1.0000 | ORAL_TABLET | Freq: Once | ORAL | Status: AC
  Administered 2023-07-27: 1 via ORAL
  Filled 2023-07-27: qty 1

## 2023-07-27 MED ORDER — LIDOCAINE 5 % EX PTCH
1.0000 | MEDICATED_PATCH | CUTANEOUS | Status: DC
  Administered 2023-07-27: 1 via TRANSDERMAL
  Filled 2023-07-27: qty 1

## 2023-07-27 MED ORDER — TETANUS-DIPHTH-ACELL PERTUSSIS 5-2.5-18.5 LF-MCG/0.5 IM SUSY
0.5000 mL | PREFILLED_SYRINGE | Freq: Once | INTRAMUSCULAR | Status: AC
  Administered 2023-07-27: 0.5 mL via INTRAMUSCULAR
  Filled 2023-07-27: qty 0.5

## 2023-07-27 MED ORDER — DICLOFENAC SODIUM 1 % EX GEL: 4.0000 g | Freq: Four times a day (QID) | CUTANEOUS | 0 refills | Status: DC

## 2023-07-27 MED ORDER — DICLOFENAC SODIUM 1 % EX GEL
4.0000 g | Freq: Four times a day (QID) | CUTANEOUS | 0 refills | Status: DC
  Filled 2023-07-27: qty 100, 8d supply, fill #0

## 2023-07-27 MED ORDER — HYDROCODONE-ACETAMINOPHEN 5-325 MG PO TABS: 1.0000 | ORAL_TABLET | Freq: Four times a day (QID) | ORAL | 0 refills | Status: DC | PRN

## 2023-07-27 MED ORDER — IBUPROFEN 400 MG PO TABS
600.0000 mg | ORAL_TABLET | Freq: Once | ORAL | Status: AC
  Administered 2023-07-27: 600 mg via ORAL
  Filled 2023-07-27: qty 2

## 2023-07-27 MED ORDER — HYDROCODONE-ACETAMINOPHEN 5-325 MG PO TABS
1.0000 | ORAL_TABLET | Freq: Four times a day (QID) | ORAL | 0 refills | Status: DC | PRN
  Filled 2023-07-27: qty 8, 2d supply, fill #0

## 2023-07-27 MED ORDER — FENTANYL CITRATE PF 50 MCG/ML IJ SOSY
50.0000 ug | PREFILLED_SYRINGE | Freq: Once | INTRAMUSCULAR | Status: AC
  Administered 2023-07-27: 50 ug via INTRAVENOUS
  Filled 2023-07-27: qty 1

## 2023-07-27 NOTE — ED Notes (Signed)
Pt called out to go to restroom, and refused the urinal as he said he would walk even though he had on the c-collar.
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
## 2023-07-27 NOTE — ED Notes (Signed)
Doctor, hospital in patient room at this time.

## 2023-07-28 ENCOUNTER — Other Ambulatory Visit: Payer: Self-pay

## 2023-08-03 ENCOUNTER — Inpatient Hospital Stay: Payer: HMO

## 2023-08-03 ENCOUNTER — Inpatient Hospital Stay: Payer: HMO | Attending: Hematology

## 2023-08-03 DIAGNOSIS — N183 Chronic kidney disease, stage 3 unspecified: Secondary | ICD-10-CM | POA: Insufficient documentation

## 2023-08-03 DIAGNOSIS — E1122 Type 2 diabetes mellitus with diabetic chronic kidney disease: Secondary | ICD-10-CM | POA: Diagnosis not present

## 2023-08-03 DIAGNOSIS — Z808 Family history of malignant neoplasm of other organs or systems: Secondary | ICD-10-CM | POA: Insufficient documentation

## 2023-08-03 DIAGNOSIS — Z87891 Personal history of nicotine dependence: Secondary | ICD-10-CM | POA: Diagnosis not present

## 2023-08-03 DIAGNOSIS — R591 Generalized enlarged lymph nodes: Secondary | ICD-10-CM

## 2023-08-03 DIAGNOSIS — M8580 Other specified disorders of bone density and structure, unspecified site: Secondary | ICD-10-CM | POA: Insufficient documentation

## 2023-08-03 DIAGNOSIS — E041 Nontoxic single thyroid nodule: Secondary | ICD-10-CM | POA: Diagnosis not present

## 2023-08-03 DIAGNOSIS — I129 Hypertensive chronic kidney disease with stage 1 through stage 4 chronic kidney disease, or unspecified chronic kidney disease: Secondary | ICD-10-CM | POA: Insufficient documentation

## 2023-08-03 DIAGNOSIS — C8305 Small cell B-cell lymphoma, lymph nodes of inguinal region and lower limb: Secondary | ICD-10-CM | POA: Insufficient documentation

## 2023-08-03 DIAGNOSIS — Z79899 Other long term (current) drug therapy: Secondary | ICD-10-CM | POA: Insufficient documentation

## 2023-08-03 DIAGNOSIS — D631 Anemia in chronic kidney disease: Secondary | ICD-10-CM | POA: Diagnosis not present

## 2023-08-03 DIAGNOSIS — C8300 Small cell B-cell lymphoma, unspecified site: Secondary | ICD-10-CM

## 2023-08-03 LAB — COMPREHENSIVE METABOLIC PANEL
ALT: 34 U/L (ref 0–44)
AST: 23 U/L (ref 15–41)
Albumin: 3.2 g/dL — ABNORMAL LOW (ref 3.5–5.0)
Alkaline Phosphatase: 53 U/L (ref 38–126)
Anion gap: 9 (ref 5–15)
BUN: 33 mg/dL — ABNORMAL HIGH (ref 8–23)
CO2: 26 mmol/L (ref 22–32)
Calcium: 8.7 mg/dL — ABNORMAL LOW (ref 8.9–10.3)
Chloride: 98 mmol/L (ref 98–111)
Creatinine, Ser: 2.38 mg/dL — ABNORMAL HIGH (ref 0.61–1.24)
GFR, Estimated: 28 mL/min — ABNORMAL LOW (ref >60)
Glucose, Bld: 186 mg/dL — ABNORMAL HIGH (ref 70–99)
Potassium: 4.3 mmol/L (ref 3.5–5.1)
Sodium: 133 mmol/L — ABNORMAL LOW (ref 135–145)
Total Bilirubin: 0.8 mg/dL (ref 0.0–1.2)
Total Protein: 7 g/dL (ref 6.5–8.1)

## 2023-08-03 LAB — CBC WITH DIFFERENTIAL/PLATELET
Abs Immature Granulocytes: 0.09 10*3/uL — ABNORMAL HIGH (ref 0.00–0.07)
Basophils Absolute: 0.1 10*3/uL (ref 0.0–0.1)
Basophils Relative: 1 %
Eosinophils Absolute: 0.3 10*3/uL (ref 0.0–0.5)
Eosinophils Relative: 4 %
HCT: 34 % — ABNORMAL LOW (ref 39.0–52.0)
Hemoglobin: 11.3 g/dL — ABNORMAL LOW (ref 13.0–17.0)
Immature Granulocytes: 1 %
Lymphocytes Relative: 27 %
Lymphs Abs: 1.8 10*3/uL (ref 0.7–4.0)
MCH: 30.7 pg (ref 26.0–34.0)
MCHC: 33.2 g/dL (ref 30.0–36.0)
MCV: 92.4 fL (ref 80.0–100.0)
Monocytes Absolute: 0.6 10*3/uL (ref 0.1–1.0)
Monocytes Relative: 8 %
Neutro Abs: 4.1 10*3/uL (ref 1.7–7.7)
Neutrophils Relative %: 59 %
Platelets: 232 10*3/uL (ref 150–400)
RBC: 3.68 MIL/uL — ABNORMAL LOW (ref 4.22–5.81)
RDW: 12.8 % (ref 11.5–15.5)
WBC: 6.9 10*3/uL (ref 4.0–10.5)
nRBC: 0 % (ref 0.0–0.2)

## 2023-08-03 LAB — LACTATE DEHYDROGENASE: LDH: 147 U/L (ref 98–192)

## 2023-08-03 LAB — FERRITIN: Ferritin: 142 ng/mL (ref 24–336)

## 2023-08-03 LAB — IRON AND TIBC
Iron: 81 ug/dL (ref 45–182)
Saturation Ratios: 30 % (ref 17.9–39.5)
TIBC: 274 ug/dL (ref 250–450)
UIBC: 193 ug/dL

## 2023-08-06 ENCOUNTER — Telehealth: Payer: Self-pay

## 2023-08-06 ENCOUNTER — Other Ambulatory Visit (HOSPITAL_COMMUNITY): Payer: Self-pay

## 2023-08-06 NOTE — Telephone Encounter (Signed)
 Oral Oncology Patient Advocate Encounter  Was successful in securing patient a $8,000.00 grant from [facility] to provide copayment coverage for Brukinsa .  This will keep the out of pocket expense at $0.     [employer] ID: [id number]   The billing information is as follows and has been shared with [facility].    RxBin: [id number] PCN: [id number] Member ID: [id number] Group ID: [id number] Dates of Eligibility: 07/12/23 through 07/10/24  Fund:  Chronic Lymphocytic Leukemia   Morene Potters, CPhT Oncology Pharmacy Patient Advocate  [facility]  [phone] (phone) [phone] (fax)

## 2023-08-10 ENCOUNTER — Inpatient Hospital Stay (HOSPITAL_BASED_OUTPATIENT_CLINIC_OR_DEPARTMENT_OTHER): Payer: HMO | Admitting: Hematology

## 2023-08-10 VITALS — BP 156/94 | HR 75 | Temp 98.0°F | Resp 18 | Wt 188.9 lb

## 2023-08-10 DIAGNOSIS — C8300 Small cell B-cell lymphoma, unspecified site: Secondary | ICD-10-CM

## 2023-08-10 DIAGNOSIS — N189 Chronic kidney disease, unspecified: Secondary | ICD-10-CM | POA: Diagnosis not present

## 2023-08-10 DIAGNOSIS — C8305 Small cell B-cell lymphoma, lymph nodes of inguinal region and lower limb: Secondary | ICD-10-CM | POA: Diagnosis not present

## 2023-08-10 DIAGNOSIS — D631 Anemia in chronic kidney disease: Secondary | ICD-10-CM
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
## 2023-08-10 NOTE — Patient Instructions (Signed)
[facility] at [facility] Discharge Instructions   You were seen and examined today by Dr. Ellin Saba.  He reviewed the results of your lab work which are normal/stable.   Continue Brukinsa as prescribed.   Return as scheduled.    Thank you for choosing [facility] at [facility] to provide your oncology and hematology care.  To afford each patient quality time with our provider, please arrive at least 15 minutes before your scheduled appointment time.   If you have a lab appointment with the Cancer Center please come in thru the Main Entrance and check in at the main information desk.  You need to re-schedule your appointment should you arrive 10 or more minutes late.  We strive to give you quality time with our providers, and arriving late affects you and other patients whose appointments are after yours.  Also, if you no show three or more times for appointments you may be dismissed from the clinic at the providers discretion.     Again, thank you for choosing [facility].  Our hope is that these requests will decrease the amount of time that you wait before being seen by our physicians.       _____________________________________________________________  Should you have questions after your visit to [facility], please contact our office at [phone] and follow the prompts.  Our office hours are 8:00 a.m. and 4:30 p.m. Monday - Friday.  Please note that voicemails left after 4:00 p.m. may not be returned until the following business day.  We are closed weekends and major holidays.  You do have access to a nurse 24-7, just call the main number to the clinic [phone] and do not press any options, hold on the line and a nurse will answer the phone.    For prescription refill requests, have your pharmacy contact our office and allow 72 hours.    Due to Covid, you will need to wear a mask upon entering the  hospital. If you do not have a mask, a mask will be given to you at the Main Entrance upon arrival. For doctor visits, patients may have 1 support person age 81 or older with them. For treatment visits, patients can not have anyone with them due to social distancing guidelines and our immunocompromised population.

## 2023-08-10 NOTE — Progress Notes (Signed)
Patient is taking Brukinsa as prescribed.  He has not missed any doses and reports no side effects at this time.   

## 2023-08-16 DIAGNOSIS — R809 Proteinuria, unspecified: Secondary | ICD-10-CM | POA: Diagnosis not present

## 2023-08-16 DIAGNOSIS — N189 Chronic kidney disease, unspecified: Secondary | ICD-10-CM | POA: Diagnosis not present

## 2023-08-16 DIAGNOSIS — D631 Anemia in chronic kidney disease: Secondary | ICD-10-CM | POA: Diagnosis not present

## 2023-08-22 ENCOUNTER — Other Ambulatory Visit (HOSPITAL_COMMUNITY): Payer: Self-pay

## 2023-08-22 ENCOUNTER — Other Ambulatory Visit: Payer: Self-pay | Admitting: Hematology

## 2023-08-22 ENCOUNTER — Other Ambulatory Visit: Payer: Self-pay

## 2023-08-22 DIAGNOSIS — C8300 Small cell B-cell lymphoma, unspecified site: Secondary | ICD-10-CM

## 2023-08-22 MED ORDER — BRUKINSA 80 MG PO CAPS
160.0000 mg | ORAL_CAPSULE | Freq: Two times a day (BID) | ORAL | 1 refills | Status: DC
  Filled 2023-08-22: qty 120, 30d supply, fill #0
  Filled 2023-09-21: qty 120, 30d supply, fill #1

## 2023-08-22 NOTE — Progress Notes (Signed)
Specialty Pharmacy Refill Coordination Note  Kevin Hooper is a 78 y.o. male contacted today regarding refills of specialty medication(s) Zanubrutinib (Brukinsa)   Patient requested Delivery   Delivery date: 08/31/23   Verified address: [address] [place] [address]   Medication will be filled on 08/30/23 *Pending refill request.

## 2023-08-23 DIAGNOSIS — E211 Secondary hyperparathyroidism, not elsewhere classified: Secondary | ICD-10-CM | POA: Diagnosis not present

## 2023-08-23 DIAGNOSIS — C8590 Non-Hodgkin lymphoma, unspecified, unspecified site: Secondary | ICD-10-CM | POA: Diagnosis not present

## 2023-08-23 DIAGNOSIS — D631 Anemia in chronic kidney disease: Secondary | ICD-10-CM | POA: Diagnosis not present

## 2023-08-23 DIAGNOSIS — R808 Other proteinuria: Secondary | ICD-10-CM | POA: Diagnosis not present

## 2023-08-24 ENCOUNTER — Other Ambulatory Visit: Payer: Self-pay

## 2023-08-30 ENCOUNTER — Other Ambulatory Visit: Payer: Self-pay

## 2023-08-30 ENCOUNTER — Other Ambulatory Visit (HOSPITAL_COMMUNITY): Payer: Self-pay

## 2023-09-18 DIAGNOSIS — I872 Venous insufficiency (chronic) (peripheral): Secondary | ICD-10-CM | POA: Diagnosis not present

## 2023-09-18 DIAGNOSIS — L308 Other specified dermatitis: Secondary | ICD-10-CM | POA: Diagnosis not present

## 2023-09-21 ENCOUNTER — Other Ambulatory Visit: Payer: Self-pay

## 2023-09-21 NOTE — Progress Notes (Signed)
Specialty Pharmacy Refill Coordination Note  Kevin Hooper is a 78 y.o. male contacted today regarding refills of specialty medication(s) Zanubrutinib (Brukinsa) Spoke with patient's wife  Patient requested Delivery   Delivery date: 09/29/23   Verified address: [address] [place] [address]   Medication will be filled on 02.27.25.

## 2023-09-26 DIAGNOSIS — Z299 Encounter for prophylactic measures, unspecified: Secondary | ICD-10-CM | POA: Diagnosis not present

## 2023-09-26 DIAGNOSIS — J449 Chronic obstructive pulmonary disease, unspecified: Secondary | ICD-10-CM | POA: Diagnosis not present

## 2023-09-26 DIAGNOSIS — I10 Essential (primary) hypertension: Secondary | ICD-10-CM | POA: Diagnosis not present

## 2023-09-26 DIAGNOSIS — E1169 Type 2 diabetes mellitus with other specified complication: Secondary | ICD-10-CM | POA: Diagnosis not present

## 2023-09-26 DIAGNOSIS — R52 Pain, unspecified: Secondary | ICD-10-CM | POA: Diagnosis not present

## 2023-09-26 DIAGNOSIS — C9110 Chronic lymphocytic leukemia of B-cell type not having achieved remission: Secondary | ICD-10-CM | POA: Diagnosis not present

## 2023-09-28 ENCOUNTER — Other Ambulatory Visit: Payer: Self-pay

## 2023-10-02 DIAGNOSIS — E119 Type 2 diabetes mellitus without complications: Secondary | ICD-10-CM | POA: Diagnosis not present

## 2023-10-03 DIAGNOSIS — I10 Essential (primary) hypertension: Secondary | ICD-10-CM | POA: Diagnosis not present

## 2023-10-03 DIAGNOSIS — N183 Chronic kidney disease, stage 3 unspecified: Secondary | ICD-10-CM | POA: Diagnosis not present

## 2023-10-03 DIAGNOSIS — E1169 Type 2 diabetes mellitus with other specified complication: Secondary | ICD-10-CM | POA: Diagnosis not present

## 2023-10-03 DIAGNOSIS — Z299 Encounter for prophylactic measures, unspecified: Secondary | ICD-10-CM | POA: Diagnosis not present

## 2023-10-03 DIAGNOSIS — J439 Emphysema, unspecified: Secondary | ICD-10-CM | POA: Diagnosis not present

## 2023-10-03 DIAGNOSIS — E1122 Type 2 diabetes mellitus with diabetic chronic kidney disease: Secondary | ICD-10-CM | POA: Diagnosis not present

## 2023-10-24 ENCOUNTER — Other Ambulatory Visit (HOSPITAL_COMMUNITY): Payer: Self-pay

## 2023-10-24 ENCOUNTER — Other Ambulatory Visit: Payer: Self-pay | Admitting: Hematology

## 2023-10-24 ENCOUNTER — Other Ambulatory Visit: Payer: Self-pay

## 2023-10-24 DIAGNOSIS — C8300 Small cell B-cell lymphoma, unspecified site: Secondary | ICD-10-CM

## 2023-10-24 MED ORDER — BRUKINSA 80 MG PO CAPS
160.0000 mg | ORAL_CAPSULE | Freq: Two times a day (BID) | ORAL | 1 refills | Status: DC
Start: 2023-10-24 — End: 2023-12-19
  Filled 2023-10-25: qty 120, 30d supply, fill #0
  Filled 2023-11-16 (×2): qty 120, 30d supply, fill #1

## 2023-10-24 NOTE — Progress Notes (Signed)
 Specialty Pharmacy Refill Coordination Note  Kevin Hooper is a 78 y.o. male contacted today regarding refills of specialty medication(s) Zanubrutinib (Brukinsa)   Patient requested (Patient-Rptd) Delivery   Delivery date: (Patient-Rptd) 10/30/23   Verified address: (Patient-Rptd) [address] [place] [address]   Medication will be filled on 10/27/23. This fill date is pending response to refill request from provider. Patient is aware and if they have not received fill by intended date they must follow up with pharmacy.

## 2023-10-25 ENCOUNTER — Other Ambulatory Visit: Payer: Self-pay

## 2023-10-27 ENCOUNTER — Other Ambulatory Visit: Payer: Self-pay

## 2023-11-02 ENCOUNTER — Inpatient Hospital Stay: Payer: HMO | Attending: Hematology

## 2023-11-02 DIAGNOSIS — D649 Anemia, unspecified: Secondary | ICD-10-CM | POA: Insufficient documentation

## 2023-11-02 DIAGNOSIS — E041 Nontoxic single thyroid nodule: Secondary | ICD-10-CM | POA: Diagnosis not present

## 2023-11-02 DIAGNOSIS — N189 Chronic kidney disease, unspecified: Secondary | ICD-10-CM | POA: Diagnosis not present

## 2023-11-02 DIAGNOSIS — I10 Essential (primary) hypertension: Secondary | ICD-10-CM | POA: Diagnosis not present

## 2023-11-02 DIAGNOSIS — C8305 Small cell B-cell lymphoma, lymph nodes of inguinal region and lower limb: Secondary | ICD-10-CM | POA: Insufficient documentation

## 2023-11-02 DIAGNOSIS — C8300 Small cell B-cell lymphoma, unspecified site: Secondary | ICD-10-CM

## 2023-11-02 DIAGNOSIS — D631 Anemia in chronic kidney disease: Secondary | ICD-10-CM

## 2023-11-02 LAB — CBC WITH DIFFERENTIAL/PLATELET
Abs Immature Granulocytes: 0.03 10*3/uL (ref 0.00–0.07)
Basophils Absolute: 0.1 10*3/uL (ref 0.0–0.1)
Basophils Relative: 1 %
Eosinophils Absolute: 0.3 10*3/uL (ref 0.0–0.5)
Eosinophils Relative: 4 %
HCT: 36.6 % — ABNORMAL LOW (ref 39.0–52.0)
Hemoglobin: 12.1 g/dL — ABNORMAL LOW (ref 13.0–17.0)
Immature Granulocytes: 1 %
Lymphocytes Relative: 22 %
Lymphs Abs: 1.3 10*3/uL (ref 0.7–4.0)
MCH: 30.3 pg (ref 26.0–34.0)
MCHC: 33.1 g/dL (ref 30.0–36.0)
MCV: 91.7 fL (ref 80.0–100.0)
Monocytes Absolute: 0.6 10*3/uL (ref 0.1–1.0)
Monocytes Relative: 10 %
Neutro Abs: 3.7 10*3/uL (ref 1.7–7.7)
Neutrophils Relative %: 62 %
Platelets: 235 10*3/uL (ref 150–400)
RBC: 3.99 MIL/uL — ABNORMAL LOW (ref 4.22–5.81)
RDW: 13.6 % (ref 11.5–15.5)
WBC: 5.9 10*3/uL (ref 4.0–10.5)
nRBC: 0 % (ref 0.0–0.2)

## 2023-11-02 LAB — COMPREHENSIVE METABOLIC PANEL WITH GFR
ALT: 13 U/L (ref 0–44)
AST: 13 U/L — ABNORMAL LOW (ref 15–41)
Albumin: 3.3 g/dL — ABNORMAL LOW (ref 3.5–5.0)
Alkaline Phosphatase: 54 U/L (ref 38–126)
Anion gap: 7 (ref 5–15)
BUN: 38 mg/dL — ABNORMAL HIGH (ref 8–23)
CO2: 27 mmol/L (ref 22–32)
Calcium: 8.8 mg/dL — ABNORMAL LOW (ref 8.9–10.3)
Chloride: 102 mmol/L (ref 98–111)
Creatinine, Ser: 2.23 mg/dL — ABNORMAL HIGH (ref 0.61–1.24)
GFR, Estimated: 30 mL/min — ABNORMAL LOW (ref >60)
Glucose, Bld: 107 mg/dL — ABNORMAL HIGH (ref 70–99)
Potassium: 4.3 mmol/L (ref 3.5–5.1)
Sodium: 136 mmol/L (ref 135–145)
Total Bilirubin: 0.7 mg/dL (ref 0.0–1.2)
Total Protein: 7 g/dL (ref 6.5–8.1)

## 2023-11-02 LAB — IRON AND TIBC
Iron: 59 ug/dL (ref 45–182)
Saturation Ratios: 21 % (ref 17.9–39.5)
TIBC: 288 ug/dL (ref 250–450)
UIBC: 229 ug/dL

## 2023-11-02 LAB — LACTATE DEHYDROGENASE: LDH: 130 U/L (ref 98–192)

## 2023-11-02 LAB — FERRITIN: Ferritin: 74 ng/mL (ref 24–336)

## 2023-11-09 ENCOUNTER — Inpatient Hospital Stay: Payer: HMO | Admitting: Hematology

## 2023-11-09 VITALS — BP 155/81 | HR 56 | Temp 98.7°F | Resp 19 | Ht 68.5 in | Wt 182.6 lb

## 2023-11-09 DIAGNOSIS — C8300 Small cell B-cell lymphoma, unspecified site: Secondary | ICD-10-CM

## 2023-11-09 DIAGNOSIS — R911 Solitary pulmonary nodule: Secondary | ICD-10-CM | POA: Diagnosis not present

## 2023-11-09 DIAGNOSIS — D631 Anemia in chronic kidney disease: Secondary | ICD-10-CM

## 2023-11-09 DIAGNOSIS — N189 Chronic kidney disease, unspecified: Secondary | ICD-10-CM

## 2023-11-09 DIAGNOSIS — R809 Proteinuria, unspecified: Secondary | ICD-10-CM | POA: Diagnosis not present

## 2023-11-09 DIAGNOSIS — C8305 Small cell B-cell lymphoma, lymph nodes of inguinal region and lower limb: Secondary | ICD-10-CM | POA: Diagnosis not present

## 2023-11-09 DIAGNOSIS — E211 Secondary hyperparathyroidism, not elsewhere classified: Secondary | ICD-10-CM | POA: Diagnosis not present

## 2023-11-09 NOTE — Progress Notes (Signed)
Patient is taking Brukinsa as prescribed.  He has not missed any doses and reports no side effects at this time.   

## 2023-11-09 NOTE — Patient Instructions (Addendum)
 [facility] at [facility] Discharge Instructions   You were seen and examined today by Dr. Ellin Saba.  He reviewed the results of your lab work which are normal/stable.   Continue Brukinsa as prescribed.   We will see you back in 3 months.  We will repeat a CT of the chest and lab work prior to this visit.   Return as scheduled.    Thank you for choosing [facility] at [facility] to provide your oncology and hematology care.  To afford each patient quality time with our provider, please arrive at least 15 minutes before your scheduled appointment time.   If you have a lab appointment with the Cancer Center please come in thru the Main Entrance and check in at the main information desk.  You need to re-schedule your appointment should you arrive 10 or more minutes late.  We strive to give you quality time with our providers, and arriving late affects you and other patients whose appointments are after yours.  Also, if you no show three or more times for appointments you may be dismissed from the clinic at the providers discretion.     Again, thank you for choosing [facility].  Our hope is that these requests will decrease the amount of time that you wait before being seen by our physicians.       _____________________________________________________________  Should you have questions after your visit to [facility], please contact our office at [phone] and follow the prompts.  Our office hours are 8:00 a.m. and 4:30 p.m. Monday - Friday.  Please note that voicemails left after 4:00 p.m. may not be returned until the following business day.  We are closed weekends and major holidays.  You do have access to a nurse 24-7, just call the main number to the clinic [phone] and do not press any options, hold on the line and a nurse will answer the phone.    For prescription refill requests, have your pharmacy  contact our office and allow 72 hours.    Due to Covid, you will need to wear a mask upon entering the hospital. If you do not have a mask, a mask will be given to you at the Main Entrance upon arrival. For doctor visits, patients may have 1 support person age 69 or older with them. For treatment visits, patients can not have anyone with them due to social distancing guidelines and our immunocompromised population.
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
## 2023-11-15 DIAGNOSIS — H43391 Other vitreous opacities, right eye: Secondary | ICD-10-CM | POA: Diagnosis not present

## 2023-11-16 ENCOUNTER — Other Ambulatory Visit: Payer: Self-pay

## 2023-11-16 ENCOUNTER — Other Ambulatory Visit (HOSPITAL_COMMUNITY): Payer: Self-pay

## 2023-11-16 DIAGNOSIS — N1832 Chronic kidney disease, stage 3b: Secondary | ICD-10-CM | POA: Diagnosis not present

## 2023-11-16 DIAGNOSIS — I129 Hypertensive chronic kidney disease with stage 1 through stage 4 chronic kidney disease, or unspecified chronic kidney disease: Secondary | ICD-10-CM | POA: Diagnosis not present

## 2023-11-16 DIAGNOSIS — N139 Obstructive and reflux uropathy, unspecified: Secondary | ICD-10-CM | POA: Diagnosis not present

## 2023-11-16 DIAGNOSIS — N049 Nephrotic syndrome with unspecified morphologic changes: Secondary | ICD-10-CM | POA: Diagnosis not present

## 2023-11-16 NOTE — Progress Notes (Signed)
 Specialty Pharmacy Refill Coordination Note  Kevin Hooper is a 78 y.o. male contacted today regarding refills of specialty medication(s) Brukinsa.  Patient requested (Patient-Rptd) Delivery   Delivery date: (Patient-Rptd) 11/23/23   Verified address: (Patient-Rptd) [address] [place] [address]   Medication will be filled on 11/22/23.

## 2023-11-22 ENCOUNTER — Other Ambulatory Visit: Payer: Self-pay

## 2023-11-24 DIAGNOSIS — H33322 Round hole, left eye: Secondary | ICD-10-CM | POA: Diagnosis not present

## 2023-12-01 DIAGNOSIS — Z299 Encounter for prophylactic measures, unspecified: Secondary | ICD-10-CM | POA: Diagnosis not present

## 2023-12-01 DIAGNOSIS — N183 Chronic kidney disease, stage 3 unspecified: Secondary | ICD-10-CM | POA: Diagnosis not present

## 2023-12-01 DIAGNOSIS — I10 Essential (primary) hypertension: Secondary | ICD-10-CM | POA: Diagnosis not present

## 2023-12-01 DIAGNOSIS — Z0000 Encounter for general adult medical examination without abnormal findings: Secondary | ICD-10-CM | POA: Diagnosis not present

## 2023-12-01 DIAGNOSIS — E1122 Type 2 diabetes mellitus with diabetic chronic kidney disease: Secondary | ICD-10-CM | POA: Diagnosis not present

## 2023-12-01 DIAGNOSIS — N2581 Secondary hyperparathyroidism of renal origin: Secondary | ICD-10-CM | POA: Diagnosis not present

## 2023-12-19 ENCOUNTER — Other Ambulatory Visit: Payer: Self-pay | Admitting: Hematology

## 2023-12-19 ENCOUNTER — Other Ambulatory Visit: Payer: Self-pay

## 2023-12-19 ENCOUNTER — Other Ambulatory Visit (HOSPITAL_COMMUNITY): Payer: Self-pay

## 2023-12-19 ENCOUNTER — Other Ambulatory Visit: Payer: Self-pay | Admitting: *Deleted

## 2023-12-19 DIAGNOSIS — C8300 Small cell B-cell lymphoma, unspecified site: Secondary | ICD-10-CM

## 2023-12-19 MED ORDER — BRUKINSA 80 MG PO CAPS
160.0000 mg | ORAL_CAPSULE | Freq: Two times a day (BID) | ORAL | 1 refills | Status: DC
Start: 2023-12-19 — End: 2024-02-14
  Filled 2023-12-19: qty 120, 30d supply, fill #0
  Filled 2024-01-12 – 2024-01-15 (×2): qty 120, 30d supply, fill #1

## 2023-12-19 NOTE — Progress Notes (Signed)
 Specialty Pharmacy Refill Coordination Note  I spoke with the patient's wife. Kevin Hooper is a 78 y.o. male contacted today regarding refills of specialty medication(s) Zanubrutinib  (Brukinsa )   Patient requested Delivery   Delivery date: 12/22/23   Verified address: [address] [place] [address]   Medication will be filled on 12/21/23. This fill date is pending response to refill request from provider. Patient is aware and if they have not received fill by intended date they must follow up with pharmacy.

## 2023-12-19 NOTE — Telephone Encounter (Signed)
 Zanubrutinib  refill approved.  Patient tolerating and is to continue therapy at this time.

## 2023-12-19 NOTE — Progress Notes (Signed)
 Specialty Pharmacy Ongoing Clinical Assessment Note  I spoke with the patient's wife. Kevin Hooper is a 78 y.o. male who is being followed by the specialty pharmacy service for RxSp Oncology   Patient's specialty medication(s) reviewed today: Zanubrutinib  (Brukinsa )   Missed doses in the last 4 weeks: 0   Patient/Caregiver did not have any additional questions or concerns.   Therapeutic benefit summary: Patient is achieving benefit   Adverse events/side effects summary: No adverse events/side effects   Patient's therapy is appropriate to: Continue    Goals Addressed             This Visit's Progress    Slow Disease Progression   On track    Patient is on track. Patient will maintain adherence. Per provider note from 05/04/23, June PET scan did not show progression and September labs were within normal limits.          Follow up: 6 months  Malachi Screws Specialty Pharmacist

## 2023-12-22 DIAGNOSIS — H33322 Round hole, left eye: Secondary | ICD-10-CM | POA: Diagnosis not present

## 2024-01-08 DIAGNOSIS — Z299 Encounter for prophylactic measures, unspecified: Secondary | ICD-10-CM | POA: Diagnosis not present

## 2024-01-08 DIAGNOSIS — C9110 Chronic lymphocytic leukemia of B-cell type not having achieved remission: Secondary | ICD-10-CM | POA: Diagnosis not present

## 2024-01-08 DIAGNOSIS — R52 Pain, unspecified: Secondary | ICD-10-CM | POA: Diagnosis not present

## 2024-01-08 DIAGNOSIS — I10 Essential (primary) hypertension: Secondary | ICD-10-CM | POA: Diagnosis not present

## 2024-01-08 DIAGNOSIS — N183 Chronic kidney disease, stage 3 unspecified: Secondary | ICD-10-CM | POA: Diagnosis not present

## 2024-01-08 DIAGNOSIS — E1122 Type 2 diabetes mellitus with diabetic chronic kidney disease: Secondary | ICD-10-CM | POA: Diagnosis not present

## 2024-01-08 DIAGNOSIS — J439 Emphysema, unspecified: Secondary | ICD-10-CM | POA: Diagnosis not present

## 2024-01-12 ENCOUNTER — Other Ambulatory Visit: Payer: Self-pay

## 2024-01-15 ENCOUNTER — Other Ambulatory Visit: Payer: Self-pay

## 2024-01-15 NOTE — Progress Notes (Signed)
 Specialty Pharmacy Refill Coordination Note  Kevin Hooper is a 78 y.o. male contacted today regarding refills of specialty medication(s) Zanubrutinib  (Brukinsa )   Patient requested Delivery   Delivery date: 01/23/24   Verified address: [address] [place] [address]   Medication will be filled on 01/22/24.

## 2024-01-22 ENCOUNTER — Other Ambulatory Visit: Payer: Self-pay

## 2024-02-01 ENCOUNTER — Inpatient Hospital Stay: Attending: Hematology

## 2024-02-01 ENCOUNTER — Ambulatory Visit (HOSPITAL_COMMUNITY)
Admission: RE | Admit: 2024-02-01 | Discharge: 2024-02-01 | Disposition: A | Discharge status: Home / Self Care | Source: Ambulatory Visit | Attending: Hematology | Admitting: Hematology

## 2024-02-01 DIAGNOSIS — D631 Anemia in chronic kidney disease: Secondary | ICD-10-CM | POA: Diagnosis not present

## 2024-02-01 DIAGNOSIS — Z79899 Other long term (current) drug therapy: Secondary | ICD-10-CM | POA: Diagnosis not present

## 2024-02-01 DIAGNOSIS — C8305 Small cell B-cell lymphoma, lymph nodes of inguinal region and lower limb: Secondary | ICD-10-CM | POA: Insufficient documentation

## 2024-02-01 DIAGNOSIS — C8300 Small cell B-cell lymphoma, unspecified site: Secondary | ICD-10-CM

## 2024-02-01 DIAGNOSIS — I700 Atherosclerosis of aorta: Secondary | ICD-10-CM | POA: Diagnosis not present

## 2024-02-01 DIAGNOSIS — I129 Hypertensive chronic kidney disease with stage 1 through stage 4 chronic kidney disease, or unspecified chronic kidney disease: Secondary | ICD-10-CM | POA: Diagnosis not present

## 2024-02-01 DIAGNOSIS — N189 Chronic kidney disease, unspecified: Secondary | ICD-10-CM | POA: Diagnosis not present

## 2024-02-01 DIAGNOSIS — R911 Solitary pulmonary nodule: Secondary | ICD-10-CM | POA: Insufficient documentation

## 2024-02-01 DIAGNOSIS — E041 Nontoxic single thyroid nodule: Secondary | ICD-10-CM | POA: Diagnosis not present

## 2024-02-01 DIAGNOSIS — Z808 Family history of malignant neoplasm of other organs or systems: Secondary | ICD-10-CM | POA: Diagnosis not present

## 2024-02-01 DIAGNOSIS — Z87891 Personal history of nicotine dependence: Secondary | ICD-10-CM | POA: Diagnosis not present

## 2024-02-01 DIAGNOSIS — R918 Other nonspecific abnormal finding of lung field: Secondary | ICD-10-CM | POA: Diagnosis not present

## 2024-02-01 LAB — CBC WITH DIFFERENTIAL/PLATELET
Abs Immature Granulocytes: 0.03 10*3/uL (ref 0.00–0.07)
Basophils Absolute: 0.1 10*3/uL (ref 0.0–0.1)
Basophils Relative: 1 %
Eosinophils Absolute: 0.2 10*3/uL (ref 0.0–0.5)
Eosinophils Relative: 3 %
HCT: 44.2 % (ref 39.0–52.0)
Hemoglobin: 15 g/dL (ref 13.0–17.0)
Immature Granulocytes: 0 %
Lymphocytes Relative: 26 %
Lymphs Abs: 1.9 10*3/uL (ref 0.7–4.0)
MCH: 30.5 pg (ref 26.0–34.0)
MCHC: 33.9 g/dL (ref 30.0–36.0)
MCV: 90 fL (ref 80.0–100.0)
Monocytes Absolute: 0.6 10*3/uL (ref 0.1–1.0)
Monocytes Relative: 9 %
Neutro Abs: 4.3 10*3/uL (ref 1.7–7.7)
Neutrophils Relative %: 61 %
Platelets: 242 10*3/uL (ref 150–400)
RBC: 4.91 MIL/uL (ref 4.22–5.81)
RDW: 13.1 % (ref 11.5–15.5)
WBC: 7.1 10*3/uL (ref 4.0–10.5)
nRBC: 0 % (ref 0.0–0.2)

## 2024-02-01 LAB — COMPREHENSIVE METABOLIC PANEL WITH GFR
ALT: 16 U/L (ref 0–44)
AST: 12 U/L — ABNORMAL LOW (ref 15–41)
Albumin: 3.4 g/dL — ABNORMAL LOW (ref 3.5–5.0)
Alkaline Phosphatase: 60 U/L (ref 38–126)
Anion gap: 10 (ref 5–15)
BUN: 42 mg/dL — ABNORMAL HIGH (ref 8–23)
CO2: 24 mmol/L (ref 22–32)
Calcium: 8.7 mg/dL — ABNORMAL LOW (ref 8.9–10.3)
Chloride: 103 mmol/L (ref 98–111)
Creatinine, Ser: 2.63 mg/dL — ABNORMAL HIGH (ref 0.61–1.24)
GFR, Estimated: 25 mL/min — ABNORMAL LOW (ref >60)
Glucose, Bld: 185 mg/dL — ABNORMAL HIGH (ref 70–99)
Potassium: 4 mmol/L (ref 3.5–5.1)
Sodium: 137 mmol/L (ref 135–145)
Total Bilirubin: 0.7 mg/dL (ref 0.0–1.2)
Total Protein: 6.9 g/dL (ref 6.5–8.1)

## 2024-02-01 LAB — IRON AND TIBC
Iron: 72 ug/dL (ref 45–182)
Saturation Ratios: 26 % (ref 17.9–39.5)
TIBC: 277 ug/dL (ref 250–450)
UIBC: 205 ug/dL

## 2024-02-01 LAB — LACTATE DEHYDROGENASE: LDH: 124 U/L (ref 98–192)

## 2024-02-01 LAB — FERRITIN: Ferritin: 65 ng/mL (ref 24–336)

## 2024-02-01 LAB — MAGNESIUM: Magnesium: 2.2 mg/dL (ref 1.7–2.4)

## 2024-02-08 ENCOUNTER — Inpatient Hospital Stay: Admitting: Hematology

## 2024-02-08 VITALS — BP 159/76 | HR 61 | Temp 99.3°F | Resp 18 | Ht 68.0 in | Wt 185.2 lb

## 2024-02-08 DIAGNOSIS — N189 Chronic kidney disease, unspecified: Secondary | ICD-10-CM

## 2024-02-08 DIAGNOSIS — D631 Anemia in chronic kidney disease: Secondary | ICD-10-CM | POA: Diagnosis not present

## 2024-02-08 DIAGNOSIS — C8305 Small cell B-cell lymphoma, lymph nodes of inguinal region and lower limb: Secondary | ICD-10-CM | POA: Diagnosis not present

## 2024-02-08 DIAGNOSIS — C8300 Small cell B-cell lymphoma, unspecified site: Secondary | ICD-10-CM

## 2024-02-08 NOTE — Progress Notes (Signed)
 [facility] [address] [place] [address]    Clinic Day:  02/08/2024  Referring physician: Maree Isles, MD  Patient Care Team: Maree Isles, MD as PCP - General (Internal Medicine)   ASSESSMENT & PLAN:   Assessment: 1.  Stage III small lymphocytic lymphoma: - Recent CT for hematuria work-up on 04/05/2018 showed several retroperitoneal lymph nodes measuring 1.2 cm and pelvic lymph nodes bilaterally, largest in the left obturator region measuring 2.2 cm. -Patient does not have any B symptoms including fevers, night sweats or weight loss. - CT scan from 2010 done at [facility] also showed lymphadenopathy and splenomegaly.  - PET/CT scan on 05/28/2018 showed very low metabolic activity associated with enlarged pelvic and retroperitoneal and axillary nodes.  Spleen and bone marrow are normal.  We discussed the normal progression of low-grade lymphomas in detail. - Ultrasound of the abdomen from 05/30/2020 from [facility] showed normal liver.  Splenomegaly measuring 13.4 x 7.8 x 15.4 cm. - he has history of lymphadenopathy in the retroperitoneal region dating back to 2010.  He does not have any B symptoms or cytopenias. - His renal function has been worsening lately.  Renal ultrasound on 08/09/2021 showed bilateral cortical irregularity consistent with scarring with normal renal echogenicity.  Mild left hydronephrosis. - CT renal study on 09/08/2021: Splenomegaly.  New para-aortic lymphadenopathy.  Hazy retroperitoneum surrounding the kidneys and aortic adenopathy.  Lymph node left of the aorta at the level of the left kidney measures 15 mm enlarged from prior PET scan.  Multiple periaortic lymph nodes similar in size.  Adenopathy extends along the iliac chains with bulky external iliac nodes. - LDH level is normal.  The creatinine is 1.73. - PET scan (10/18/2021): Mildly metabolic adenopathy above and below the diaphragm with mild splenomegaly, worsened  since PET scan from 2019.  SUV of the lymph nodes is less than 5.  Left periaortic lymph node at the level of the left kidney measures 1.8 cm in short axis with SUV 3.2, previously measuring 6 mm on PET scan from 2019.  Left inguinal lymph node measures 2.1 cm with SUV 3.8.  Hazy retroperitoneal and mesenteric stranding with low-level associated metabolic activity in the left infrarenal retroperitoneum with SUV 3.1. - There is a question of whether this retroperitoneal/mesenteric stranding is contributing to his hydronephrosis causing worsening renal function. - Left inguinal lymph node biopsy (11/01/2021): Small lymphocytic lymphoma - CLL FISH panel: Trisomy 12 (neutral prognosis), deletion 13 q. 14 - Indication for treatment: Threatened endorgan function - Zanubrutinib  160 mg twice daily started on 11/18/2021. - PET scan (02/24/2022): Good response to treatment with significant interval decrease in size of lymphadenopathy in the neck, chest, abdomen and pelvis and no significant residual hypermetabolic some (Deauville 1).  Stable mild splenomegaly.   2.  Right thyroid  nodule: - PET scan showed incidental 1.5 cm thyroid  nodule. - Thyroid  biopsy on 05/19/2023: Benign follicular nodule.  No further follow-up necessary.    Plan: 1.  Stage III small lymphocytic lymphoma: - PET scan on 01/12/2023 with no signs of tracer avid tumor.  Continued interval decrease in size of the abdominal pelvic lymph nodes.  Borderline enlarged spleen without increase in size. - He is tolerating Zanubrutinib  very well.  No B symptoms or recent infections. - Labs from 02/01/2024: Normal LFTs.  Creatinine elevated at 2.63, partly from Zanubrutinib .  CBC was normal. - Reviewed CT chest without contrast from 02/01/2024: Previously seen 9 mm right lower lobe  lung nodule has resolved.  3 mm nodules in the right lower lobe were seen.  Similar appearance of bilateral axillary and mediastinal lymph nodes measuring up to 9 mm. - Recommend  continuing Zanubrutinib  160 mg twice daily until progression.  RTC 4 months for follow-up with repeat labs.  Will consider imaging only if clinical condition dictates.   2.  Mild normocytic anemia: - Combination anemia from CKD and functional iron deficiency.  Ferritin is 65, percent saturation is 26.  Hemoglobin has improved to 15 from 12.  Continue iron tablet daily.  3.  CKD: - He is on farxiga  10 mg daily.  Creatinine elevation could be from Zanubrutinib .  4.  High risk drug monitoring: - No bruising or bleeding reported.  Heart rate is regular with no clinical evidence of A-fib at this time.  He reports blood pressure at home is close to normal. - I have discussed increased risk of bleeding with BTK inhibitors.  He will hold the medication 1 week prior and 1 to 2 weeks after any planned surgery in the future.   5.  Hypertension: - Continue telmisartan, Toprol -XL and Norvasc.  Blood pressure today is 159/76.  He reports that he is stressed.     Orders Placed This Encounter  Procedures   CBC with Differential    Standing Status:   Future    Expected Date:   06/03/2024    Expiration Date:   09/01/2024   Comprehensive metabolic panel    Standing Status:   Future    Expected Date:   06/03/2024    Expiration Date:   09/01/2024   Lactate dehydrogenase    Standing Status:   Future    Expected Date:   06/03/2024    Expiration Date:   09/01/2024   Iron and TIBC ([facility] ONLY)    Standing Status:   Future    Expected Date:   06/03/2024    Expiration Date:   09/01/2024   Ferritin    Standing Status:   Future    Expected Date:   06/03/2024    Expiration Date:   09/01/2024      LILLETTE Hummingbird R Teague,acting as a scribe for Alean Stands, MD.,have documented all relevant documentation on the behalf of Alean Stands, MD,as directed by  Alean Stands, MD while in the presence of Alean Stands, MD.  I, Alean Stands MD, have reviewed the above  documentation for accuracy and completeness, and I agree with the above.      Alean Stands, MD   7/10/20253:35 PM  CHIEF COMPLAINT:   Diagnosis: stage III small lymphocytic lymphoma    Cancer Staging  Small lymphocytic lymphoma (HCC) Staging form: Hodgkin and Non-Hodgkin Lymphoma, AJCC 8th Edition - Clinical stage from 11/10/2021: Stage III (Small lymphocytic leukemia) - Unsigned    Prior Therapy: none  Current Therapy:  Zanubrutinib  160 mg twice daily    HISTORY OF PRESENT ILLNESS:   Oncology History   No history exists.     INTERVAL HISTORY:   Kevin Hooper is a 78 y.o. male presenting to clinic today for follow up of stage III small lymphocytic lymphoma. He was last seen by me on 11/09/23.  Since his last visit, he underwent CT chest on 02/01/24 that found: No acute intrathoracic abnormality. Specifically, no pneumonia, pulmonary edema, or pleural effusion. Interval resolution of the 9 mm right lower lobe nodule on the prior CT. A couple of new 3 mm nodules are present in the right lower lobe (for example,  axial 106). Similar appearance of the bilateral axillary and multistation mediastinal lymph nodes measuring up to 9 mm.  Today, he states that he is doing well overall. His appetite level is at 100%. His energy level is at 75%. Leander is accompanied by his wife. He is taking iron tablets daily. His wife notes he is very active and works outside and at [employer] daily. He has follow-up with Dr. Rachele in August 2025.   Tayton is tolerating Zanubrutinib  well and taking it as prescribed. He denies any bleeding or bruising. He continues to require Farxiga  for CKD and is taking it as prescribed.   PAST MEDICAL HISTORY:   Past Medical History: Past Medical History:  Diagnosis Date   Arthritis    Cataracts, bilateral 11/2016   Chronic kidney disease    sTAGE 3   Diabetes mellitus without complication (HCC)    Gout    Hyperlipidemia    Hypertension    Joint ache     Prostate atrophy 2018    Surgical History: Past Surgical History:  Procedure Laterality Date   COLON RESECTION  2015   EXCISION OF SKIN TAG Left 11/01/2021   Procedure: EXCISION OF SKIN TAG;  Surgeon: Kallie Manuelita BROCKS, MD;  Location: [facility];  Service: General;  Laterality: Left;   HERNIA REPAIR     UMBILICAL   INGUINAL LYMPH NODE BIOPSY Left 11/01/2021   Procedure: INGUINAL LYMPH NODE BIOPSY- LEFT;  Surgeon: Kallie Manuelita BROCKS, MD;  Location: [facility];  Service: General;  Laterality: Left;   LUMBAR FUSION  1990   OTHER SURGICAL HISTORY Left    Arm s/p accident   REPLACEMENT TOTAL KNEE BILATERAL Bilateral 1990   TRANSURETHRAL RESECTION OF PROSTATE N/A 07/04/2017   Procedure: TRANSURETHRAL RESECTION OF THE PROSTATE (TURP);  Surgeon: Watt Rush, MD;  Location: [facility];  Service: Urology;  Laterality: N/A;    Social History: Social History   Socioeconomic History   Marital status: Married    Spouse name: Not on file   Number of children: 2   Years of education: 12+   Highest education level: Not on file  Occupational History   Occupation: Retired  Tobacco Use   Smoking status: Former    Current packs/day: 0.00    Average packs/day: 2.0 packs/day for 25.0 years (50.0 ttl pk-yrs)    Types: Cigarettes    Start date: 04/29/1967    Quit date: 04/28/1992    Years since quitting: 31.8   Smokeless tobacco: Never  Vaping Use   Vaping status: Never Used  Substance and Sexual Activity   Alcohol  use: Yes    Comment: Occasional   Drug use: No   Sexual activity: Not on file    Comment: Married  Other Topics Concern   Not on file  Social History Narrative   Lives at home w/ his wife   Right-handed   Caffeine: occasional tea   Social Drivers of Corporate investment banker Strain: Low Risk  (06/16/2020)   Overall Financial Resource Strain (CARDIA)    Difficulty of Paying Living Expenses: Not hard at all  Food Insecurity: No Food Insecurity (06/16/2020)   Hunger Vital Sign     Worried About Running Out of Food in the Last Year: Never true    Ran Out of Food in the Last Year: Never true  Transportation Needs: No Transportation Needs (06/16/2020)   PRAPARE - Administrator, Civil Service (Medical): No    Lack of Transportation (Non-Medical):  No  Physical Activity: Insufficiently Active (06/16/2020)   Exercise Vital Sign    Days of Exercise per Week: 7 days    Minutes of Exercise per Session: 20 min  Stress: No Stress Concern Present (06/16/2020)   [employer] of Occupational Health - Occupational Stress Questionnaire    Feeling of Stress : Not at all  Social Connections: Moderately Integrated (06/16/2020)   Social Connection and Isolation Panel    Frequency of Communication with Friends and Family: More than three times a week    Frequency of Social Gatherings with Friends and Family: Twice a week    Attends Religious Services: More than 4 times per year    Active Member of [employer] or Organizations: No    Attends Banker Meetings: Never    Marital Status: Married  Catering manager Violence: Not At Risk (06/16/2020)   Humiliation, Afraid, Rape, and Kick questionnaire    Fear of Current or Ex-Partner: No    Emotionally Abused: No    Physically Abused: No    Sexually Abused: No    Family History: Family History  Problem Relation Age of Onset   COPD Father    Throat cancer Father    Cancer Brother     Current Medications:  Current Outpatient Medications:    acetaminophen  (TYLENOL ) 650 MG CR tablet, Take 650 mg by mouth every 8 (eight) hours as needed (Arthritis)., Disp: , Rfl:    albuterol  (PROVENTIL  HFA;VENTOLIN  HFA) 108 (90 Base) MCG/ACT inhaler, Inhale 2 puffs into the lungs every 6 (six) hours as needed for wheezing or shortness of breath. , Disp: , Rfl:    allopurinol  (ZYLOPRIM ) 300 MG tablet, TAKE ONE TABLET BY MOUTH ONCE DAILY, Disp: 30 tablet, Rfl: 3   amLODipine (NORVASC) 10 MG tablet, Take 10 mg by mouth daily.,  Disp: , Rfl:    b complex vitamins capsule, Take 1 capsule by mouth daily. Folic acid , Disp: , Rfl:    Blood Glucose Monitoring Suppl (ONETOUCH VERIO FLEX SYSTEM) w/Device KIT, daily., Disp: , Rfl:    calcitRIOL (ROCALTROL) 0.25 MCG capsule, Take 0.25 mcg by mouth 3 (three) times a week., Disp: , Rfl:    Cyanocobalamin  (VITAMIN B-12 PO), Take by mouth., Disp: , Rfl:    FARXIGA  10 MG TABS tablet, Take 1 tablet (10 mg total) by mouth daily., Disp: 30 tablet, Rfl: 0   glucosamine-chondroitin 500-400 MG tablet, Take 1 tablet by mouth daily., Disp: , Rfl:    Lancets (ONETOUCH DELICA PLUS LANCET33G) MISC, See admin instructions., Disp: , Rfl:    MEGARED OMEGA-3 KRILL OIL PO, Take by mouth daily., Disp: , Rfl:    metoprolol  succinate (TOPROL -XL) 100 MG 24 hr tablet, Take 100 mg by mouth daily. , Disp: , Rfl: 1   Multiple Vitamins-Minerals (MULTIVITAMIN WITH MINERALS) tablet, Take 1 tablet by mouth daily., Disp: , Rfl:    Omega-3 Fatty Acids (OMEGA-3 FISH OIL PO), Take by mouth., Disp: , Rfl:    ONETOUCH VERIO test strip, 1 each daily., Disp: , Rfl:    rosuvastatin (CRESTOR) 5 MG tablet, Take 5 mg by mouth at bedtime., Disp: , Rfl:    telmisartan (MICARDIS) 20 MG tablet, Take 20 mg by mouth as directed. Tues-Thurs-Sat-Sun, Disp: , Rfl:    vitamin C (ASCORBIC ACID) 500 MG tablet, Take 1,000 mg by mouth daily., Disp: , Rfl:    zanubrutinib  (BRUKINSA ) 80 MG capsule, Take 2 tablets (160 mg) by mouth 2 (two) times daily., Disp: 120 capsule, Rfl: 1  Allergies: No Known Allergies  REVIEW OF SYSTEMS:   Review of Systems  Constitutional:  Negative for chills, fatigue and fever.  HENT:   Negative for lump/mass, mouth sores, nosebleeds, sore throat and trouble swallowing.   Eyes:  Negative for eye problems.  Respiratory:  Negative for cough and shortness of breath.   Cardiovascular:  Negative for chest pain, leg swelling and palpitations.  Gastrointestinal:  Negative for abdominal pain, constipation,  diarrhea, nausea and vomiting.  Genitourinary:  Negative for bladder incontinence, difficulty urinating, dysuria, frequency, hematuria and nocturia.   Musculoskeletal:  Positive for back pain (5/10 severity). Negative for arthralgias, flank pain, myalgias and neck pain.  Skin:  Negative for itching and rash.  Neurological:  Negative for dizziness, headaches and numbness.  Hematological:  Does not bruise/bleed easily.  Psychiatric/Behavioral:  Negative for depression, sleep disturbance and suicidal ideas. The patient is not nervous/anxious.   All other systems reviewed and are negative.    VITALS:   Blood pressure (!) 159/76, pulse 61, temperature 99.3 F (37.4 C), temperature source Tympanic, resp. rate 18, height 5' 8 (1.727 m), weight 185 lb 3.2 oz (84 kg), SpO2 98%.  Wt Readings from Last 3 Encounters:  02/08/24 185 lb 3.2 oz (84 kg)  11/09/23 182 lb 9.6 oz (82.8 kg)  08/10/23 188 lb 14.4 oz (85.7 kg)    Body mass index is 28.16 kg/m.  Performance status (ECOG): 1 - Symptomatic but completely ambulatory  PHYSICAL EXAM:   Physical Exam Vitals and nursing note reviewed. Exam conducted with a chaperone present.  Constitutional:      Appearance: Normal appearance.  Cardiovascular:     Rate and Rhythm: Normal rate and regular rhythm.     Pulses: Normal pulses.     Heart sounds: Normal heart sounds.  Pulmonary:     Effort: Pulmonary effort is normal.     Breath sounds: Normal breath sounds.  Abdominal:     Palpations: Abdomen is soft. There is no hepatomegaly, splenomegaly or mass.     Tenderness: There is no abdominal tenderness.  Musculoskeletal:     Right lower leg: No edema.     Left lower leg: No edema.  Lymphadenopathy:     Cervical: No cervical adenopathy.     Right cervical: No superficial, deep or posterior cervical adenopathy.    Left cervical: No superficial, deep or posterior cervical adenopathy.     Upper Body:     Right upper body: No supraclavicular or  axillary adenopathy.     Left upper body: No supraclavicular or axillary adenopathy.  Neurological:     General: No focal deficit present.     Mental Status: He is alert and oriented to person, place, and time.  Psychiatric:        Mood and Affect: Mood normal.        Behavior: Behavior normal.     LABS:      Latest Ref Rng & Units 02/01/2024    1:58 PM 11/02/2023    2:01 PM 08/03/2023    8:10 AM  CBC  WBC 4.0 - 10.5 K/uL 7.1  5.9  6.9   Hemoglobin 13.0 - 17.0 g/dL 84.9  87.8  88.6   Hematocrit 39.0 - 52.0 % 44.2  36.6  34.0   Platelets 150 - 400 K/uL 242  235  232       Latest Ref Rng & Units 02/01/2024    1:58 PM 11/02/2023    2:01 PM 08/03/2023  8:10 AM  CMP  Glucose 70 - 99 mg/dL 814  892  813   BUN 8 - 23 mg/dL 42  38  33   Creatinine 0.61 - 1.24 mg/dL 7.36  7.76  7.61   Sodium 135 - 145 mmol/L 137  136  133   Potassium 3.5 - 5.1 mmol/L 4.0  4.3  4.3   Chloride 98 - 111 mmol/L 103  102  98   CO2 22 - 32 mmol/L 24  27  26    Calcium 8.9 - 10.3 mg/dL 8.7  8.8  8.7   Total Protein 6.5 - 8.1 g/dL 6.9  7.0  7.0   Total Bilirubin 0.0 - 1.2 mg/dL 0.7  0.7  0.8   Alkaline Phos 38 - 126 U/L 60  54  53   AST 15 - 41 U/L 12  13  23    ALT 0 - 44 U/L 16  13  34      No results found for: CEA1, CEA / No results found for: CEA1, CEA Lab Results  Component Value Date   PSA1 4.7 (H) 09/15/2021   No results found for: CAN199 No results found for: CAN125  No results found for: STEPHANY CARLOTA BENSON MARKEL EARLA JOANNIE DOC, MSPIKE, SPEI Lab Results  Component Value Date   TIBC 277 02/01/2024   TIBC 288 11/02/2023   TIBC 274 08/03/2023   FERRITIN 65 02/01/2024   FERRITIN 74 11/02/2023   FERRITIN 142 08/03/2023   IRONPCTSAT 26 02/01/2024   IRONPCTSAT 21 11/02/2023   IRONPCTSAT 30 08/03/2023   Lab Results  Component Value Date   LDH 124 02/01/2024   LDH 130 11/02/2023   LDH 147 08/03/2023     STUDIES:   CT CHEST WO  CONTRAST Result Date: 02/04/2024 CLINICAL DATA:  Lung nodule, < 6mm, high cancer risk EXAM: CT CHEST WITHOUT CONTRAST TECHNIQUE: Multidetector CT imaging of the chest was performed following the standard protocol without IV contrast. RADIATION DOSE REDUCTION: This exam was performed according to the departmental dose-optimization program which includes automated exposure control, adjustment of the mA and/or kV according to patient size and/or use of iterative reconstruction technique. COMPARISON:  July 27, 2023, January 12, 2023, February 24, 2022 FINDINGS: Cardiovascular: No cardiomegaly or pericardial effusion. No aortic aneurysm. Diffuse aortic and multi-vessel coronary atherosclerosis. Mediastinum/Nodes: No mediastinal mass. Subcentimeter bilateral axillary and multistation mediastinal lymph nodes measuring up to 9 mm, unchanged. Lungs/Pleura: The midline trachea and bronchi are patent. No lobar airspace consolidation, pleural effusion, or pneumothorax. Patchy ground-glass opacities persist in the dependent right lower lobe in the region of dependent nodular consolidation, likely postinfectious scarring. The 9 mm nodule on the prior CT has resolved in the interim. New 3 mm subpleural right lower lobe nodule (axial 106). Unchanged 2 mm micronodule in the dependent right lower lobe (axial 124). 3 mm nodule in the lateral right lower lobe, superior segment (axial 100). Unchanged 2 mm anterior left upper lobe nodule (axial 41). Punctate left upper lobe calcified granuloma. Musculoskeletal: No acute fracture or destructive bone lesion. Diffuse osteopenia. Multilevel degenerative disc disease of the spine. Bilateral glenohumeral joint osteoarthritis. Remote, unchanged anterior left rib fractures. Chronic compression fractures of T2 and T3. Upper Abdomen: No acute abnormality in the partially visualized upper abdomen. IMPRESSION: 1. No acute intrathoracic abnormality. Specifically, no pneumonia, pulmonary edema, or  pleural effusion. 2. Interval resolution of the 9 mm right lower lobe nodule on the prior CT. A couple of new 3 mm nodules are  present in the right lower lobe (for example, axial 106). Continued follow-up per routine oncologic protocols recommended. 3. Similar appearance of the bilateral axillary and multistation mediastinal lymph nodes measuring up to 9 mm. Aortic Atherosclerosis (ICD10-I70.0). Electronically Signed   By: Rogelia Myers M.D.   On: 02/04/2024 13:31

## 2024-02-08 NOTE — Patient Instructions (Addendum)
 [facility] at [facility] Discharge Instructions   You were seen and examined today by Dr. Rogers.  He reviewed the results of your lab work which are normal/stable.   He reviewed the results of your CT scan which did not show any evidence of cancer/lymphoma.   We will see you back in 4 months.   Return as scheduled.    Thank you for choosing [facility] at [facility] to provide your oncology and hematology care.  To afford each patient quality time with our provider, please arrive at least 15 minutes before your scheduled appointment time.   If you have a lab appointment with the Cancer Center please come in thru the Main Entrance and check in at the main information desk.  You need to re-schedule your appointment should you arrive 10 or more minutes late.  We strive to give you quality time with our providers, and arriving late affects you and other patients whose appointments are after yours.  Also, if you no show three or more times for appointments you may be dismissed from the clinic at the providers discretion.     Again, thank you for choosing [facility].  Our hope is that these requests will decrease the amount of time that you wait before being seen by our physicians.       _____________________________________________________________  Should you have questions after your visit to [facility], please contact our office at [phone] and follow the prompts.  Our office hours are 8:00 a.m. and 4:30 p.m. Monday - Friday.  Please note that voicemails left after 4:00 p.m. may not be returned until the following business day.  We are closed weekends and major holidays.  You do have access to a nurse 24-7, just call the main number to the clinic [phone] and do not press any options, hold on the line and a nurse will answer the phone.    For prescription refill requests, have your pharmacy contact our  office and allow 72 hours.    Due to Covid, you will need to wear a mask upon entering the hospital. If you do not have a mask, a mask will be given to you at the Main Entrance upon arrival. For doctor visits, patients may have 1 support person age 75 or older with them. For treatment visits, patients can not have anyone with them due to social distancing guidelines and our immunocompromised population.

## 2024-02-08 NOTE — Progress Notes (Signed)
Patient is taking Brukinsa as prescribed.  He has not missed any doses and reports no side effects at this time.   

## 2024-02-14 ENCOUNTER — Other Ambulatory Visit: Payer: Self-pay

## 2024-02-14 ENCOUNTER — Other Ambulatory Visit: Payer: Self-pay | Admitting: Pharmacy Technician

## 2024-02-14 ENCOUNTER — Other Ambulatory Visit: Payer: Self-pay | Admitting: Hematology

## 2024-02-14 DIAGNOSIS — C8300 Small cell B-cell lymphoma, unspecified site: Secondary | ICD-10-CM

## 2024-02-14 MED ORDER — BRUKINSA 80 MG PO CAPS
160.0000 mg | ORAL_CAPSULE | Freq: Two times a day (BID) | ORAL | 1 refills | Status: DC
  Filled 2024-02-14: qty 120, 30d supply, fill #0
  Filled 2024-03-14: qty 120, 30d supply, fill #1

## 2024-02-14 NOTE — Telephone Encounter (Signed)
Refill approved for Brukinsa.  Patient is tolerating and is to continue therapy.

## 2024-02-14 NOTE — Progress Notes (Signed)
 Specialty Pharmacy Refill Coordination Note  Kevin Hooper is a 78 y.o. male contacted today regarding refills of specialty medication(s) Zanubrutinib  (Brukinsa )   Patient requested Delivery   Delivery date: 02/21/24   Verified address: [address] [place] [address]   Medication will be filled on 02/20/24.  This fill date is pending response to refill request from provider. Patient is aware and if they have not received fill by intended date they must follow up with pharmacy.   Spoke to patient's spouse.

## 2024-03-08 DIAGNOSIS — N3090 Cystitis, unspecified without hematuria: Secondary | ICD-10-CM | POA: Diagnosis not present

## 2024-03-08 DIAGNOSIS — E1169 Type 2 diabetes mellitus with other specified complication: Secondary | ICD-10-CM | POA: Diagnosis not present

## 2024-03-08 DIAGNOSIS — N3091 Cystitis, unspecified with hematuria: Secondary | ICD-10-CM | POA: Diagnosis not present

## 2024-03-08 DIAGNOSIS — I10 Essential (primary) hypertension: Secondary | ICD-10-CM | POA: Diagnosis not present

## 2024-03-08 DIAGNOSIS — E875 Hyperkalemia: Secondary | ICD-10-CM | POA: Diagnosis not present

## 2024-03-08 DIAGNOSIS — J439 Emphysema, unspecified: Secondary | ICD-10-CM | POA: Diagnosis not present

## 2024-03-08 DIAGNOSIS — C9110 Chronic lymphocytic leukemia of B-cell type not having achieved remission: Secondary | ICD-10-CM | POA: Diagnosis not present

## 2024-03-08 DIAGNOSIS — R339 Retention of urine, unspecified: Secondary | ICD-10-CM | POA: Diagnosis not present

## 2024-03-08 DIAGNOSIS — J449 Chronic obstructive pulmonary disease, unspecified: Secondary | ICD-10-CM | POA: Diagnosis not present

## 2024-03-08 DIAGNOSIS — N2889 Other specified disorders of kidney and ureter: Secondary | ICD-10-CM | POA: Diagnosis not present

## 2024-03-08 DIAGNOSIS — Z87891 Personal history of nicotine dependence: Secondary | ICD-10-CM | POA: Diagnosis not present

## 2024-03-08 DIAGNOSIS — Z299 Encounter for prophylactic measures, unspecified: Secondary | ICD-10-CM | POA: Diagnosis not present

## 2024-03-08 DIAGNOSIS — R52 Pain, unspecified: Secondary | ICD-10-CM | POA: Diagnosis not present

## 2024-03-08 DIAGNOSIS — N400 Enlarged prostate without lower urinary tract symptoms: Secondary | ICD-10-CM | POA: Diagnosis not present

## 2024-03-08 DIAGNOSIS — R319 Hematuria, unspecified: Secondary | ICD-10-CM | POA: Diagnosis not present

## 2024-03-08 DIAGNOSIS — K828 Other specified diseases of gallbladder: Secondary | ICD-10-CM | POA: Diagnosis not present

## 2024-03-08 DIAGNOSIS — E785 Hyperlipidemia, unspecified: Secondary | ICD-10-CM | POA: Diagnosis not present

## 2024-03-08 DIAGNOSIS — E119 Type 2 diabetes mellitus without complications: Secondary | ICD-10-CM | POA: Diagnosis not present

## 2024-03-11 DIAGNOSIS — R339 Retention of urine, unspecified: Secondary | ICD-10-CM | POA: Diagnosis not present

## 2024-03-11 DIAGNOSIS — E1169 Type 2 diabetes mellitus with other specified complication: Secondary | ICD-10-CM | POA: Diagnosis not present

## 2024-03-11 DIAGNOSIS — R52 Pain, unspecified: Secondary | ICD-10-CM | POA: Diagnosis not present

## 2024-03-11 DIAGNOSIS — Z299 Encounter for prophylactic measures, unspecified: Secondary | ICD-10-CM | POA: Diagnosis not present

## 2024-03-11 DIAGNOSIS — N400 Enlarged prostate without lower urinary tract symptoms: Secondary | ICD-10-CM | POA: Diagnosis not present

## 2024-03-11 DIAGNOSIS — I10 Essential (primary) hypertension: Secondary | ICD-10-CM | POA: Diagnosis not present

## 2024-03-12 ENCOUNTER — Telehealth: Payer: Self-pay

## 2024-03-12 NOTE — Telephone Encounter (Signed)
 Patients wife called back in regard to the after ours nurse call from 03/11/24 at 5:01 PM. She advised they longer needed an appt with our office. [facility] in [place] was able to get the patient in this week.

## 2024-03-13 DIAGNOSIS — R310 Gross hematuria: Secondary | ICD-10-CM | POA: Diagnosis not present

## 2024-03-13 DIAGNOSIS — N401 Enlarged prostate with lower urinary tract symptoms: Secondary | ICD-10-CM | POA: Diagnosis not present

## 2024-03-13 DIAGNOSIS — R338 Other retention of urine: Secondary | ICD-10-CM | POA: Diagnosis not present

## 2024-03-14 ENCOUNTER — Other Ambulatory Visit: Payer: Self-pay | Admitting: Pharmacy Technician

## 2024-03-14 ENCOUNTER — Other Ambulatory Visit: Payer: Self-pay

## 2024-03-14 NOTE — Progress Notes (Signed)
 Specialty Pharmacy Refill Coordination Note  Kevin Hooper is a 78 y.o. male contacted today regarding refills of specialty medication(s) Zanubrutinib  (Brukinsa )  Spoke with Wife.  Patient requested Delivery   Delivery date: 03/21/24   Verified address: [address]  [place]   Medication will be filled on 03/20/24.

## 2024-03-15 DIAGNOSIS — D631 Anemia in chronic kidney disease: Secondary | ICD-10-CM | POA: Diagnosis not present

## 2024-03-15 DIAGNOSIS — R809 Proteinuria, unspecified: Secondary | ICD-10-CM | POA: Diagnosis not present

## 2024-03-15 DIAGNOSIS — E211 Secondary hyperparathyroidism, not elsewhere classified: Secondary | ICD-10-CM | POA: Diagnosis not present

## 2024-03-15 DIAGNOSIS — N189 Chronic kidney disease, unspecified: Secondary | ICD-10-CM | POA: Diagnosis not present

## 2024-03-19 DIAGNOSIS — Z0000 Encounter for general adult medical examination without abnormal findings: Secondary | ICD-10-CM | POA: Diagnosis not present

## 2024-03-19 DIAGNOSIS — Z7189 Other specified counseling: Secondary | ICD-10-CM | POA: Diagnosis not present

## 2024-03-19 DIAGNOSIS — E1169 Type 2 diabetes mellitus with other specified complication: Secondary | ICD-10-CM | POA: Diagnosis not present

## 2024-03-19 DIAGNOSIS — Z1331 Encounter for screening for depression: Secondary | ICD-10-CM | POA: Diagnosis not present

## 2024-03-19 DIAGNOSIS — Z299 Encounter for prophylactic measures, unspecified: Secondary | ICD-10-CM | POA: Diagnosis not present

## 2024-03-19 DIAGNOSIS — I10 Essential (primary) hypertension: Secondary | ICD-10-CM | POA: Diagnosis not present

## 2024-03-19 DIAGNOSIS — Z1339 Encounter for screening examination for other mental health and behavioral disorders: Secondary | ICD-10-CM | POA: Diagnosis not present

## 2024-03-19 DIAGNOSIS — N183 Chronic kidney disease, stage 3 unspecified: Secondary | ICD-10-CM | POA: Diagnosis not present

## 2024-03-19 DIAGNOSIS — N400 Enlarged prostate without lower urinary tract symptoms: Secondary | ICD-10-CM | POA: Diagnosis not present

## 2024-03-20 ENCOUNTER — Other Ambulatory Visit: Payer: Self-pay

## 2024-03-22 DIAGNOSIS — N049 Nephrotic syndrome with unspecified morphologic changes: Secondary | ICD-10-CM | POA: Diagnosis not present

## 2024-03-22 DIAGNOSIS — I129 Hypertensive chronic kidney disease with stage 1 through stage 4 chronic kidney disease, or unspecified chronic kidney disease: Secondary | ICD-10-CM | POA: Diagnosis not present

## 2024-03-22 DIAGNOSIS — N184 Chronic kidney disease, stage 4 (severe): Secondary | ICD-10-CM | POA: Diagnosis not present

## 2024-03-22 DIAGNOSIS — N139 Obstructive and reflux uropathy, unspecified: Secondary | ICD-10-CM | POA: Diagnosis not present

## 2024-04-08 DIAGNOSIS — R310 Gross hematuria: Secondary | ICD-10-CM | POA: Diagnosis not present

## 2024-04-08 DIAGNOSIS — R350 Frequency of micturition: Secondary | ICD-10-CM | POA: Diagnosis not present

## 2024-04-08 DIAGNOSIS — N401 Enlarged prostate with lower urinary tract symptoms: Secondary | ICD-10-CM | POA: Diagnosis not present

## 2024-04-12 ENCOUNTER — Other Ambulatory Visit: Payer: Self-pay | Admitting: Hematology

## 2024-04-12 ENCOUNTER — Other Ambulatory Visit: Payer: Self-pay

## 2024-04-12 DIAGNOSIS — C8300 Small cell B-cell lymphoma, unspecified site: Secondary | ICD-10-CM

## 2024-04-12 MED ORDER — BRUKINSA 80 MG PO CAPS
160.0000 mg | ORAL_CAPSULE | Freq: Two times a day (BID) | ORAL | 1 refills | Status: DC
Start: 2024-04-12 — End: 2024-06-07
  Filled 2024-04-12 – 2024-04-18 (×2): qty 120, 30d supply, fill #0
  Filled 2024-05-17: qty 120, 30d supply, fill #1

## 2024-04-16 ENCOUNTER — Other Ambulatory Visit: Payer: Self-pay

## 2024-04-16 ENCOUNTER — Other Ambulatory Visit (HOSPITAL_COMMUNITY): Payer: Self-pay

## 2024-04-18 ENCOUNTER — Other Ambulatory Visit: Payer: Self-pay

## 2024-04-18 NOTE — Progress Notes (Signed)
 Specialty Pharmacy Refill Coordination Note  Kevin Hooper is a 78 y.o. male contacted today regarding refills of specialty medication(s) Zanubrutinib  (Brukinsa )  Spoke with patient's wife  Patient requested Delivery   Delivery date: 04/25/24   Verified address: [address]  [place]   Medication will be filled on 09.24.25.

## 2024-04-24 ENCOUNTER — Other Ambulatory Visit: Payer: Self-pay

## 2024-04-24 DIAGNOSIS — Z23 Encounter for immunization: Secondary | ICD-10-CM | POA: Diagnosis not present

## 2024-04-24 DIAGNOSIS — N183 Chronic kidney disease, stage 3 unspecified: Secondary | ICD-10-CM | POA: Diagnosis not present

## 2024-04-24 DIAGNOSIS — Z299 Encounter for prophylactic measures, unspecified: Secondary | ICD-10-CM | POA: Diagnosis not present

## 2024-04-24 DIAGNOSIS — E119 Type 2 diabetes mellitus without complications: Secondary | ICD-10-CM | POA: Diagnosis not present

## 2024-04-24 DIAGNOSIS — I10 Essential (primary) hypertension: Secondary | ICD-10-CM | POA: Diagnosis not present

## 2024-04-26 DIAGNOSIS — H33322 Round hole, left eye: Secondary | ICD-10-CM | POA: Diagnosis not present

## 2024-05-17 ENCOUNTER — Other Ambulatory Visit: Payer: Self-pay

## 2024-05-17 ENCOUNTER — Encounter (INDEPENDENT_AMBULATORY_CARE_PROVIDER_SITE_OTHER): Payer: Self-pay

## 2024-05-17 NOTE — Progress Notes (Signed)
 Specialty Pharmacy Refill Coordination Note  Kevin Hooper is a 78 y.o. male contacted today regarding refills of specialty medication(s) Zanubrutinib  (Brukinsa )   Patient requested (Patient-Rptd) Delivery   Delivery date: 05/21/24   Verified address: (Patient-Rptd) [address] [place] [address]   Medication will be filled on 05/20/24.

## 2024-06-06 ENCOUNTER — Other Ambulatory Visit (HOSPITAL_COMMUNITY)
Admission: RE | Admit: 2024-06-06 | Discharge: 2024-06-06 | Disposition: A | Discharge status: Home / Self Care | Source: Ambulatory Visit | Attending: Nephrology | Admitting: Nephrology

## 2024-06-06 ENCOUNTER — Inpatient Hospital Stay: Attending: Hematology

## 2024-06-06 DIAGNOSIS — Z87891 Personal history of nicotine dependence: Secondary | ICD-10-CM | POA: Insufficient documentation

## 2024-06-06 DIAGNOSIS — E041 Nontoxic single thyroid nodule: Secondary | ICD-10-CM | POA: Diagnosis not present

## 2024-06-06 DIAGNOSIS — N189 Chronic kidney disease, unspecified: Secondary | ICD-10-CM | POA: Insufficient documentation

## 2024-06-06 DIAGNOSIS — Z87440 Personal history of urinary (tract) infections: Secondary | ICD-10-CM | POA: Diagnosis not present

## 2024-06-06 DIAGNOSIS — J069 Acute upper respiratory infection, unspecified: Secondary | ICD-10-CM | POA: Diagnosis not present

## 2024-06-06 DIAGNOSIS — N184 Chronic kidney disease, stage 4 (severe): Secondary | ICD-10-CM | POA: Diagnosis not present

## 2024-06-06 DIAGNOSIS — Z79899 Other long term (current) drug therapy: Secondary | ICD-10-CM | POA: Insufficient documentation

## 2024-06-06 DIAGNOSIS — R809 Proteinuria, unspecified: Secondary | ICD-10-CM | POA: Insufficient documentation

## 2024-06-06 DIAGNOSIS — C8300 Small cell B-cell lymphoma, unspecified site: Secondary | ICD-10-CM

## 2024-06-06 DIAGNOSIS — E211 Secondary hyperparathyroidism, not elsewhere classified: Secondary | ICD-10-CM | POA: Diagnosis not present

## 2024-06-06 DIAGNOSIS — C8305 Small cell B-cell lymphoma, lymph nodes of inguinal region and lower limb: Secondary | ICD-10-CM | POA: Insufficient documentation

## 2024-06-06 DIAGNOSIS — D631 Anemia in chronic kidney disease: Secondary | ICD-10-CM | POA: Insufficient documentation

## 2024-06-06 DIAGNOSIS — I129 Hypertensive chronic kidney disease with stage 1 through stage 4 chronic kidney disease, or unspecified chronic kidney disease: Secondary | ICD-10-CM | POA: Insufficient documentation

## 2024-06-06 DIAGNOSIS — D509 Iron deficiency anemia, unspecified: Secondary | ICD-10-CM | POA: Insufficient documentation

## 2024-06-06 LAB — RENAL FUNCTION PANEL
Albumin: 3.8 g/dL (ref 3.5–5.0)
Anion gap: 8 (ref 5–15)
BUN: 36 mg/dL — ABNORMAL HIGH (ref 8–23)
CO2: 27 mmol/L (ref 22–32)
Calcium: 8.4 mg/dL — ABNORMAL LOW (ref 8.9–10.3)
Chloride: 105 mmol/L (ref 98–111)
Creatinine, Ser: 2.45 mg/dL — ABNORMAL HIGH (ref 0.61–1.24)
GFR, Estimated: 27 mL/min — ABNORMAL LOW (ref >60)
Glucose, Bld: 129 mg/dL — ABNORMAL HIGH (ref 70–99)
Phosphorus: 4.3 mg/dL (ref 2.5–4.6)
Potassium: 4.1 mmol/L (ref 3.5–5.1)
Sodium: 140 mmol/L (ref 135–145)

## 2024-06-06 LAB — COMPREHENSIVE METABOLIC PANEL WITH GFR
ALT: 18 U/L (ref 0–44)
AST: 16 U/L (ref 15–41)
Albumin: 3.8 g/dL (ref 3.5–5.0)
Alkaline Phosphatase: 63 U/L (ref 38–126)
Anion gap: 9 (ref 5–15)
BUN: 36 mg/dL — ABNORMAL HIGH (ref 8–23)
CO2: 27 mmol/L (ref 22–32)
Calcium: 8.5 mg/dL — ABNORMAL LOW (ref 8.9–10.3)
Chloride: 105 mmol/L (ref 98–111)
Creatinine, Ser: 2.48 mg/dL — ABNORMAL HIGH (ref 0.61–1.24)
GFR, Estimated: 26 mL/min — ABNORMAL LOW (ref >60)
Glucose, Bld: 145 mg/dL — ABNORMAL HIGH (ref 70–99)
Potassium: 4.4 mmol/L (ref 3.5–5.1)
Sodium: 141 mmol/L (ref 135–145)
Total Bilirubin: 0.5 mg/dL (ref 0.0–1.2)
Total Protein: 7 g/dL (ref 6.5–8.1)

## 2024-06-06 LAB — CBC WITH DIFFERENTIAL/PLATELET
Abs Immature Granulocytes: 0.02 K/uL (ref 0.00–0.07)
Basophils Absolute: 0.1 K/uL (ref 0.0–0.1)
Basophils Relative: 1 %
Eosinophils Absolute: 0.2 K/uL (ref 0.0–0.5)
Eosinophils Relative: 3 %
HCT: 36.6 % — ABNORMAL LOW (ref 39.0–52.0)
Hemoglobin: 12.5 g/dL — ABNORMAL LOW (ref 13.0–17.0)
Immature Granulocytes: 0 %
Lymphocytes Relative: 24 %
Lymphs Abs: 1.4 K/uL (ref 0.7–4.0)
MCH: 31.8 pg (ref 26.0–34.0)
MCHC: 34.2 g/dL (ref 30.0–36.0)
MCV: 93.1 fL (ref 80.0–100.0)
Monocytes Absolute: 0.6 K/uL (ref 0.1–1.0)
Monocytes Relative: 10 %
Neutro Abs: 3.5 K/uL (ref 1.7–7.7)
Neutrophils Relative %: 62 %
Platelets: 214 K/uL (ref 150–400)
RBC: 3.93 MIL/uL — ABNORMAL LOW (ref 4.22–5.81)
RDW: 12.7 % (ref 11.5–15.5)
WBC: 5.8 K/uL (ref 4.0–10.5)
nRBC: 0 % (ref 0.0–0.2)

## 2024-06-06 LAB — PROTEIN / CREATININE RATIO, URINE
Creatinine, Urine: 123 mg/dL
Total Protein, Urine: >600 mg/dL

## 2024-06-06 LAB — LACTATE DEHYDROGENASE: LDH: 161 U/L (ref 98–192)

## 2024-06-06 LAB — IRON AND TIBC
Iron: 86 ug/dL (ref 45–182)
Saturation Ratios: 32 % (ref 17.9–39.5)
TIBC: 266 ug/dL (ref 250–450)
UIBC: 180 ug/dL

## 2024-06-06 LAB — FERRITIN: Ferritin: 148 ng/mL (ref 24–336)

## 2024-06-07 ENCOUNTER — Other Ambulatory Visit: Payer: Self-pay

## 2024-06-07 ENCOUNTER — Other Ambulatory Visit: Payer: Self-pay | Admitting: Oncology

## 2024-06-07 DIAGNOSIS — C8300 Small cell B-cell lymphoma, unspecified site: Secondary | ICD-10-CM

## 2024-06-07 LAB — PARATHYROID HORMONE, INTACT (NO CA): PTH: 82 pg/mL — ABNORMAL HIGH (ref 15–65)

## 2024-06-07 MED ORDER — ZANUBRUTINIB 160 MG PO TABS
160.0000 mg | ORAL_TABLET | Freq: Two times a day (BID) | ORAL | 0 refills | Status: DC
  Filled 2024-06-07 – 2024-06-12 (×2): qty 60, 30d supply, fill #0

## 2024-06-07 NOTE — Telephone Encounter (Signed)
 Brukinsa  is changing from capsules to tablets.

## 2024-06-10 ENCOUNTER — Telehealth: Payer: Self-pay | Admitting: Pharmacy Technician

## 2024-06-10 ENCOUNTER — Other Ambulatory Visit (HOSPITAL_COMMUNITY): Payer: Self-pay

## 2024-06-10 NOTE — Telephone Encounter (Signed)
 Oral Oncology Patient Advocate Encounter  Was successful in securing patient a $8,000 grant from [facility] to provide copayment coverage for Brukinsa .  This will keep the out of pocket expense at $0.     [employer] ID: [id number]   The billing information is as follows and has been shared with [facility].    RxBin: [id number] PCN: [id number] Member ID: [id number] Group ID: [id number] Dates of Eligibility: 07/11/2024 through 07/10/2025  Fund:  Chronic Lymphocytic Leukemia  Kevin Hooper (Kevin Hooper) Chet Burnet, CPhT  [facility], Zelda Salmon, [facility] - Oral Chemotherapy Patient Advocate Specialist III Phone: [phone]  Fax: [phone]

## 2024-06-11 NOTE — Progress Notes (Unsigned)
 [facility] [address], [place] [address]   CLINIC:  Medical Oncology/Hematology  PCP:  Kevin Isles, MD [address] [place] [address] [phone]   REASON FOR VISIT: Follow-up for stage IIIa SLL  CURRENT THERAPY: Zanubrutinib  160 mg daily  CANCER STAGING: Cancer Staging  Small lymphocytic lymphoma (HCC) Staging form: Hodgkin and Non-Hodgkin Lymphoma, AJCC 8th Edition - Clinical stage from 11/10/2021: Stage III (Small lymphocytic leukemia) - Unsigned  INTERVAL HISTORY:   Kevin Hooper, a 78 y.o. male, returns for routine follow-up of his ***.  Kevin Hooper was last seen on {XX/XX/XXXX}.   In the interim since last visit, he had ***. He  denies any ***other*** recent surgeries, hospitalizations, or changes in baseline health status.  *** At today's visit, he reports feeling ***.  He  reports ***% energy and ***% appetite.   ***He  is maintaining stable weight at this time.  He denies any new lumps or bumps.  *** He  has not had any fatigue.  *** He  has not had any B symptoms or recent infections.  ***   *** Nephrologist?  ***He is taking Zanubrutinib  160 mg twice daily, tolerating this fairly well.  *** ***No abnormal bruising or bleeding. ***Blood pressure at home?  ***  ASSESSMENT & PLAN:  1.  Stage III small lymphocytic lymphoma - He has history of lymphadenopathy in the retroperitoneal region dating back to 2010.   - CT scan from 2010 done at [facility] showed lymphadenopathy and splenomegaly.  - CT for hematuria work-up on 04/05/2018 showed several retroperitoneal lymph nodes measuring 1.2 cm and pelvic lymph nodes bilaterally, largest in the left obturator region measuring 2.2 cm. - PET/CT scan on 05/28/2018 showed very low metabolic activity associated with enlarged pelvic and retroperitoneal and axillary nodes.  Spleen and bone marrow are normal.   - Ultrasound of the abdomen from 05/30/2020 from [facility]  showed normal liver.  Splenomegaly measuring 13.4 x 7.8 x 15.4 cm. - Renal function was worsening around 2023.  Renal ultrasound on 08/09/2021 showed bilateral cortical irregularity consistent with scarring with normal renal echogenicity.  Mild left hydronephrosis. - CT renal study on 09/08/2021: Splenomegaly.  New para-aortic lymphadenopathy.  Hazy retroperitoneum surrounding the kidneys and aortic adenopathy.  Lymph node left of the aorta at the level of the left kidney measures 15 mm enlarged from prior PET scan.  Multiple periaortic lymph nodes similar in size.  Adenopathy extends along the iliac chains with bulky external iliac nodes. - PET scan (10/18/2021): Mildly metabolic adenopathy above and below the diaphragm with mild splenomegaly, worsened since PET scan from 2019.  SUV of the lymph nodes is less than 5.  Left periaortic lymph node at the level of the left kidney measures 1.8 cm in short axis with SUV 3.2, previously measuring 6 mm on PET scan from 2019.  Left inguinal lymph node measures 2.1 cm with SUV 3.8.  Hazy retroperitoneal and mesenteric stranding with low-level associated metabolic activity in the left infrarenal retroperitoneum with SUV 3.1. - Left inguinal lymph node biopsy (11/01/2021): Small lymphocytic lymphoma - CLL FISH panel: Trisomy 12 (neutral prognosis), deletion 13 q. 14 - Indication for treatment = threatened endorgan function (considered that his retroperitoneal/mesenteric stranding is contributed to hydronephrosis and worsening renal function) - Zanubrutinib  160 mg twice daily started on 11/18/2021. - PET scan (02/24/2022): Good response to treatment with significant interval decrease in size of lymphadenopathy in the neck, chest, abdomen  and pelvis and no significant residual hypermetabolic some (Deauville 1).  Stable mild splenomegaly. - PET scan on 01/12/2023 with no signs of tracer avid tumor.  Continued interval decrease in size of the abdominal pelvic lymph nodes.   Borderline enlarged spleen without increase in size. - CT chest without contrast from 02/01/2024: Previously seen 9 mm right lower lobe lung nodule has resolved.  3 mm nodules in the right lower lobe were seen.  Similar appearance of bilateral axillary and mediastinal lymph nodes measuring up to 9 mm. - - - - - - - - - - - - - - - - - - - - - - - - - - - - - - - - - - - - - - - - - - - - - - - - - - - - - - -Tolerating Zanubrutinib  (Brukinsa ) very well.  *** - No B symptoms or recent infections.  *** - Physical exam today (***): *** - Labs (06/06/2024): Hgb 12.5, normal WBC/differential.  Creatinine 2.48/GFR 26.  Normal LDH. - PLAN: Continue Zanubrutinib  160 mg twice daily until progression.  *** - RTC in 4 months for follow-up with labs.  *** - Consider repeat imaging only if clinical condition dictates.  ***  2.  High risk drug monitoring - No bruising or bleeding reported.  *** - Heart rate is regular with no clinical evidence of A-fib at this time.*** - He reports blood pressure at home is close to normal.*** - PLAN: Discussed increased risk of bleeding with BTK inhibitors.  Patient can hold medication 1 week prior to and 1-2 weeks after any future planned surgeries.  3.  Hypertension - He reports blood pressure at home is close to normal.*** - BP in clinic today is *** (reports that he is anxious) *** - PLAN: Continue telmisartan, Toprol -XL, and Norvasc.  *** - *** Hypertension medications are being managed by ***.  4.  CKD stage IV - Creatinine elevation since 2018 - Thought to be related to SLL contributing to hydronephrosis and worsening renal function - He may also have some worsening creatinine from Zanubrutinib  - He is on Farxiga  10 mg daily - Over the past year, creatinine has ranged from 2.23-2.63 - Most recent creatinine (06/06/2024) = 2.45 - He follows with *** for nephrology. - PLAN: ***   5.  Mild normocytic anemia - Combination anemia from CKD and functional iron  deficiency. - He takes iron tablet daily.  *** - Most recent labs (06/06/2024): Hgb 12.5/MCV 93.1.  Baseline CKD stage IV.  Ferritin 148, iron saturation 32%.  Ferritin is 65, percent saturation is 26.   - PLAN: No indication for IV iron or ESA at this time.  Continue iron tablet daily.  6.  Right thyroid  nodule - PET scan showed incidental 1.5 cm thyroid  nodule. - Thyroid  biopsy on 05/19/2023: Benign follicular nodule. - PLAN: No additional follow-up necessary.    PLAN SUMMARY: >> Labs in 4 months = *** >> MD VISIT with Dr. Davonna in 4 months (alternating MD/APP visits)    REVIEW OF SYSTEMS: ***  Review of Systems - Oncology  PHYSICAL EXAM:   Performance status (ECOG): {CHL ONC ED:8845999799} *** There were no vitals filed for this visit. Wt Readings from Last 3 Encounters:  02/08/24 185 lb 3.2 oz (84 kg)  11/09/23 182 lb 9.6 oz (82.8 kg)  08/10/23 188 lb 14.4 oz (85.7 kg)   Physical Exam   PAST MEDICAL/SURGICAL HISTORY:  Past Medical History:  Diagnosis  Date   Arthritis    Cataracts, bilateral 11/2016   Chronic kidney disease    sTAGE 3   Diabetes mellitus without complication (HCC)    Gout    Hyperlipidemia    Hypertension    Joint ache    Prostate atrophy 2018   Past Surgical History:  Procedure Laterality Date   COLON RESECTION  2015   EXCISION OF SKIN TAG Left 11/01/2021   Procedure: EXCISION OF SKIN TAG;  Surgeon: Kallie Manuelita BROCKS, MD;  Location: [facility];  Service: General;  Laterality: Left;   HERNIA REPAIR     UMBILICAL   INGUINAL LYMPH NODE BIOPSY Left 11/01/2021   Procedure: INGUINAL LYMPH NODE BIOPSY- LEFT;  Surgeon: Kallie Manuelita BROCKS, MD;  Location: [facility];  Service: General;  Laterality: Left;   LUMBAR FUSION  1990   OTHER SURGICAL HISTORY Left    Arm s/p accident   REPLACEMENT TOTAL KNEE BILATERAL Bilateral 1990   TRANSURETHRAL RESECTION OF PROSTATE N/A 07/04/2017   Procedure: TRANSURETHRAL RESECTION OF THE PROSTATE (TURP);  Surgeon: Watt Rush, MD;  Location: [facility];  Service: Urology;  Laterality: N/A;    SOCIAL HISTORY:  Social History   Socioeconomic History   Marital status: Married    Spouse name: Not on file   Number of children: 2   Years of education: 12+   Highest education level: Not on file  Occupational History   Occupation: Retired  Tobacco Use   Smoking status: Former    Current packs/day: 0.00    Average packs/day: 2.0 packs/day for 25.0 years (50.0 ttl pk-yrs)    Types: Cigarettes    Start date: 04/29/1967    Quit date: 04/28/1992    Years since quitting: 32.1   Smokeless tobacco: Never  Vaping Use   Vaping status: Never Used  Substance and Sexual Activity   Alcohol  use: Yes    Comment: Occasional   Drug use: No   Sexual activity: Not on file    Comment: Married  Other Topics Concern   Not on file  Social History Narrative   Lives at home w/ his wife   Right-handed   Caffeine: occasional tea   Social Drivers of Corporate Investment Banker Strain: Low Risk  (06/16/2020)   Overall Financial Resource Strain (CARDIA)    Difficulty of Paying Living Expenses: Not hard at all  Food Insecurity: No Food Insecurity (06/16/2020)   Hunger Vital Sign    Worried About Running Out of Food in the Last Year: Never true    Ran Out of Food in the Last Year: Never true  Transportation Needs: No Transportation Needs (06/16/2020)   PRAPARE - Administrator, Civil Service (Medical): No    Lack of Transportation (Non-Medical): No  Physical Activity: Insufficiently Active (06/16/2020)   Exercise Vital Sign    Days of Exercise per Week: 7 days    Minutes of Exercise per Session: 20 min  Stress: No Stress Concern Present (06/16/2020)   [employer] of Occupational Health - Occupational Stress Questionnaire    Feeling of Stress : Not at all  Social Connections: Moderately Integrated (06/16/2020)   Social Connection and Isolation Panel    Frequency of Communication with Friends and Family:  More than three times a week    Frequency of Social Gatherings with Friends and Family: Twice a week    Attends Religious Services: More than 4 times per year    Active Member of [employer] or Organizations:  No    Attends Club or Organization Meetings: Never    Marital Status: Married  Catering Manager Violence: Not At Risk (06/16/2020)   Humiliation, Afraid, Rape, and Kick questionnaire    Fear of Current or Ex-Partner: No    Emotionally Abused: No    Physically Abused: No    Sexually Abused: No    FAMILY HISTORY:  Family History  Problem Relation Age of Onset   COPD Father    Throat cancer Father    Cancer Brother     CURRENT MEDICATIONS:  Current Outpatient Medications  Medication Sig Dispense Refill   acetaminophen  (TYLENOL ) 650 MG CR tablet Take 650 mg by mouth every 8 (eight) hours as needed (Arthritis).     albuterol  (PROVENTIL  HFA;VENTOLIN  HFA) 108 (90 Base) MCG/ACT inhaler Inhale 2 puffs into the lungs every 6 (six) hours as needed for wheezing or shortness of breath.      allopurinol  (ZYLOPRIM ) 300 MG tablet TAKE ONE TABLET BY MOUTH ONCE DAILY 30 tablet 3   amLODipine (NORVASC) 10 MG tablet Take 10 mg by mouth daily.     b complex vitamins capsule Take 1 capsule by mouth daily. Folic acid      Blood Glucose Monitoring Suppl (ONETOUCH VERIO FLEX SYSTEM) w/Device KIT daily.     calcitRIOL (ROCALTROL) 0.25 MCG capsule Take 0.25 mcg by mouth 3 (three) times a week.     Cyanocobalamin  (VITAMIN B-12 PO) Take by mouth.     FARXIGA  10 MG TABS tablet Take 1 tablet (10 mg total) by mouth daily. 30 tablet 0   glucosamine-chondroitin 500-400 MG tablet Take 1 tablet by mouth daily.     Lancets (ONETOUCH DELICA PLUS LANCET33G) MISC See admin instructions.     MEGARED OMEGA-3 KRILL OIL PO Take by mouth daily.     metoprolol  succinate (TOPROL -XL) 100 MG 24 hr tablet Take 100 mg by mouth daily.   1   Multiple Vitamins-Minerals (MULTIVITAMIN WITH MINERALS) tablet Take 1 tablet by mouth daily.      Omega-3 Fatty Acids (OMEGA-3 FISH OIL PO) Take by mouth.     ONETOUCH VERIO test strip 1 each daily.     rosuvastatin (CRESTOR) 5 MG tablet Take 5 mg by mouth at bedtime.     telmisartan (MICARDIS) 20 MG tablet Take 20 mg by mouth as directed. Tues-Thurs-Sat-Sun     vitamin C (ASCORBIC ACID) 500 MG tablet Take 1,000 mg by mouth daily.     zanubrutinib  (BRUKINSA ) 160 MG tablet Take 1 tablet (160 mg total) by mouth 2 (two) times daily. 60 tablet 0   No current facility-administered medications for this visit.    ALLERGIES:  No Known Allergies  LABORATORY DATA:  I have reviewed the labs as listed.     Latest Ref Rng & Units 06/06/2024    1:57 PM 02/01/2024    1:58 PM 11/02/2023    2:01 PM  CBC  WBC 4.0 - 10.5 K/uL 5.8  7.1  5.9   Hemoglobin 13.0 - 17.0 g/dL 87.4  84.9  87.8   Hematocrit 39.0 - 52.0 % 36.6  44.2  36.6   Platelets 150 - 400 K/uL 214  242  235       Latest Ref Rng & Units 06/06/2024    2:14 PM 06/06/2024    1:57 PM 02/01/2024    1:58 PM  CMP  Glucose 70 - 99 mg/dL 870  854  814   BUN 8 - 23 mg/dL 36  36  42  Creatinine 0.61 - 1.24 mg/dL 7.54  7.51  7.36   Sodium 135 - 145 mmol/L 140  141  137   Potassium 3.5 - 5.1 mmol/L 4.1  4.4  4.0   Chloride 98 - 111 mmol/L 105  105  103   CO2 22 - 32 mmol/L 27  27  24    Calcium 8.9 - 10.3 mg/dL 8.4  8.5  8.7   Total Protein 6.5 - 8.1 g/dL  7.0  6.9   Total Bilirubin 0.0 - 1.2 mg/dL  0.5  0.7   Alkaline Phos 38 - 126 U/L  63  60   AST 15 - 41 U/L  16  12   ALT 0 - 44 U/L  18  16     DIAGNOSTIC IMAGING:  I have independently reviewed the scans and discussed with the patient. No results found.   WRAP UP:  All questions were answered. The patient knows to call the clinic with any problems, questions or concerns.  Medical decision making: ***  Time spent on visit: I spent {CHL ONC TIME VISIT - DTPQU:[id number]} counseling the patient face to face. The total time spent in the appointment was {CHL ONC TIME VISIT -  DTPQU:[id number]} and more than 50% was on counseling.  Pleasant CHRISTELLA Barefoot, PA-C  ***

## 2024-06-12 ENCOUNTER — Inpatient Hospital Stay (HOSPITAL_BASED_OUTPATIENT_CLINIC_OR_DEPARTMENT_OTHER): Admitting: Physician Assistant

## 2024-06-12 ENCOUNTER — Ambulatory Visit: Payer: Self-pay | Admitting: Physician Assistant

## 2024-06-12 ENCOUNTER — Ambulatory Visit (HOSPITAL_COMMUNITY)
Admission: RE | Admit: 2024-06-12 | Discharge: 2024-06-12 | Disposition: A | Discharge status: Home / Self Care | Source: Ambulatory Visit | Attending: Physician Assistant | Admitting: Physician Assistant

## 2024-06-12 ENCOUNTER — Other Ambulatory Visit: Payer: Self-pay

## 2024-06-12 VITALS — BP 140/68 | HR 93 | Temp 99.6°F | Resp 19 | Ht 68.5 in | Wt 190.0 lb

## 2024-06-12 DIAGNOSIS — C8300 Small cell B-cell lymphoma, unspecified site: Secondary | ICD-10-CM | POA: Diagnosis not present

## 2024-06-12 DIAGNOSIS — N184 Chronic kidney disease, stage 4 (severe): Secondary | ICD-10-CM

## 2024-06-12 DIAGNOSIS — J069 Acute upper respiratory infection, unspecified: Secondary | ICD-10-CM | POA: Insufficient documentation

## 2024-06-12 DIAGNOSIS — C8305 Small cell B-cell lymphoma, lymph nodes of inguinal region and lower limb: Secondary | ICD-10-CM | POA: Diagnosis not present

## 2024-06-12 DIAGNOSIS — R918 Other nonspecific abnormal finding of lung field: Secondary | ICD-10-CM | POA: Diagnosis not present

## 2024-06-12 DIAGNOSIS — D631 Anemia in chronic kidney disease: Secondary | ICD-10-CM

## 2024-06-12 DIAGNOSIS — R059 Cough, unspecified: Secondary | ICD-10-CM | POA: Diagnosis not present

## 2024-06-12 DIAGNOSIS — R509 Fever, unspecified: Secondary | ICD-10-CM | POA: Diagnosis not present

## 2024-06-12 DIAGNOSIS — N189 Chronic kidney disease, unspecified: Secondary | ICD-10-CM | POA: Diagnosis not present

## 2024-06-12 MED ORDER — AMOXICILLIN-POT CLAVULANATE 875-125 MG PO TABS: 1.0000 | ORAL_TABLET | Freq: Two times a day (BID) | ORAL | 0 refills | Status: AC

## 2024-06-12 NOTE — Patient Instructions (Signed)
 [facility] **VISIT SUMMARY & IMPORTANT INSTRUCTIONS **   You were seen today by Pleasant Barefoot PA-C for your follow-up visit.    UPPER RESPIRATORY INFECTION Chest x-ray today Take Augmentin antibiotic twice daily x 10 days. If you do not feel better within the next 3 to 4 days, or if you start to get worse, please go to urgent care and/or your primary care provider.  If any severe symptoms, proceed to the emergency department.  SMALL LYMPHOCYTIC LYMPHOMA Continue Brukinsa  160 mg twice daily We will check labs and see her for follow-up visit in 4 months.  BRUKINSA  SIDE EFFECTS Watch for any abnormal bleeding. Take your blood pressure medications, and check your blood pressure at least once a week.  FOLLOW-UP APPOINTMENT: 4 months  ** Thank you for trusting me with your healthcare!  I strive to provide all of my patients with quality care at each visit.  If you receive a survey for this visit, I would be so grateful to you for taking the time to provide feedback.  Thank you in advance!  ~ Lorrain Rivers                                        Dr. Mickiel Davonna Pleasant Barefoot, PA-C          Delon Hope, NP   - - - - - - - - - - - - - - - - - -    Thank you for choosing  [facility] to provide your oncology and hematology care.  To afford each patient quality time with our provider, please arrive at least 15 minutes before your scheduled appointment time.   If you have a lab appointment with the Cancer Center please come in thru the Main Entrance and check in at the main information desk.  You need to re-schedule your appointment should you arrive 10 or more minutes late.  We strive to give you quality time with our providers, and arriving late affects you and other patients whose appointments are after yours.  Also, if you no show three or more times for appointments you may be dismissed from the clinic  at the providers discretion.     Again, thank you for choosing [facility].  Our hope is that these requests will decrease the amount of time that you wait before being seen by our physicians.       _____________________________________________________________  Should you have questions after your visit to [facility], please contact our office at [phone] and follow the prompts.  Our office hours are 8:00 a.m. and 4:30 p.m. Monday - Friday.  Please note that voicemails left after 4:00 p.m. may not be returned until the following business day.  We are closed weekends and major holidays.  You do have access to a nurse 24-7, just call the main number to the clinic [phone] and do not press any options, hold on the line and a nurse will answer the phone.    For prescription refill requests, have your pharmacy contact our office and allow 72 hours.

## 2024-06-12 NOTE — Progress Notes (Signed)
 Specialty Pharmacy Refill Coordination Note  Kevin Hooper is a 78 y.o. male contacted today regarding refills of specialty medication(s) Zanubrutinib  (BRUKINSA )   Patient requested Delivery   Delivery date: 06/18/24   Verified address: [address] [place] [address]   Medication will be filled on: 06/17/24  Notified patient of Brukinsa  formulation change. Patient aware Brukinsa  tablets are 160mg  and he will take 1 tab bid.

## 2024-06-13 ENCOUNTER — Other Ambulatory Visit: Payer: Self-pay

## 2024-06-13 DIAGNOSIS — C8300 Small cell B-cell lymphoma, unspecified site: Secondary | ICD-10-CM | POA: Diagnosis not present

## 2024-06-13 DIAGNOSIS — N184 Chronic kidney disease, stage 4 (severe): Secondary | ICD-10-CM | POA: Diagnosis not present

## 2024-06-13 DIAGNOSIS — N139 Obstructive and reflux uropathy, unspecified: Secondary | ICD-10-CM | POA: Diagnosis not present

## 2024-06-13 DIAGNOSIS — I129 Hypertensive chronic kidney disease with stage 1 through stage 4 chronic kidney disease, or unspecified chronic kidney disease: Secondary | ICD-10-CM | POA: Diagnosis not present

## 2024-06-17 ENCOUNTER — Other Ambulatory Visit: Payer: Self-pay

## 2024-06-18 ENCOUNTER — Other Ambulatory Visit: Payer: Self-pay

## 2024-06-18 NOTE — Progress Notes (Signed)
 Specialty Pharmacy Ongoing Clinical Assessment Note  Kevin Hooper is a 78 y.o. male who is being followed by the specialty pharmacy service for RxSp Oncology   Patient's specialty medication(s) reviewed today: Zanubrutinib  (BRUKINSA )   Missed doses in the last 4 weeks: 0   Patient/Caregiver did not have any additional questions or concerns.   Therapeutic benefit summary: Patient is achieving benefit   Adverse events/side effects summary: No adverse events/side effects   Patient's therapy is appropriate to: Continue    Goals Addressed             This Visit's Progress    Slow Disease Progression   On track    Patient is on track. Patient will maintain adherence. Last CT chest without contrast from 02/01/2024 showed mixed results (previously seen 9 mm right lower lobe lung nodule has resolved.  3 mm nodules in the right lower lobe were seen.  Similar appearance of bilateral axillary and mediastinal lymph nodes measuring up to 9 mm.)  Provider plans to continue treatment until there is clear progression.          Follow up: 6 months  Silvano LOISE Dolly Specialty Pharmacist

## 2024-06-19 DIAGNOSIS — Z299 Encounter for prophylactic measures, unspecified: Secondary | ICD-10-CM | POA: Diagnosis not present

## 2024-06-19 DIAGNOSIS — C8590 Non-Hodgkin lymphoma, unspecified, unspecified site: Secondary | ICD-10-CM | POA: Diagnosis not present

## 2024-06-19 DIAGNOSIS — J449 Chronic obstructive pulmonary disease, unspecified: Secondary | ICD-10-CM | POA: Diagnosis not present

## 2024-06-19 DIAGNOSIS — E1169 Type 2 diabetes mellitus with other specified complication: Secondary | ICD-10-CM | POA: Diagnosis not present

## 2024-06-19 DIAGNOSIS — J069 Acute upper respiratory infection, unspecified: Secondary | ICD-10-CM | POA: Diagnosis not present

## 2024-06-24 DIAGNOSIS — Z299 Encounter for prophylactic measures, unspecified: Secondary | ICD-10-CM | POA: Diagnosis not present

## 2024-06-24 DIAGNOSIS — J441 Chronic obstructive pulmonary disease with (acute) exacerbation: Secondary | ICD-10-CM | POA: Diagnosis not present

## 2024-06-24 DIAGNOSIS — E1122 Type 2 diabetes mellitus with diabetic chronic kidney disease: Secondary | ICD-10-CM | POA: Diagnosis not present

## 2024-06-24 DIAGNOSIS — I10 Essential (primary) hypertension: Secondary | ICD-10-CM | POA: Diagnosis not present

## 2024-06-24 DIAGNOSIS — N183 Chronic kidney disease, stage 3 unspecified: Secondary | ICD-10-CM | POA: Diagnosis not present

## 2024-06-24 DIAGNOSIS — E1169 Type 2 diabetes mellitus with other specified complication: Secondary | ICD-10-CM | POA: Diagnosis not present

## 2024-07-03 ENCOUNTER — Other Ambulatory Visit (HOSPITAL_COMMUNITY): Payer: Self-pay | Admitting: Internal Medicine

## 2024-07-03 ENCOUNTER — Ambulatory Visit (HOSPITAL_COMMUNITY)
Admission: RE | Admit: 2024-07-03 | Discharge: 2024-07-03 | Disposition: A | Source: Ambulatory Visit | Attending: Internal Medicine | Admitting: Internal Medicine

## 2024-07-03 DIAGNOSIS — E1169 Type 2 diabetes mellitus with other specified complication: Secondary | ICD-10-CM | POA: Diagnosis not present

## 2024-07-03 DIAGNOSIS — J441 Chronic obstructive pulmonary disease with (acute) exacerbation: Secondary | ICD-10-CM | POA: Diagnosis not present

## 2024-07-03 DIAGNOSIS — R52 Pain, unspecified: Secondary | ICD-10-CM | POA: Diagnosis not present

## 2024-07-03 DIAGNOSIS — I10 Essential (primary) hypertension: Secondary | ICD-10-CM | POA: Diagnosis not present

## 2024-07-03 DIAGNOSIS — R059 Cough, unspecified: Secondary | ICD-10-CM

## 2024-07-03 DIAGNOSIS — Z299 Encounter for prophylactic measures, unspecified: Secondary | ICD-10-CM | POA: Diagnosis not present

## 2024-07-03 DIAGNOSIS — C9110 Chronic lymphocytic leukemia of B-cell type not having achieved remission: Secondary | ICD-10-CM | POA: Diagnosis not present

## 2024-07-09 ENCOUNTER — Other Ambulatory Visit: Payer: Self-pay

## 2024-07-09 ENCOUNTER — Other Ambulatory Visit: Payer: Self-pay | Admitting: Oncology

## 2024-07-09 DIAGNOSIS — C8300 Small cell B-cell lymphoma, unspecified site: Secondary | ICD-10-CM

## 2024-07-10 ENCOUNTER — Other Ambulatory Visit: Payer: Self-pay

## 2024-07-10 ENCOUNTER — Other Ambulatory Visit: Payer: Self-pay | Admitting: Pharmacy Technician

## 2024-07-10 MED ORDER — ZANUBRUTINIB 160 MG PO TABS
160.0000 mg | ORAL_TABLET | Freq: Two times a day (BID) | ORAL | 0 refills | Status: AC
  Filled 2024-07-10 (×2): qty 60, 30d supply, fill #0

## 2024-07-10 NOTE — Progress Notes (Signed)
 Specialty Pharmacy Refill Coordination Note  Kevin Hooper is a 78 y.o. male contacted today regarding refills of specialty medication(s) Zanubrutinib  (BRUKINSA )   Patient requested (Patient-Rptd) Delivery   Delivery date: 07-16-24  Verified address: (Patient-Rptd) [address] [place] [address]   Medication will be filled on: 07-15-24

## 2024-07-10 NOTE — Telephone Encounter (Signed)
 SABRA

## 2024-07-15 ENCOUNTER — Other Ambulatory Visit: Payer: Self-pay

## 2024-07-16 ENCOUNTER — Other Ambulatory Visit (HOSPITAL_COMMUNITY)
Admission: RE | Admit: 2024-07-16 | Discharge: 2024-07-16 | Disposition: A | Discharge status: Home / Self Care | Source: Ambulatory Visit | Attending: Nephrology | Admitting: Nephrology

## 2024-07-16 DIAGNOSIS — D631 Anemia in chronic kidney disease: Secondary | ICD-10-CM | POA: Insufficient documentation

## 2024-07-16 DIAGNOSIS — R809 Proteinuria, unspecified: Secondary | ICD-10-CM | POA: Diagnosis not present

## 2024-07-16 DIAGNOSIS — N189 Chronic kidney disease, unspecified: Secondary | ICD-10-CM | POA: Diagnosis present

## 2024-07-16 LAB — RENAL FUNCTION PANEL
Albumin: 3.5 g/dL (ref 3.5–5.0)
Anion gap: 5 (ref 5–15)
BUN: 41 mg/dL — ABNORMAL HIGH (ref 8–23)
CO2: 27 mmol/L (ref 22–32)
Calcium: 8.5 mg/dL — ABNORMAL LOW (ref 8.9–10.3)
Chloride: 107 mmol/L (ref 98–111)
Creatinine, Ser: 2.68 mg/dL — ABNORMAL HIGH (ref 0.61–1.24)
GFR, Estimated: 24 mL/min — ABNORMAL LOW (ref >60)
Glucose, Bld: 133 mg/dL — ABNORMAL HIGH (ref 70–99)
Phosphorus: 3.7 mg/dL (ref 2.5–4.6)
Potassium: 4.6 mmol/L (ref 3.5–5.1)
Sodium: 139 mmol/L (ref 135–145)

## 2024-07-29 ENCOUNTER — Encounter: Payer: Self-pay | Admitting: *Deleted

## 2024-08-07 ENCOUNTER — Other Ambulatory Visit: Payer: Self-pay | Admitting: Oncology

## 2024-08-07 ENCOUNTER — Other Ambulatory Visit: Payer: Self-pay

## 2024-08-07 DIAGNOSIS — C8300 Small cell B-cell lymphoma, unspecified site: Secondary | ICD-10-CM

## 2024-08-08 ENCOUNTER — Other Ambulatory Visit: Payer: Self-pay

## 2024-08-14 ENCOUNTER — Other Ambulatory Visit: Payer: Self-pay

## 2024-08-14 ENCOUNTER — Other Ambulatory Visit: Payer: Self-pay | Admitting: Oncology

## 2024-08-14 DIAGNOSIS — C8300 Small cell B-cell lymphoma, unspecified site: Secondary | ICD-10-CM

## 2024-08-14 NOTE — Progress Notes (Signed)
 Specialty Pharmacy Refill Coordination Note  Kevin Hooper is a 79 y.o. male, patients wife was contacted today regarding refills of specialty medication(s) Zanubrutinib  (BRUKINSA )   Patient requested Delivery   Delivery date: 08/28/24   Verified address: [address] [place] [address]   Medication will be filled on: 08/27/24    This fill date is pending response to refill request from provider. Patient is aware and if they have not received fill by intended date they must follow up with pharmacy.

## 2024-08-27 ENCOUNTER — Other Ambulatory Visit: Payer: Self-pay | Admitting: Oncology

## 2024-08-27 ENCOUNTER — Other Ambulatory Visit: Payer: Self-pay

## 2024-08-27 DIAGNOSIS — C8300 Small cell B-cell lymphoma, unspecified site: Secondary | ICD-10-CM

## 2024-08-27 MED ORDER — ZANUBRUTINIB 160 MG PO TABS
160.0000 mg | ORAL_TABLET | Freq: Two times a day (BID) | ORAL | 2 refills | Status: AC
  Filled 2024-08-27: qty 60, 30d supply, fill #0

## 2024-08-27 NOTE — Telephone Encounter (Signed)
 Chart reviewed. Brukinsa  refilled per last office note with Pleasant Barefoot, PA.

## 2024-08-28 ENCOUNTER — Other Ambulatory Visit: Payer: Self-pay

## 2024-08-28 NOTE — Progress Notes (Signed)
 Patient wife called asking if we can ship to another address due to them not being at that address due to the weather. Patient aware that change was made & completed. Call has been made to [employer]. Address shipping to [address] [place] [address] & also asked [employer] to call Patient if any issues.

## 2024-08-29 ENCOUNTER — Other Ambulatory Visit: Payer: Self-pay

## 2024-09-05 ENCOUNTER — Other Ambulatory Visit (HOSPITAL_COMMUNITY)
Admission: RE | Admit: 2024-09-05 | Discharge: 2024-09-05 | Disposition: A | Source: Ambulatory Visit | Attending: Nephrology | Admitting: Nephrology

## 2024-09-05 LAB — RENAL FUNCTION PANEL
Albumin: 3.4 g/dL — ABNORMAL LOW (ref 3.5–5.0)
Anion gap: 11 (ref 5–15)
BUN: 39 mg/dL — ABNORMAL HIGH (ref 8–23)
CO2: 24 mmol/L (ref 22–32)
Calcium: 8.4 mg/dL — ABNORMAL LOW (ref 8.9–10.3)
Chloride: 106 mmol/L (ref 98–111)
Creatinine, Ser: 2.66 mg/dL — ABNORMAL HIGH (ref 0.61–1.24)
GFR, Estimated: 24 mL/min — ABNORMAL LOW
Glucose, Bld: 127 mg/dL — ABNORMAL HIGH (ref 70–99)
Phosphorus: 5.1 mg/dL — ABNORMAL HIGH (ref 2.5–4.6)
Potassium: 4.4 mmol/L (ref 3.5–5.1)
Sodium: 141 mmol/L (ref 135–145)

## 2024-09-05 LAB — CBC
HCT: 35.9 % — ABNORMAL LOW (ref 39.0–52.0)
Hemoglobin: 11.9 g/dL — ABNORMAL LOW (ref 13.0–17.0)
MCH: 30.8 pg (ref 26.0–34.0)
MCHC: 33.1 g/dL (ref 30.0–36.0)
MCV: 93 fL (ref 80.0–100.0)
Platelets: 218 10*3/uL (ref 150–400)
RBC: 3.86 MIL/uL — ABNORMAL LOW (ref 4.22–5.81)
RDW: 13.7 % (ref 11.5–15.5)
WBC: 5.9 10*3/uL (ref 4.0–10.5)
nRBC: 0 % (ref 0.0–0.2)

## 2024-09-05 LAB — PROTEIN / CREATININE RATIO, URINE
Creatinine, Urine: 120 mg/dL
Protein Creatinine Ratio: 4 mg/mg — ABNORMAL HIGH
Total Protein, Urine: 485 mg/dL

## 2024-09-06 LAB — PARATHYROID HORMONE, INTACT (NO CA): PTH: 72 pg/mL — ABNORMAL HIGH (ref 15–65)

## 2024-10-09 ENCOUNTER — Inpatient Hospital Stay

## 2024-10-11 ENCOUNTER — Inpatient Hospital Stay: Payer: Self-pay | Attending: Physician Assistant

## 2024-10-16 ENCOUNTER — Inpatient Hospital Stay: Admitting: Physician Assistant

## 2024-10-18 ENCOUNTER — Inpatient Hospital Stay: Admitting: Oncology
